# Patient Record
Sex: Male | Born: 1945 | Race: White | Hispanic: No | Marital: Married | Smoking: Former smoker
Health system: Southern US, Community
[De-identification: ages and names within clinical notes are randomized; demographics above are authoritative.]

## PROBLEM LIST (undated history)

## (undated) DIAGNOSIS — C801 Malignant (primary) neoplasm, unspecified: Secondary | ICD-10-CM

## (undated) DIAGNOSIS — I499 Cardiac arrhythmia, unspecified: Secondary | ICD-10-CM

## (undated) DIAGNOSIS — F4310 Post-traumatic stress disorder, unspecified: Secondary | ICD-10-CM

## (undated) DIAGNOSIS — Z87442 Personal history of urinary calculi: Secondary | ICD-10-CM

## (undated) DIAGNOSIS — M1990 Unspecified osteoarthritis, unspecified site: Secondary | ICD-10-CM

## (undated) DIAGNOSIS — I480 Paroxysmal atrial fibrillation: Secondary | ICD-10-CM

## (undated) DIAGNOSIS — I2510 Atherosclerotic heart disease of native coronary artery without angina pectoris: Secondary | ICD-10-CM

## (undated) HISTORY — DX: Paroxysmal atrial fibrillation: I48.0

## (undated) HISTORY — DX: Atherosclerotic heart disease of native coronary artery without angina pectoris: I25.10

## (undated) HISTORY — PX: BACK SURGERY: SHX140

## (undated) HISTORY — PX: CARPAL TUNNEL RELEASE: SHX101

## (undated) HISTORY — PX: JOINT REPLACEMENT: SHX530

---

## 2001-05-15 ENCOUNTER — Encounter: Admission: RE | Admit: 2001-05-15 | Discharge: 2001-05-15 | Payer: Self-pay | Admitting: Orthopaedic Surgery

## 2001-05-15 ENCOUNTER — Encounter: Payer: Self-pay | Admitting: Orthopaedic Surgery

## 2001-05-30 ENCOUNTER — Encounter: Admission: RE | Admit: 2001-05-30 | Discharge: 2001-05-30 | Payer: Self-pay | Admitting: Orthopaedic Surgery

## 2001-05-30 ENCOUNTER — Encounter: Payer: Self-pay | Admitting: Orthopaedic Surgery

## 2001-06-12 ENCOUNTER — Encounter: Payer: Self-pay | Admitting: Orthopaedic Surgery

## 2001-06-12 ENCOUNTER — Encounter: Admission: RE | Admit: 2001-06-12 | Discharge: 2001-06-12 | Payer: Self-pay | Admitting: Orthopaedic Surgery

## 2001-09-11 ENCOUNTER — Ambulatory Visit (HOSPITAL_COMMUNITY): Admission: RE | Admit: 2001-09-11 | Discharge: 2001-09-11 | Payer: Self-pay | Admitting: Orthopaedic Surgery

## 2001-11-20 ENCOUNTER — Inpatient Hospital Stay (HOSPITAL_COMMUNITY): Admission: RE | Admit: 2001-11-20 | Discharge: 2001-11-21 | Payer: Self-pay | Admitting: Neurosurgery

## 2001-11-20 ENCOUNTER — Encounter: Payer: Self-pay | Admitting: Neurosurgery

## 2003-10-21 ENCOUNTER — Inpatient Hospital Stay (HOSPITAL_COMMUNITY): Admission: RE | Admit: 2003-10-21 | Discharge: 2003-10-23 | Payer: Self-pay | Admitting: Orthopaedic Surgery

## 2003-12-06 ENCOUNTER — Emergency Department (HOSPITAL_COMMUNITY): Admission: EM | Admit: 2003-12-06 | Discharge: 2003-12-06 | Payer: Self-pay | Admitting: Emergency Medicine

## 2010-01-18 ENCOUNTER — Encounter: Admission: RE | Admit: 2010-01-18 | Discharge: 2010-01-18 | Payer: Self-pay | Admitting: Orthopaedic Surgery

## 2010-05-07 HISTORY — PX: SHOULDER ARTHROSCOPY W/ ROTATOR CUFF REPAIR: SHX2400

## 2010-09-22 NOTE — Op Note (Signed)
[place]  Patient:    [name], [name] Visit Number: [id number] MRN: [id number]          Service Type: SUR Location: [id number] Attending Physician:  Barton Fanny Dictated by:   Hewitt Shorts, M.D. Proc. Date: 11/20/01 Admit Date:  11/20/2001 Discharge Date: 11/21/2001                             Operative Report  PREOPERATIVE DIAGNOSIS:  Lumbar stenosis.  POSTOPERATIVE DIAGNOSIS:  Lumbar stenosis and lumbar synovial cyst.  PROCEDURE:  L4 and L5 lumbar laminectomies, resection of lumbar synovial cyst, and microdissection.  SURGEON:  Hewitt Shorts, M.D.  ASSISTANT:  Cristi Loron, M.D.  ANESTHESIA:  General endotracheal.  INDICATIONS:  The patient is a 65 year old man who presented with neurogenic claudication.  He was found by MRI scan to have evidence of lumbar stenosis. He failed conservative treatment and was admitted for surgical decompression. Intraoperatively, in addition to spinal leak degeneration, we did find a synovial cyst on the left side contributing to the stenosis and this was removed.  DESCRIPTION OF PROCEDURE:  The patient was brought to the operating room and placed under general endotracheal anesthesia.  The patient was turned to a prone position, the lumbar region prepped with Betadine soap and solution, and draped in a sterile fashion.  A vertical midline incision was made.  The line of the incision was infiltrated with local anesthetic with epinephrine. Dissection was carried down through the subcutaneous tissue and bipolar cautery and electrocautery were used to maintain hemostasis.  Dissection was carried down to the lumbar fascia which was incised bilaterally with the paraspinous muscles of the spinous process and lamina in a subperiosteal fashion.  An x-ray was taken to localize the L4 and L5 spinous process and lamina, and then a laminectomy was performed using double action  rongeurs with the black Max drill and Kerrison punches.  We found the ligamentum flavum to be thickened.  The microscope was draped and brought into the field to provide instant magnification, illumination, and visualization, and the decompression of the thecal sac was performed with microsurgical and microdissection technique.  Both ligament and bone was removed on the left side.  We encountered a synovial cyst that was carefully removed and foraminotomies were performed bilaterally to the L5 nerve root.  There was moderate epidural scarring, probably due to the epidural steroid injections she had previously undergone.  Once the laminectomy and resection of the synovial cyst and decompression of the stenosis was completed, the wound was irrigated with Bacitracin solution, and checked for hemostasis.  The laminectomy defect was closed with Gelfoam soaked in thrombin.  Once hemostasis was established and confirmed, we proceeded with closure.  The paraspinal muscles were approximated with interrupted undyed 1 Vicryl sutures, the deep fascia closed with interrupted undyed 1 Vicryl suture, the subcutaneous and subcuticular closed with interrupted and inverted 2-0 undyed Vicryl sutures, and the skin was reapproximated with Dermabond.  The patient tolerated the procedure well.  Estimated blood loss was 100 cc. Sponge and needle count correct.  Following surgery, the patient was returned back to the supine position to be reversed from the anesthetic, extubated, and transferred to the recovery room for further care. Dictated by:   Hewitt Shorts, M.D. Attending Physician:  Barton Fanny DD:  11/20/01 TD:  11/25/01 Job: [id number]

## 2010-09-22 NOTE — H&P (Signed)
NAME:  [name], [name]                        ACCOUNT NO.:  [id number]   MEDICAL RECORD NO.:  [id number]                   PATIENT TYPE:  INP   LOCATION:                                       FACILITY:  [facility]   PHYSICIAN:  Legrand Pitts. Duffy, P.A.                DATE OF BIRTH:  09-14-1945   DATE OF ADMISSION:  10/21/2003  DATE OF DISCHARGE:                                HISTORY & PHYSICAL   CHIEF COMPLAINT:  Left knee pain for the last two years.   HISTORY OF PRESENT ILLNESS:  This 65 year old white male patient presents to  Dr. Claude Manges. [place] with a two-year history of gradual onset of  progressively worsening left knee pain.  He has a history of a left-knee  arthroscopy by Dr. Cleophas Dunker on March 25 at 2004.  He had arthritic changes  noted in the knee at that time.  Since that surgery, the pain in the knee  has gotten worse.  He is also having more difficulty with range of motion  and activities that he wishes to proceed with.   He has had no known injury to his knee.  The pain at this time is a diffuse,  constant throbbing or burning sensation diffuse about the joint with some  radiation into the proximal tibia.  The pain increases with prolonged  standing, and decreases with elevation of the knee.  The knee does pop and  swell.  He is having some difficulty falling asleep at times.  The knee also  has locked up on him once or twice.  There is no grinding, catching or  giving away of the knee.  He does not ambulate with any assistive devices.  He takes Aleve p.r.n. for the pain, and that provides some relief.  He has  received cortisone injections and Hyalgan injections in the past, and he  thought the Hyalgan even made the knee worse, whereas the cortisone did  allow some momentary pain relief.   ALLERGIES:  No known drug allergies.   CURRENT MEDICATIONS:  1. Aleve two to three tablets p.r.n. pain.  2. Multivitamin two tablets p.o. q.a.m.  3. Chondroitin glucosamine, three  tablets p.o. q.a.m.  4. Calcium and magnesium one tablet p.o. b.i.d.  5. Fish oil, one capsule p.o. q.a.m.  6. Antioxidant vitamin, one tablet p.o. q.a.m.  7. Vitamin B complex, two tablets p.o. q.a.m.  8. Juice Plus, one capsule p.o. q.a.m.  9. Aspirin 81 mg, one tablet p.o. q.a.m.   PAST MEDICAL HISTORY:  He denies any history of diabetes mellitus,  hypertension, thyroid disease, hiatal hernia, reflux, peptic ulcer disease,  heart disease, asthma or any other chronic medical condition.   PAST SURGICAL HISTORY:  1. Left knee arthroscopy, July 30, 2002, by Dr. Claude Manges. Whitfield.  2. Lumbar laminectomy in 2002 by Dr. Hewitt Shorts.  3. He denies any complications from the above-mentioned  procedures.   SOCIAL HISTORY:  He smokes about one-third pack of cigarettes a day and has  done that on and off for the last 20 years.  He does not drink any alcohol  nor use any drugs.  He is separated.  He lives with a friend in a second  floor apartment with 15 to 20 steps up to his apartment.  He does have three  children.  He current works and owns his own [employer].   MEDICAL DOCTOR:  Dr. Erskine Speed.   FAMILY HISTORY:  His mother died at the age of 12 with hypertension, lung  cancer and nerve problems.  Father died at the age of 59 with kidney  failure.  He had one brother who died with drug abuse, one sister who died  with a boating accident.  He does have a 19 year old brother who is an  alcoholic, and a 59 year old sister who is healthy.  The last two siblings  are alive.  His children are two sons ages 61 and 62, and a daughter age 2  who are all alive and well.   REVIEW OF SYSTEMS:  He does have occasional bleeding gums and reports he  does have some gum disease.  At age 55, he did have a mild case of rheumatic  fever, but no complications from that.  He did develop malaria in 1966  requiring hospitalization.  He does wear glasses for reading.  He does not  have  a living will, and his power of attorney is his separated wife, [name].  All other systems are negative and noncontributory.   PHYSICAL EXAMINATION:  GENERAL:  A well-developed, well-nourished white  male, in no acute distress.  He walks with a slight limp.  Mood and affect  are appropriate.  He talks easily with the examiner.  VITAL SIGNS:  Height 6 feet, 2 inches.  W eight 230 pounds.  BMI is 28.5.  Temperature 97.5 degrees Fahrenheit, pulse 56, respirations 16, blood  pressure 126/60.  HEENT:  Normocephalic, atraumatic without frontal or maxillary sinus  tenderness to palpitations.  Conjunctivae pink.  Sclerae anicteric. PERRLA/  EOM's are intact.  No visible external ear deformities.  Hearing is grossly  intact.  Tympanic membranes are pearly gray bilateral with good light  reflex.  Nose and nasal septum is midline.  Nasal mucosa is pink and moist  without exudates or polyps noted.  Buccal mucosa is pink and moist.  Good  dentition.  Pharynx is without erythema or exudate.  Tongue and uvula are  midline.  Tongue is without fasciculations, and uvula arises equally with  phonation.  NECK:  No visible masses or lesions are noted.  Trachea is midline, no  palpable lymphadenopathy, no thyromegaly.  Carotids are +2 bilaterally  without bruits.  Full range of motion of the cervical spine without pain.  CARDIOVASCULAR:  Heart rate and rhythm are regular.  S1 and S2 present  without rubs, clicks or murmurs noted.  RESPIRATORY:  Respirations are even and unlabored.  Breath sounds are clear  to auscultation bilaterally without rales or wheezes noted.  ABDOMEN:  Rounded abdominal contour.  Bowel sounds are present x4 quadrants.  Soft, nontender to palpation without hepatosplenomegaly nor CVA tenderness.  PULSES:  Femoral pulses +1 bilaterally, nontender to palpation along the  vertebral column. BREASTS/GU/RECTAL:  These exams are deferred at this time.  MUSCULOSKELETAL:  No  obvious deformities bilateral upper extremities with  full range of motion  of these extremities without pain.  Radial pulses +2  bilaterally.  EXTREMITIES:  Full range of motion of his hips, ankles and toes bilaterally.  The DP and PT pulses are +2.  No lower-extremity edema.  No calf pain with  palpation and negative Homan's sign bilaterally.  Right knee:  Skin is  intact without erythema or ecchymosis. He has full extension and flexion to  135 degrees with minimal crepitance.  There is no pain with palpation along  the joint line and no effusion in the knee.  He is stable to varus and  valgus stress and has a negative anterior drawer.  Left knee has full  extension and flexion to 110 degrees with a moderate amount of crepitance.  There is a +2 to 3 effusion in the knee, and he does have pain with  palpation along the medial joint line.  None really laterally.  He is stable  to varus and valgus stress and has a negative anterior drawer.  NEUROLOGIC:  He is alert and oriented x3.  Cranial nerves II-XII are grossly  intact.  Strength 5/5 bilateral upper and lower extremities.  Rapid  alternating movements are intact.  Deep tendon reflexes are 2+ bilateral  upper and lower extremities.  Sensation is intact to light touch.  Rapid  alternating movements intact.   RADIOLOGIC FINDINGS:  X-rays taken of his left knee in April of 2005 show  bone-on-bone contact in the medial compartment of the knee with significant  spurring in the patellofemoral joint.   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   PLAN:  Mr. Hauser will be admitted to [facility] on October 21, 2003,  where he will undergo a left total knee arthroplasty by Dr. Claude Manges.  [place].  He will undergo all the routine preoperative laboratory testing  studies prior to the procedure.                                                Legrand Pitts Duffy, P.A.    KED/MEDQ  D:   10/13/2003  T:  10/14/2003  Job:  [id number]

## 2010-09-22 NOTE — Discharge Summary (Signed)
NAME:  [name], [name]                        ACCOUNT NO.:  [id number]   MEDICAL RECORD NO.:  [id number]                   PATIENT TYPE:  INP   LOCATION:  5019                                 FACILITY:  [facility]   PHYSICIAN:  Claude Manges. Cleophas Dunker, M.D.            DATE OF BIRTH:  11/13/1945   DATE OF ADMISSION:  10/21/2003  DATE OF DISCHARGE:  10/23/2003                                 DISCHARGE SUMMARY   ADMISSION DIAGNOSIS:  End-stage osteoarthritis, left knee.   DISCHARGE DIAGNOSIS:  Left total knee arthroplasty.   HISTORY OF PRESENT ILLNESS:  The patient is a 65 year old white male who  presents to Dr. Cleophas Dunker with two-year history of gradual onset with  progressively worsening left knee pain.  He does have history of arthroscopy  in 2004.  Arthritic changes were noted at the knee at that time.  The  patient has no recent injury to the knee.  He has diffuse constant  throbbing, burning sensation about the knee and into the proximal tibia.  It  worsens with prolonged standing and active activity.  He does have popping  and swelling.  Does have difficulty with sleep at night.  Does occasionally  lock up.   ALLERGIES:  No known drug allergies.   CURRENT MEDICATIONS:  1. Aleve p.r.n.  2. Multivitamin two tablets daily.  3. Glucosamine Chondroitin p.r.n.  4. Calcium and magnesium one tablet p.o. b.i.d.  5. Fish oil.  6. Antioxidant vitamins.  7. Vitamin B complex.  8. Juice Plus one tablet p.o. daily.  9. Aspirin 81 mg p.o. daily.   SURGICAL PROCEDURE:  On October 21, 2003 the patient was taken to the OR by Dr.  [name], M.D., assisted by Richardean Canal, P.A.-C.  Under general  anesthesia, the patient underwent a left total knee arthroplasty with  subsequent femoral nerve block.  The patient had the following components  implanted.  A left large femoral component, a size 5 tibial tray, large  patella component.  All components were implanted polymethylmethacrylate and  the  patient tolerated the procedure well and there were no complications.  The patient was transferred to recovery room, then to the orthopedic floor  for routine total knee protocol.   CONSULTATIONS:  Following routine consults requested:  Physical therapy and  case management.   HOSPITAL COURSE:  On October 21, 2003 the patient was admitted to [facility] under the care of Dr. [name], M.D.  The patient was taken  to the OR where a left total knee arthroplasty was performed without any  complications.  The patient received a postoperative femoral nerve block and  the patient was transferred to recovery room and then to orthopedic floor  for routine postoperative total knee protocol.  The patient was placed on  Coumadin for routine DVT prophylaxis.  The patient worked well with physical  therapy.  His wound remained benign for any signs of infection.  The leg  remained neuromotor, vascularly intact.  Vital signs remained stable and he  remained afebrile and the patient was very eager and ready to be discharged  home on postoperative day #2 with routine outpatient home health physical  therapy protocol.  The patient was discharged in good condition.   LABORATORY DATA:  EKG on admission was normal sinus rhythm with occasional  premature supraventricular complex at 64 beats/minute.   Chest showed negative for active disease.  Findings suggestive of an element  of obstructive lung disease.   CBC on June 18:  WBC 8.7, hemoglobin 9.9, hematocrit 28.4, platelets 181,  INR of 1.4.  Routine chemistries:  Sodium 133, potassium 3.8, glucose 118  labile from 135 high, felt to be routine postoperative lack of activity, BUN  7, creatinine 0.9.  Routine urinalysis on admission was normal.   MEDICATIONS UPON DISCHARGE FROM ORTHO FLOOR:  1. Heparin 5000 units subcu q.12h until Coumadin therapeutic.  2. Colace 100 mg p.o. b.i.d.  3. Trinsicon one tablet p.o. t.i.d.  4. Laxative or enema  of choice p.r.n.  5. Percocet one or two tablets every four to six hours p.r.n.  6. Tylenol 650 mg p.o. q.4h p.r.n.  7. Reglan 10 mg p.o. q.8h p.r.n.  8. Calcium carbonate 500 mg p.o. b.i.d.  9. Multivitamin with minerals two tablets p.o. daily.  10.      Magnesium oxide 400 mg p.o. b.i.d.  11.      Skelaxin one or two tablets every four to six hours p.r.n.  12.      Coumadin 7.5 mg  p.o. daily.   DISCHARGE INSTRUCTIONS:   MEDICATIONS:  The patient is to resume routine home meds.  1. Percocet 5 mg one or two tablets every four to six hours for pain if     needed.  2. Coumadin 7.5 mg  1 1/2 5 mg tablets a day unless changed by home health     RN and pharmacy by [place].   ACTIVITY:  Weightbearing 50% with the use of a walker as tolerated.   DIET:  No restrictions.   WOUND CARE:  Change dressing daily.   [name] home RN and physical therapy.   FOLLOW UP:  The patient is to call for followup appointment with Dr.  Cleophas Dunker for June 27.   The patient's condition upon discharge to home is listed improved and good.      Jamelle Rushing, P.A.                      Claude Manges. Cleophas Dunker, M.D.    RWK/MEDQ  D:  12/03/2003  T:  12/03/2003  Job:  [id number]

## 2010-09-22 NOTE — Op Note (Signed)
NAMEKELSO, BIBBY                        ACCOUNT NO.:  [id number]   MEDICAL RECORD NO.:  [id number]                   PATIENT TYPE:  INP   LOCATION:  2871                                 FACILITY:  [facility]   PHYSICIAN:  Claude Manges. Cleophas Dunker, M.D.            DATE OF BIRTH:  Jan 10, 1946   DATE OF PROCEDURE:  10/21/2003  DATE OF DISCHARGE:                                 OPERATIVE REPORT   PREOPERATIVE DIAGNOSIS:  End stage osteoarthritis, left knee.   POSTOPERATIVE DIAGNOSIS:  End stage osteoarthritis, left knee.   PROCEDURE:  Left total knee replacement.   SURGEON:  Claude Manges. Cleophas Dunker, M.D.   ASSISTANT:  Richardean Canal, P.A.-C.   ANESTHESIA:  General orotracheal with supplemented femoral nerve block.   COMPONENTS:  DePuy LCS large femoral component, #5 rotating keel tibia  platform with a , 12.5 mm bridging bearing, and a 3 peg metal back rotating  patella, all were secured with polymethyl methacrylate.   PROCEDURE:  With the patient comfortable on the operating table and under  general endotracheal anesthesia, the nursing staff inserted a Foley catheter  without difficulty.  A thigh tourniquet was then applied to the left lower  extremity.  The leg was then prepped with Betadine scrub and then DuraPrep  from the tourniquet to the hindfoot, sterile draping was performed.  With  the extremity still elevated, it was Esmarch exsanguinated with a proximal  tourniquet at 350 mmHg.  A midline longitudinal incision was made centered  about the patella and by sharp dissection, carried down to the subcutaneous  tissue.  The first layer of capsule was incised in the midline.  A medial  parapatellar incision was made with the Bovie.  There was a large clear  yellow joint effusion.  The patella was everted 180 degrees.  The knee was  then flexed to 90 degrees.  There were large osteophytes along the medial  femoral condyle with complete absence of articular cartilage and, to a  lesser  extent, osteophytes along the lateral femoral condyle.  There was  also a very large synovial hypertrophy that, undoubtedly, was causing the  recurrent effusion.  I did a very selective and fastidious synovectomy with  the Bovie.   Preoperatively, we had measured a large femoral and a #5 tibial tray and  these were confirmed interoperatively.  The initial cut was made in the  tibia transversely with the posterior angle of inclination at 7 degrees.  Femoral cuts were then made with the distal femoral osteotomies at 4 degrees  of valgus.  Laminar spreaders were inserted to remove medial and lateral  menisci and any remnants of ACL and PCL.  Osteophytes were removed from the  posterior femoral condyles bilaterally and, again, a synovectomy was  performed at the intercondylar notch as well as posterior to the femoral  condyles.  The MCL and ACL remained intact.  Flexion and extension gaps were  perfectly symmetrical  at 12.5.  The retractors were then placed about the  tibia.  The final tibial cuts were made to accept the keeled portion.  The  #5 tibial tray fit nicely and the 12.5 mm polyethylene trial bearing was  then inserted followed by the femur.  The trial components were then  reduced.  The knee was then placed through a full range of motion with  excellent position, there was no malrotation of any of the components.   The patella measured 26.5 mm in width.  We replaced a 12 and 12 mm of  patellar width were removed with the oscillating saw.  Three holes were then  made in the patella and the trial patella applied.  The knee was reduced and  the knee placed through a full range of motion without any stability.  The  trial components were removed.  The joint was copiously irrigated with jet  saline.  The final components were inserted with polymethyl methacrylate.  Excess methacrylate was removed with a Glorious Peach.  After complete maturation of  the methacrylate, the joint was inspected and  any remaining hardened  methacrylate was removed with an osteotome.  The joint was again copiously  irrigated with jet saline and antibiotics.  The knee was placed through a  full range of motion without any loss of position.   The tourniquet was deflated with immediate capillary refill to the wound  edges and the joint.  Gross bleeders were Bovie coagulated.  Bone wax was  applied to bleeding bone surfaces.  We had a nice dry field before  initiation of closure.  The deep capsule was closed with interrupted #1  Ethibond.  The superficial capsule was closed with a running 0 Vicryl, the  subcu was closed with 2-0 Vicryl, and the skin was closed with skin clips.  A sterile, bulky dressing was applied followed by the patient's support  stocking.  The patient tolerated the procedure without complications.  He  was to receive a postoperative femoral nerve block by Dr. Michelle Piper.                                               Claude Manges. Cleophas Dunker, M.D.    PWW/MEDQ  D:  10/21/2003  T:  10/21/2003  Job:  [id number]

## 2011-07-20 DIAGNOSIS — M1710 Unilateral primary osteoarthritis, unspecified knee: Secondary | ICD-10-CM | POA: Diagnosis not present

## 2011-07-21 DIAGNOSIS — F4001 Agoraphobia with panic disorder: Secondary | ICD-10-CM | POA: Diagnosis not present

## 2011-07-21 DIAGNOSIS — F4310 Post-traumatic stress disorder, unspecified: Secondary | ICD-10-CM | POA: Diagnosis not present

## 2011-07-21 DIAGNOSIS — F329 Major depressive disorder, single episode, unspecified: Secondary | ICD-10-CM | POA: Diagnosis not present

## 2011-08-13 ENCOUNTER — Encounter (HOSPITAL_COMMUNITY): Payer: Self-pay | Admitting: Pharmacy Technician

## 2011-08-15 DIAGNOSIS — M1710 Unilateral primary osteoarthritis, unspecified knee: Secondary | ICD-10-CM | POA: Diagnosis not present

## 2011-08-18 NOTE — Pre-Procedure Instructions (Signed)
20 [name]  08/18/2011   Your procedure is scheduled on:  Tues, April 23 @ 1025 AM  Report to [facility] at 0825 AM.  Call this number if you have problems the morning of surgery: [phone]   Remember:   Do not eat food:After Midnight.  May have clear liquids: up to 4 Hours before arrival.(until 4:25 am)  Clear liquids include soda, tea, black coffee, apple or grape juice, broth,water  Take these medicines the morning of surgery with A SIP OF WATER:    Do not wear jewelry  Do not wear lotions, powders, or perfumes.   Do not shave 48 hours prior to surgery.  Do not bring valuables to the hospital.  Contacts, dentures or bridgework may not be worn into surgery.  Leave suitcase in the car. After surgery it may be brought to your room.  For patients admitted to the hospital, checkout time is 11:00 AM the day of discharge.   Special Instructions: Incentive Spirometry - Practice and bring it with you on the day of surgery. and CHG Shower Use Special Wash: 1/2 bottle night before surgery and 1/2 bottle morning of surgery.   Please read over the following fact sheets that you were given: Pain Booklet, Coughing and Deep Breathing, Blood Transfusion Information, Total Joint Packet, MRSA Information and Surgical Site Infection Prevention

## 2011-08-20 ENCOUNTER — Ambulatory Visit (HOSPITAL_COMMUNITY)
Admission: RE | Admit: 2011-08-20 | Discharge: 2011-08-20 | Disposition: A | Discharge status: Home / Self Care | Payer: Medicare Other | Source: Ambulatory Visit | Attending: Orthopedic Surgery | Admitting: Orthopedic Surgery

## 2011-08-20 ENCOUNTER — Encounter (HOSPITAL_COMMUNITY): Payer: Self-pay

## 2011-08-20 ENCOUNTER — Encounter (HOSPITAL_COMMUNITY)
Admission: RE | Admit: 2011-08-20 | Discharge: 2011-08-20 | Disposition: A | Discharge status: Home / Self Care | Payer: Medicare Other | Source: Ambulatory Visit | Attending: Orthopaedic Surgery | Admitting: Orthopaedic Surgery

## 2011-08-20 ENCOUNTER — Encounter (HOSPITAL_COMMUNITY): Payer: Self-pay | Admitting: Pharmacy Technician

## 2011-08-20 DIAGNOSIS — IMO0002 Reserved for concepts with insufficient information to code with codable children: Secondary | ICD-10-CM | POA: Diagnosis not present

## 2011-08-20 DIAGNOSIS — Z01818 Encounter for other preprocedural examination: Secondary | ICD-10-CM | POA: Insufficient documentation

## 2011-08-20 DIAGNOSIS — M479 Spondylosis, unspecified: Secondary | ICD-10-CM | POA: Diagnosis not present

## 2011-08-20 DIAGNOSIS — Z01811 Encounter for preprocedural respiratory examination: Secondary | ICD-10-CM | POA: Diagnosis not present

## 2011-08-20 DIAGNOSIS — Z87891 Personal history of nicotine dependence: Secondary | ICD-10-CM | POA: Insufficient documentation

## 2011-08-20 DIAGNOSIS — Z01810 Encounter for preprocedural cardiovascular examination: Secondary | ICD-10-CM | POA: Insufficient documentation

## 2011-08-20 DIAGNOSIS — Z01812 Encounter for preprocedural laboratory examination: Secondary | ICD-10-CM | POA: Insufficient documentation

## 2011-08-20 HISTORY — DX: Unspecified osteoarthritis, unspecified site: M19.90

## 2011-08-20 LAB — URINALYSIS, ROUTINE W REFLEX MICROSCOPIC
Bilirubin Urine: NEGATIVE
Nitrite: NEGATIVE
Specific Gravity, Urine: 1.004 — ABNORMAL LOW (ref 1.005–1.030)
Urobilinogen, UA: 1 mg/dL (ref 0.0–1.0)

## 2011-08-20 LAB — CBC
HCT: 43.2 % (ref 39.0–52.0)
Hemoglobin: 13.8 g/dL (ref 13.0–17.0)
MCV: 79.3 fL (ref 78.0–100.0)
RDW: 15.4 % (ref 11.5–15.5)
WBC: 7.4 10*3/uL (ref 4.0–10.5)

## 2011-08-20 LAB — APTT: aPTT: 34 seconds (ref 24–37)

## 2011-08-20 LAB — TYPE AND SCREEN: Antibody Screen: NEGATIVE

## 2011-08-20 LAB — DIFFERENTIAL
Eosinophils Relative: 5 % (ref 0–5)
Lymphocytes Relative: 28 % (ref 12–46)
Lymphs Abs: 2.1 10*3/uL (ref 0.7–4.0)
Monocytes Absolute: 0.6 10*3/uL (ref 0.1–1.0)

## 2011-08-20 LAB — COMPREHENSIVE METABOLIC PANEL
ALT: 23 U/L (ref 0–53)
AST: 26 U/L (ref 0–37)
CO2: 25 mEq/L (ref 19–32)
Calcium: 10.4 mg/dL (ref 8.4–10.5)
Chloride: 104 mEq/L (ref 96–112)
Potassium: 4.2 mEq/L (ref 3.5–5.1)
Total Protein: 6.9 g/dL (ref 6.0–8.3)

## 2011-08-20 LAB — SURGICAL PCR SCREEN: Staphylococcus aureus: NEGATIVE

## 2011-08-20 MED ORDER — CHLORHEXIDINE GLUCONATE 4 % EX LIQD: 60.0000 mL | Freq: Every day | CUTANEOUS | Status: DC

## 2011-08-21 LAB — URINE CULTURE

## 2011-08-22 ENCOUNTER — Inpatient Hospital Stay (HOSPITAL_COMMUNITY): Admission: RE | Admit: 2011-08-22 | Payer: Self-pay | Source: Ambulatory Visit

## 2011-08-22 NOTE — Consult Note (Signed)
Anesthesia Chart Review:  Patient is a 66 year old male scheduled for right TKR on 08/28/11.  History includes smoking,arthritis, prior back, shoulder (05/2010), and left TKR '05.  Labs reviewed.    08/20/11 CXR shows Generalized hyperinflation consistent with obstructive pulmonary disease. No acute superimposed abnormality is evident. Stable chronic findings are detailed above.   08/20/11 EKG shows NSR with sinus arrhythmia, LAD, possible anterior infarct (age undetermined), inferior infarct (age undetermined).  Loss of his r wave in aVF appears new since 10/18/03.  I called and spoke with [name].  He denies CP, SOB.  He was able to play tennis up to two weeks ago due to knee pain.  He has knee swelling, but otherwise denies significant edema.  His PCP is Dr. Nila Nephew. He also gets some care at the [facility].  He denies prior cardiac testing other than an EKG.  His office does not have any more recent EKGs since 2005. I reviewed above with Anesthesiologist Dr. Ivin Booty.  Rec: Cardiology evaluation preoperatively due to abnormal EKG.  Keri at Dr. Hoy Register office has been notified.

## 2011-08-24 ENCOUNTER — Other Ambulatory Visit (HOSPITAL_COMMUNITY): Payer: Self-pay | Admitting: Cardiology

## 2011-08-24 DIAGNOSIS — Z0389 Encounter for observation for other suspected diseases and conditions ruled out: Secondary | ICD-10-CM | POA: Diagnosis not present

## 2011-08-24 DIAGNOSIS — Z01818 Encounter for other preprocedural examination: Secondary | ICD-10-CM | POA: Diagnosis not present

## 2011-08-24 DIAGNOSIS — R9431 Abnormal electrocardiogram [ECG] [EKG]: Secondary | ICD-10-CM

## 2011-08-24 DIAGNOSIS — Z01810 Encounter for preprocedural cardiovascular examination: Secondary | ICD-10-CM | POA: Diagnosis not present

## 2011-08-27 ENCOUNTER — Ambulatory Visit (HOSPITAL_COMMUNITY)
Admission: RE | Admit: 2011-08-27 | Discharge: 2011-08-27 | Disposition: A | Discharge status: Home / Self Care | Payer: Medicare Other | Source: Ambulatory Visit | Attending: Cardiology | Admitting: Cardiology

## 2011-08-27 ENCOUNTER — Encounter (HOSPITAL_COMMUNITY)
Admission: RE | Admit: 2011-08-27 | Discharge: 2011-08-27 | Disposition: A | Discharge status: Home / Self Care | Payer: Medicare Other | Source: Ambulatory Visit | Attending: Cardiology | Admitting: Cardiology

## 2011-08-27 DIAGNOSIS — Z01810 Encounter for preprocedural cardiovascular examination: Secondary | ICD-10-CM | POA: Diagnosis not present

## 2011-08-27 DIAGNOSIS — R9431 Abnormal electrocardiogram [ECG] [EKG]: Secondary | ICD-10-CM

## 2011-08-27 DIAGNOSIS — I4949 Other premature depolarization: Secondary | ICD-10-CM | POA: Insufficient documentation

## 2011-08-27 MED ORDER — TECHNETIUM TC 99M TETROFOSMIN IV KIT
30.0000 | PACK | Freq: Once | INTRAVENOUS | Status: AC | PRN
  Administered 2011-08-27: 30 via INTRAVENOUS

## 2011-08-27 MED ORDER — CEFAZOLIN SODIUM-DEXTROSE 2-3 GM-% IV SOLR
2.0000 g | INTRAVENOUS | Status: AC
  Administered 2011-08-28: 2 g via INTRAVENOUS
  Filled 2011-08-27: qty 50

## 2011-08-27 MED ORDER — REGADENOSON 0.4 MG/5ML IV SOLN
0.4000 mg | Freq: Once | INTRAVENOUS | Status: AC
  Administered 2011-08-27: 0.4 mg via INTRAVENOUS

## 2011-08-27 MED ORDER — REGADENOSON 0.4 MG/5ML IV SOLN
INTRAVENOUS | Status: AC
  Filled 2011-08-27: qty 5

## 2011-08-27 MED ORDER — TECHNETIUM TC 99M TETROFOSMIN IV KIT
10.0000 | PACK | Freq: Once | INTRAVENOUS | Status: AC | PRN
  Administered 2011-08-27: 10 via INTRAVENOUS

## 2011-08-27 NOTE — H&P (Signed)
COMPLAINT:     Painful right knee.  HPI:     [name] is a very pleasant 66 year old white male who is seen today for evaluation of his right knee.  He has been having chronic problems with the right knee and has had multiple cortisone injections, anti-inflammatory medications, and viscosupplementation.  These have failed recently and he is now to the point where he is having rather significant severe pain in the right knee which is affecting his ambulation as well as his activities of daily living.  He now has pain which is pretty much constant, throbbing, and aching, and occasional sharpness to it.  He is now having difficulty with sleeping.  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  Rest has been helpful.  He is seen today for reevaluation.  MEDICATIONS:  None.  ALLERGIES:  NKDA.  PAST MEDICAL HISTORY:   His general health is good.  HOSPITALIZATIONS:   Laminectomy of the lumbar spine in 2000, left total knee arthroplasty in 2005, and right shoulder arthroscopic debridement with subacromial decompression, distal clavicle excision, and mini open rotator cuff repair in 2012.    SURGICAL HISTORY:  As above.  ROS:     Fourteen point review of systems is negative except for tinnitus.  He also has intermittent hemorrhoids.  He does have kidney stones in the past with the last occurrence being in 2003.  He has had several treatments for that including lithotripsy.  He drinks a lot of water and so he urinates frequently during the day and two to three times at night.  He is trying to keep his kidneys flushed to prevent further stones.  FAMILY HISTORY:  Mother has history of cancer.  Father has renal disease.  SOCIAL HISTORY:  [name] is a 66 year old white married male.  He is self-employed as a Photographer.  He smokes  PPD x 20 years.  He does not drink alcohol.  EXAM:     Examination today reveals  a 66 year old white male who is well developed, well nourished, alert, pleasant, and cooperative.  He is in moderate distress secondary to right knee pain.  Height 6 feet, 2 inches.  Weight 228 pounds.  BMI 29.3.     Temp 97.7 degrees.  Pulse 58.  RR 16.  BP 120/77.  Head:  Normocephalic. Eyes:  PERRLA.  EOMs intact. Neck:  Supple.  No bruits noted. Chest:  Good expansion. Lungs:  Essentially clear to auscultation. Cardiac:  Regular rhythm, rate.  Normal S1-S2.  No murmurs, rubs, or gallops appreciated. Pulses: 1+ bilateral and symmetric in lower extremities. Abdomen:  Scaphoid soft nontender, no masses palpable, normal bowel sounds present. CNS:  Oriented x 3.  Cranial nerves 2-12 grossly intact. Genital, rectal, breast:  Not indicated for orthopedic evaluation. Musculoskeletal:  Today he has range of motion from around 3 degrees to 105 degrees.  He does  have a small effusion.  He is tender more medial than lateral.  Some pseudolaxity at the medial  joint line.  He does have some varus noted.      X-RAYS:     X-rays are reviewed showing marked narrowing of the medial joint space.  He does have some intercondylar spurring noted.  There may even be a loose body noted in the intercondylar notch versus a spur.  He has marked irregularity of the joint laterally.  There are cysts in the  metaphyseal area of the proximal tibia both medially and laterally.  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  There is periarticular spurring also in the patellofemoral joint. Good bone quality.  MYOVIEW STRESS TEST: Findings: Multiplanar SPECT imaging shows no fixed or reversible  perfusion defect within the left ventricular myocardium. Left  ventricular cavity size appears grossly normal.  Imaging obtained with a cardiac gating displayed using a surface  rendering algorithm shows no segmental wall motion abnormality.  Left ventricular end-diastolic volume is calculated at 83 ml.  Left  ventricular end systolic volume is calculated 29 ml. The derived  left ventricular ejection fraction is 65%.  IMPRESSION:  No evidence for inducible ischemia.  Normal left ventricular ejection fraction.   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         H/O renal calculi  RECOMMENDATIONS:      1. At this time, because of his pain, discomfort, and failure with conservative treatment we feel that he is a candidate for a right total knee arthroplasty.  2. The procedure and the risks and benefits have been explained to the patient in detail and he is understanding.  He would like to proceed with this.  I have answered all his questions.  He already has his medications that he would like to use. 3. I am going to send him over to [facility] to be evaluated with laboratory studies as well as EKG and chest x-ray. 4. I have reviewed paperwork from Dr. Elmore Guise who feels that from both a medical and cardiac standpoint the patient is a candidate for total knee replacement.  Therefore we will proceed if his laboratory studies and chest x-ray and EKG are normal.   Arlys John D. Santiago Bumpers, PA-C 08/28/2011 7:32 AM

## 2011-08-28 ENCOUNTER — Encounter (HOSPITAL_COMMUNITY): Payer: Self-pay | Admitting: *Deleted

## 2011-08-28 ENCOUNTER — Inpatient Hospital Stay (HOSPITAL_COMMUNITY)
Admission: RE | Admit: 2011-08-28 | Discharge: 2011-08-30 | DRG: 470 | Disposition: A | Discharge status: Home Health | Payer: Medicare Other | Source: Ambulatory Visit | Attending: Orthopaedic Surgery | Admitting: Orthopaedic Surgery

## 2011-08-28 ENCOUNTER — Encounter (HOSPITAL_COMMUNITY): Payer: Self-pay | Admitting: Vascular Surgery

## 2011-08-28 ENCOUNTER — Ambulatory Visit (HOSPITAL_COMMUNITY): Payer: Medicare Other | Admitting: Vascular Surgery

## 2011-08-28 ENCOUNTER — Encounter (HOSPITAL_COMMUNITY)
Admission: RE | Disposition: A | Discharge status: Home Health | Payer: Self-pay | Source: Ambulatory Visit | Attending: Orthopaedic Surgery

## 2011-08-28 DIAGNOSIS — D62 Acute posthemorrhagic anemia: Secondary | ICD-10-CM | POA: Diagnosis not present

## 2011-08-28 DIAGNOSIS — Z01812 Encounter for preprocedural laboratory examination: Secondary | ICD-10-CM | POA: Diagnosis not present

## 2011-08-28 DIAGNOSIS — G8918 Other acute postprocedural pain: Secondary | ICD-10-CM | POA: Diagnosis not present

## 2011-08-28 DIAGNOSIS — Z01810 Encounter for preprocedural cardiovascular examination: Secondary | ICD-10-CM

## 2011-08-28 DIAGNOSIS — Z6829 Body mass index (BMI) 29.0-29.9, adult: Secondary | ICD-10-CM | POA: Diagnosis not present

## 2011-08-28 DIAGNOSIS — M129 Arthropathy, unspecified: Secondary | ICD-10-CM | POA: Diagnosis present

## 2011-08-28 DIAGNOSIS — M1710 Unilateral primary osteoarthritis, unspecified knee: Secondary | ICD-10-CM | POA: Diagnosis present

## 2011-08-28 DIAGNOSIS — R9431 Abnormal electrocardiogram [ECG] [EKG]: Secondary | ICD-10-CM | POA: Diagnosis present

## 2011-08-28 DIAGNOSIS — F17200 Nicotine dependence, unspecified, uncomplicated: Secondary | ICD-10-CM | POA: Diagnosis present

## 2011-08-28 DIAGNOSIS — F4310 Post-traumatic stress disorder, unspecified: Secondary | ICD-10-CM | POA: Diagnosis not present

## 2011-08-28 DIAGNOSIS — I4949 Other premature depolarization: Secondary | ICD-10-CM | POA: Diagnosis present

## 2011-08-28 DIAGNOSIS — Z79899 Other long term (current) drug therapy: Secondary | ICD-10-CM

## 2011-08-28 DIAGNOSIS — IMO0002 Reserved for concepts with insufficient information to code with codable children: Secondary | ICD-10-CM | POA: Diagnosis not present

## 2011-08-28 DIAGNOSIS — Z87442 Personal history of urinary calculi: Secondary | ICD-10-CM | POA: Diagnosis not present

## 2011-08-28 DIAGNOSIS — M25569 Pain in unspecified knee: Secondary | ICD-10-CM | POA: Diagnosis not present

## 2011-08-28 HISTORY — DX: Post-traumatic stress disorder, unspecified: F43.10

## 2011-08-28 HISTORY — PX: TOTAL KNEE ARTHROPLASTY: SHX125

## 2011-08-28 SURGERY — ARTHROPLASTY, KNEE, TOTAL
Anesthesia: General | Site: Knee | Laterality: Right | Wound class: Clean

## 2011-08-28 MED ORDER — LACTATED RINGERS IV SOLN
INTRAVENOUS | Status: DC
  Administered 2011-08-28: 10:00:00 via INTRAVENOUS

## 2011-08-28 MED ORDER — MIDAZOLAM HCL 2 MG/2ML IJ SOLN
INTRAMUSCULAR | Status: AC
  Filled 2011-08-28: qty 2

## 2011-08-28 MED ORDER — CEFAZOLIN SODIUM-DEXTROSE 2-3 GM-% IV SOLR
2.0000 g | Freq: Four times a day (QID) | INTRAVENOUS | Status: AC
  Administered 2011-08-28 – 2011-08-29 (×3): 2 g via INTRAVENOUS
  Filled 2011-08-28 (×3): qty 50

## 2011-08-28 MED ORDER — RIVAROXABAN 10 MG PO TABS
10.0000 mg | ORAL_TABLET | ORAL | Status: DC
  Administered 2011-08-29 – 2011-08-30 (×2): 10 mg via ORAL
  Filled 2011-08-28 (×3): qty 1

## 2011-08-28 MED ORDER — PROPOFOL 10 MG/ML IV EMUL
INTRAVENOUS | Status: DC | PRN
  Administered 2011-08-28: 200 mg via INTRAVENOUS

## 2011-08-28 MED ORDER — ACETAMINOPHEN 10 MG/ML IV SOLN
1000.0000 mg | Freq: Four times a day (QID) | INTRAVENOUS | Status: AC
  Administered 2011-08-28 – 2011-08-29 (×3): 1000 mg via INTRAVENOUS
  Filled 2011-08-28 (×5): qty 100

## 2011-08-28 MED ORDER — HETASTARCH-ELECTROLYTES 6 % IV SOLN
INTRAVENOUS | Status: DC | PRN
  Administered 2011-08-28: 12:00:00 via INTRAVENOUS

## 2011-08-28 MED ORDER — OXYCODONE HCL 5 MG PO TABS
5.0000 mg | ORAL_TABLET | ORAL | Status: DC | PRN
  Administered 2011-08-28 – 2011-08-30 (×7): 10 mg via ORAL
  Filled 2011-08-28 (×7): qty 2

## 2011-08-28 MED ORDER — ACETAMINOPHEN 10 MG/ML IV SOLN
INTRAVENOUS | Status: AC
  Filled 2011-08-28: qty 100

## 2011-08-28 MED ORDER — METHOCARBAMOL 100 MG/ML IJ SOLN
500.0000 mg | Freq: Four times a day (QID) | INTRAVENOUS | Status: DC | PRN
  Filled 2011-08-28: qty 5

## 2011-08-28 MED ORDER — FENTANYL CITRATE 0.05 MG/ML IJ SOLN
50.0000 ug | INTRAMUSCULAR | Status: DC | PRN
  Administered 2011-08-28: 50 ug via INTRAVENOUS

## 2011-08-28 MED ORDER — FENTANYL CITRATE 0.05 MG/ML IJ SOLN
INTRAMUSCULAR | Status: AC
  Filled 2011-08-28: qty 2

## 2011-08-28 MED ORDER — METHOCARBAMOL 500 MG PO TABS
500.0000 mg | ORAL_TABLET | Freq: Four times a day (QID) | ORAL | Status: DC | PRN
  Administered 2011-08-29 – 2011-08-30 (×2): 500 mg via ORAL
  Filled 2011-08-28 (×2): qty 1

## 2011-08-28 MED ORDER — METOCLOPRAMIDE HCL 10 MG PO TABS: 5.0000 mg | ORAL_TABLET | Freq: Three times a day (TID) | ORAL | Status: DC | PRN

## 2011-08-28 MED ORDER — ONDANSETRON HCL 4 MG PO TABS: 4.0000 mg | ORAL_TABLET | Freq: Four times a day (QID) | ORAL | Status: DC | PRN

## 2011-08-28 MED ORDER — SODIUM CHLORIDE 0.9 % IR SOLN
Status: DC | PRN
  Administered 2011-08-28: 3000 mL
  Administered 2011-08-28: 1000 mL

## 2011-08-28 MED ORDER — ZOLPIDEM TARTRATE 5 MG PO TABS: 5.0000 mg | ORAL_TABLET | Freq: Every evening | ORAL | Status: DC | PRN

## 2011-08-28 MED ORDER — PHENOL 1.4 % MT LIQD: 1.0000 | OROMUCOSAL | Status: DC | PRN

## 2011-08-28 MED ORDER — BUPIVACAINE-EPINEPHRINE 0.25% -1:200000 IJ SOLN
INTRAMUSCULAR | Status: DC | PRN
  Administered 2011-08-28: 30 mL

## 2011-08-28 MED ORDER — FENTANYL CITRATE 0.05 MG/ML IJ SOLN
INTRAMUSCULAR | Status: DC | PRN
  Administered 2011-08-28 (×2): 50 ug via INTRAVENOUS

## 2011-08-28 MED ORDER — BUPIVACAINE-EPINEPHRINE PF 0.5-1:200000 % IJ SOLN
INTRAMUSCULAR | Status: DC | PRN
  Administered 2011-08-28: 25 mL

## 2011-08-28 MED ORDER — SODIUM CHLORIDE 0.9 % IV SOLN
75.0000 mL/h | INTRAVENOUS | Status: DC
  Administered 2011-08-29: 75 mL/h via INTRAVENOUS

## 2011-08-28 MED ORDER — CHLORHEXIDINE GLUCONATE 4 % EX LIQD: 60.0000 mL | Freq: Once | CUTANEOUS | Status: DC

## 2011-08-28 MED ORDER — HYDROMORPHONE HCL PF 1 MG/ML IJ SOLN: 0.5000 mg | INTRAMUSCULAR | Status: DC | PRN

## 2011-08-28 MED ORDER — KETOROLAC TROMETHAMINE 15 MG/ML IJ SOLN
15.0000 mg | Freq: Four times a day (QID) | INTRAMUSCULAR | Status: AC
  Administered 2011-08-28 (×2): 15 mg via INTRAVENOUS
  Filled 2011-08-28 (×2): qty 1

## 2011-08-28 MED ORDER — ONDANSETRON HCL 4 MG/2ML IJ SOLN
INTRAMUSCULAR | Status: DC | PRN
  Administered 2011-08-28: 4 mg via INTRAVENOUS

## 2011-08-28 MED ORDER — ACETAMINOPHEN 10 MG/ML IV SOLN
1000.0000 mg | Freq: Once | INTRAVENOUS | Status: AC
  Administered 2011-08-28: 1000 mg via INTRAVENOUS
  Filled 2011-08-28: qty 100

## 2011-08-28 MED ORDER — DEXAMETHASONE SODIUM PHOSPHATE 4 MG/ML IJ SOLN
INTRAMUSCULAR | Status: DC | PRN
  Administered 2011-08-28: 10 mg via INTRAVENOUS

## 2011-08-28 MED ORDER — ONDANSETRON HCL 4 MG/2ML IJ SOLN: 4.0000 mg | Freq: Four times a day (QID) | INTRAMUSCULAR | Status: DC | PRN

## 2011-08-28 MED ORDER — METOCLOPRAMIDE HCL 5 MG/ML IJ SOLN: 5.0000 mg | Freq: Three times a day (TID) | INTRAMUSCULAR | Status: DC | PRN

## 2011-08-28 MED ORDER — DOCUSATE SODIUM 100 MG PO CAPS
100.0000 mg | ORAL_CAPSULE | Freq: Two times a day (BID) | ORAL | Status: DC
  Administered 2011-08-28 – 2011-08-30 (×4): 100 mg via ORAL
  Filled 2011-08-28 (×5): qty 1

## 2011-08-28 MED ORDER — SODIUM CHLORIDE 0.9 % IV SOLN: INTRAVENOUS | Status: DC

## 2011-08-28 MED ORDER — LACTATED RINGERS IV SOLN
INTRAVENOUS | Status: DC | PRN
  Administered 2011-08-28: 10:00:00 via INTRAVENOUS

## 2011-08-28 MED ORDER — MIDAZOLAM HCL 2 MG/2ML IJ SOLN
1.0000 mg | INTRAMUSCULAR | Status: DC | PRN
  Administered 2011-08-28: 2 mg via INTRAVENOUS

## 2011-08-28 MED ORDER — MENTHOL 3 MG MT LOZG: 1.0000 | LOZENGE | OROMUCOSAL | Status: DC | PRN

## 2011-08-28 MED ORDER — DROPERIDOL 2.5 MG/ML IJ SOLN
INTRAMUSCULAR | Status: DC | PRN
  Administered 2011-08-28: 0.625 mg via INTRAVENOUS

## 2011-08-28 SURGICAL SUPPLY — 67 items
BANDAGE ESMARK 6X9 LF (GAUZE/BANDAGES/DRESSINGS) ×1 IMPLANT
BLADE SAGITTAL 25.0X1.19X90 (BLADE) ×2 IMPLANT
BNDG ESMARK 6X9 LF (GAUZE/BANDAGES/DRESSINGS) ×2
BOWL SMART MIX CTS (DISPOSABLE) ×2 IMPLANT
CEMENT HV SMART SET (Cement) ×4 IMPLANT
CLOTH BEACON ORANGE TIMEOUT ST (SAFETY) ×2 IMPLANT
COVER BACK TABLE 24X17X13 BIG (DRAPES) ×2 IMPLANT
COVER SURGICAL LIGHT HANDLE (MISCELLANEOUS) ×4 IMPLANT
CUFF TOURNIQUET SINGLE 34IN LL (TOURNIQUET CUFF) ×2 IMPLANT
CUFF TOURNIQUET SINGLE 44IN (TOURNIQUET CUFF) IMPLANT
DRAPE EXTREMITY T 121X128X90 (DRAPE) ×2 IMPLANT
DRAPE PROXIMA HALF (DRAPES) ×2 IMPLANT
DRSG ADAPTIC 3X8 NADH LF (GAUZE/BANDAGES/DRESSINGS) ×2 IMPLANT
DRSG PAD ABDOMINAL 8X10 ST (GAUZE/BANDAGES/DRESSINGS) ×4 IMPLANT
DURAPREP 26ML APPLICATOR (WOUND CARE) ×2 IMPLANT
ELECT CAUTERY BLADE 6.4 (BLADE) ×2 IMPLANT
ELECT REM PT RETURN 9FT ADLT (ELECTROSURGICAL) ×2
ELECTRODE REM PT RTRN 9FT ADLT (ELECTROSURGICAL) ×1 IMPLANT
EVACUATOR 1/8 PVC DRAIN (DRAIN) ×2 IMPLANT
FACESHIELD LNG OPTICON STERILE (SAFETY) ×4 IMPLANT
FLOSEAL 10ML (HEMOSTASIS) IMPLANT
GLOVE BIO SURGEON STRL SZ8.5 (GLOVE) ×2 IMPLANT
GLOVE BIOGEL PI IND STRL 7.0 (GLOVE) ×1 IMPLANT
GLOVE BIOGEL PI IND STRL 8 (GLOVE) ×1 IMPLANT
GLOVE BIOGEL PI IND STRL 8.5 (GLOVE) ×1 IMPLANT
GLOVE BIOGEL PI INDICATOR 7.0 (GLOVE) ×1
GLOVE BIOGEL PI INDICATOR 8 (GLOVE) ×1
GLOVE BIOGEL PI INDICATOR 8.5 (GLOVE) ×1
GLOVE ECLIPSE 8.0 STRL XLNG CF (GLOVE) ×4 IMPLANT
GLOVE SURG ORTHO 8.5 STRL (GLOVE) ×2 IMPLANT
GLOVE SURG SS PI 6.5 STRL IVOR (GLOVE) ×2 IMPLANT
GLOVE SURG SS PI 8.5 STRL IVOR (GLOVE) ×1
GLOVE SURG SS PI 8.5 STRL STRW (GLOVE) ×1 IMPLANT
GOWN PREVENTION PLUS XLARGE (GOWN DISPOSABLE) ×2 IMPLANT
GOWN SRG XL XLNG 56XLVL 4 (GOWN DISPOSABLE) ×2 IMPLANT
GOWN STRL NON-REIN LRG LVL3 (GOWN DISPOSABLE) IMPLANT
GOWN STRL NON-REIN XL XLG LVL4 (GOWN DISPOSABLE) ×2
GOWN STRL REIN 3XL LVL4 (GOWN DISPOSABLE) ×2 IMPLANT
HANDPIECE INTERPULSE COAX TIP (DISPOSABLE) ×1
KIT BASIN OR (CUSTOM PROCEDURE TRAY) ×2 IMPLANT
KIT ROOM TURNOVER OR (KITS) ×2 IMPLANT
MANIFOLD NEPTUNE II (INSTRUMENTS) ×2 IMPLANT
MARKER SPHERE PSV REFLC THRD 5 (MARKER) IMPLANT
NEEDLE 22X1 1/2 (OR ONLY) (NEEDLE) ×2 IMPLANT
NS IRRIG 1000ML POUR BTL (IV SOLUTION) ×2 IMPLANT
PACK TOTAL JOINT (CUSTOM PROCEDURE TRAY) ×2 IMPLANT
PAD ARMBOARD 7.5X6 YLW CONV (MISCELLANEOUS) ×4 IMPLANT
PAD CAST 4YDX4 CTTN HI CHSV (CAST SUPPLIES) ×1 IMPLANT
PADDING CAST COTTON 4X4 STRL (CAST SUPPLIES) ×1
PADDING CAST COTTON 6X4 STRL (CAST SUPPLIES) ×2 IMPLANT
PIN SCHANZ 4MM 130MM (PIN) IMPLANT
SET HNDPC FAN SPRY TIP SCT (DISPOSABLE) ×1 IMPLANT
SPONGE GAUZE 4X4 12PLY (GAUZE/BANDAGES/DRESSINGS) ×2 IMPLANT
STAPLER VISISTAT 35W (STAPLE) ×2 IMPLANT
SUCTION FRAZIER TIP 10 FR DISP (SUCTIONS) IMPLANT
SUT BONE WAX W31G (SUTURE) ×2 IMPLANT
SUT ETHIBOND NAB CT1 #1 30IN (SUTURE) ×6 IMPLANT
SUT MNCRL AB 3-0 PS2 18 (SUTURE) ×2 IMPLANT
SUT VIC AB 0 CT1 27 (SUTURE) ×1
SUT VIC AB 0 CT1 27XBRD ANBCTR (SUTURE) ×1 IMPLANT
SUT VIC AB 1 CT1 27 (SUTURE) ×2
SUT VIC AB 1 CT1 27XBRD ANBCTR (SUTURE) ×2 IMPLANT
SYR CONTROL 10ML LL (SYRINGE) ×2 IMPLANT
TOWEL OR 17X24 6PK STRL BLUE (TOWEL DISPOSABLE) ×2 IMPLANT
TOWEL OR 17X26 10 PK STRL BLUE (TOWEL DISPOSABLE) ×2 IMPLANT
TRAY FOLEY CATH 14FR (SET/KITS/TRAYS/PACK) ×2 IMPLANT
WATER STERILE IRR 1000ML POUR (IV SOLUTION) ×2 IMPLANT

## 2011-08-28 NOTE — Op Note (Signed)
[name], [name] NO.:  [id number]  MEDICAL RECORD NO.:  [id number]  LOCATION:  5019                         FACILITY:  [facility]  PHYSICIAN:  Claude Manges. Tarquin Welcher, M.D.DATE OF BIRTH:  1945-05-24  DATE OF PROCEDURE:  08/28/2011 DATE OF DISCHARGE:                              OPERATIVE REPORT   PREOPERATIVE DIAGNOSIS:  End-stage osteoarthritis, right knee.  POSTOPERATIVE DIAGNOSIS:  End-stage osteoarthritis, right knee.  PROCEDURE:  Right total knee replacement.  SURGEON:  Claude Manges. Cleophas Dunker, M.D.  ASSISTANT:  Arlys John D. Petrarca, PA-C.  ANESTHESIA:  General with supplemental femoral nerve block.  COMPLICATIONS:  None.  COMPONENTS:  DePuy LCS large femoral component, a #5 rotating keeled tibial tray, a 10-mm bridging bearing, and a metal-backed 3-peg rotating patella.  All was secured with polymethylmethacrylate.  PROCEDURE:  [name] was met in the holding area, identified his right knee as an appropriate operative site.  He did receive a femoral nerve block.  [name] was then transported to room #10 and placed under general orotracheal anesthesia without difficulty.  Nursing staff inserted a Foley catheter, urine was clear.  Tourniquet was then applied to the right thigh.  The leg was prepped with Betadine scrub and DuraPrep from the tourniquet to the midfoot. Sterile draping was performed.  With the extremity still elevated, was Esmarch exsanguinated with a proximal tourniquet at 350 mmHg.  We changed gloves and called a time out.  A midline longitudinal incision was then made, centered about the patella, extending from the superior pouch to tibial tubercle via sharp dissection.  Incision carried down the subcutaneous tissue.  Small bleeders were Bovie coagulated.  The first layer of capsule was incised in the midline and a medial parapatellar incision was made with the Bovie.  The joint was entered.  There was abundant amount of  beefy-red synovitis and about a 20-mL effusion.  Patella was everted to 180 degrees and the knee flexed to 90 degrees.  Synovectomy was performed. There were large osteophytes along the medial and lateral femoral condyles.  Near-complete loss of articular cartilage medially and over 80% on the lateral condyle, lateral compartments as well as the patellofemoral joint.  There was a fixed-varus position and a medial release was performed subperiosteally with a Cobb elevator.  I sized a large femoral component, which is the same size that was applied in the left total knee replacement.  First bony cut was made transversely on the tibia at 7-degree angle of declination.  The external guide was then applied.  The transverse cut was made.  We checked the alignment with every bony cut and we appeared to have anatomic position.  Subsequent cuts were then made on the femur where this 4-degree distal femoral valgus cut.  MCL and LCL remained intact throughout the procedure.  Lamina spreader was inserted in the medial and lateral compartments to visualize the structures.  Medial and lateral menisci were removed as well as ACL and PCL.  There was a large Baker cyst posteriorly, which was debrided.  There were large osteophytes in the medial and lateral femoral condyles, which removed with a curved 3/4 inch osteotome.  We then checked our flexion and extension  gaps and were symmetrical at 10 mm.  Again checked alignment, I thought it was perfect.  A final cut was made on the femur for tapering and for the central holes.  Retractors were then placed around the tibia, measured at #5 tibial tray.  The center hole was made, followed by the keeled cut.  With the tibial tray still in place, we applied a 10-mm bridging bearing, followed by the trial femoral component.  We had excellent extension. The patient did have about a 10-degree fixed flexion contracture preoperatively, which was now corrected.   There was no opening with varus or valgus stress.  We had excellent tracking of the polyethylene bearing.  The patella was then prepared by removing 12 mm of bone leaving 15 mm of patellar thickness.  The jig was applied and 3 holes made and the trial patella inserted, through a full range of motion.  It appeared to track in the midline.  The trial components were removed.  The joint was copiously irrigated with saline solution.  Retractors were then placed around the tibia. Final components were applied with polymethylmethacrylate.  We initially applied the tibial tray, followed by the polyethylene bearing and the large femoral component.  We impacted each of the components and removed extraneous methacrylate from the periphery of the components.  With the knee in extension, we thought we had excellent alignment.  Patella was applied with the same methacrylate and a patellar clamp.  At approximately 15 minutes, the methacrylate had hardened.  We checked to be sure there was no further extraneous methacrylate and was none. While waiting for the methacrylate to mature, we infiltrated the deep capsule with 0.25% Marcaine with epinephrine.  We inserted a Hemovac.  At that point, the tourniquet was deflated at 93 minutes.  There were any bleeders that were Bovie coagulated.  The field was nice and dry, but nice carefully refill to the joint surface.  Wound was again irrigated with saline solution.  The deep capsule was closed with interrupted #1 Ethibond, superficial capsule closed with running 0 Vicryl, subcu with 2-0 Vicryl and 3-0 Monocryl.  Skin was closed with skin clips.  A sterile bulky dressing was applied, followed by the patient's support stocking.  Patient tolerated the procedure well without complications.     Claude Manges. Cleophas Dunker, M.D.     PWW/MEDQ  D:  08/28/2011  T:  08/28/2011  Job:  [id number]

## 2011-08-28 NOTE — Progress Notes (Signed)
Patient ID: [name], male   DOB: 1946-04-05, 66 y.o.   MRN: [id number] There has been no change in health status since  the current H&P.I have examined the patient and discussed the surgery. No contraindications to the planned procedure exist.

## 2011-08-28 NOTE — Progress Notes (Signed)
Orthopedic Tech Progress Note Patient Details:  [name] [birth year] [id number]  Patient ID: [name], male   DOB: 18-Mar-1946, 66 y.o.   MRN: [id number] Viewed order from rn order list  Nikki Dom 08/28/2011, 1:53 PM

## 2011-08-28 NOTE — Brief Op Note (Signed)
08/28/2011  1:02 PM  PATIENT:  Minna Antis  66 y.o. male  PRE-OPERATIVE DIAGNOSIS:  osteoarthritis right knee-end stage  POST-OPERATIVE DIAGNOSIS:  osteoarthritis right knee-end stage  PROCEDURE:  Procedure(s) (LRB): TOTAL KNEE ARTHROPLASTY (Right)  SURGEON:  Surgeon(s) and Role:    * Valeria Batman, MD - Primary  PHYSICIAN ASSISTANT: [name], PA-C  ANESTHESIA:   regional and general  EBL:  Total I/O In: 2500 [I.V.:2000; IV Piggyback:500] Out: 300 [Urine:300]  BLOOD ADMINISTERED:none  DRAINS: Penrose drain in the right knee   LOCAL MEDICATIONS USED:  MARCAINE     SPECIMEN:  No Specimen  DISPOSITION OF SPECIMEN:  N/A  COUNTS:  YES  TOURNIQUET:   Total Tourniquet Time Documented: Thigh (Right) - 93 minutes  DICTATION: .Other Dictation: Dictation Number [id number]  PLAN OF CARE: Admit to inpatient   PATIENT DISPOSITION:  PACU - hemodynamically stable.   Delay start of Pharmacological VTE agent (>24hrs) due to surgical blood loss or risk of bleeding: not applicable

## 2011-08-28 NOTE — Anesthesia Procedure Notes (Signed)
Anesthesia Regional Block:  Femoral nerve block  Pre-Anesthetic Checklist: ,, timeout performed, Correct Patient, Correct Site, Correct Laterality, Correct Procedure, Correct Position, site marked, Risks and benefits discussed,  Surgical consent,  Pre-op evaluation,  At surgeon's request and post-op pain management  Laterality: Right  Prep: chloraprep       Needles:   Needle Type: Other   (Arrow Echogenic)   Needle Length: 9cm  Needle Gauge: 21    Additional Needles:  Procedures: ultrasound guided Femoral nerve block Narrative:  Start time: 08/28/2011 10:12 AM End time: 08/28/2011 10:20 AM Injection made incrementally with aspirations every 5 mL.  Performed by: Personally  Anesthesiologist: C. Lysette Lindenbaum MD  Additional Notes: Ultrasound guidance used to: id relevant anatomy, confirm needle position, local anesthetic spread, avoidance of vascular puncture. Picture saved. No complications. Block performed personally by Janetta Hora. Gelene Mink, MD    Femoral nerve block

## 2011-08-28 NOTE — Progress Notes (Signed)
Orthopedic Tech Progress Note Patient Details:  [name] 1945-09-17 [id number]  CPM Right Knee CPM Right Knee: On Right Knee Flexion (Degrees): 50  Right Knee Extension (Degrees): 0  Additional Comments: trapeze bar patient helper   Nikki Dom 08/28/2011, 1:53 PM

## 2011-08-28 NOTE — Anesthesia Preprocedure Evaluation (Addendum)
Anesthesia Evaluation  Patient identified by MRN, date of birth, ID band Patient awake    Reviewed: Allergy & Precautions, H&P , NPO status , Patient's Chart, lab work & pertinent test results  History of Anesthesia Complications Negative for: history of anesthetic complications  Airway Mallampati: II      Dental  (+) Caps, Dental Advisory Given and Teeth Intact   Pulmonary neg pulmonary ROS, Current Smoker,  breath sounds clear to auscultation  Pulmonary exam normal       Cardiovascular negative cardio ROS  Rhythm:Regular Rate:Normal     Neuro/Psych negative neurological ROS     GI/Hepatic negative GI ROS, Neg liver ROS,   Endo/Other  negative endocrine ROS  Renal/GU negative Renal ROS     Musculoskeletal   Abdominal   Peds  Hematology   Anesthesia Other Findings   Reproductive/Obstetrics                          Anesthesia Physical Anesthesia Plan  ASA: I  Anesthesia Plan: General   Post-op Pain Management:    Induction: Intravenous  Airway Management Planned: LMA  Additional Equipment:   Intra-op Plan:   Post-operative Plan: Extubation in OR  Informed Consent: I have reviewed the patients History and Physical, chart, labs and discussed the procedure including the risks, benefits and alternatives for the proposed anesthesia with the patient or authorized representative who has indicated his/her understanding and acceptance.   Dental advisory given  Plan Discussed with: CRNA, Anesthesiologist and Surgeon  Anesthesia Plan Comments:         Anesthesia Quick Evaluation

## 2011-08-28 NOTE — Plan of Care (Signed)
Problem: Consults Goal: Diagnosis- Total Joint Replacement Outcome: Completed/Met Date Met:  08/28/11 Primary Total Knee

## 2011-08-28 NOTE — Transfer of Care (Signed)
Immediate Anesthesia Transfer of Care Note  Patient: [name]  Procedure(s) Performed: Procedure(s) (LRB): TOTAL KNEE ARTHROPLASTY (Right)  Patient Location: PACU  Anesthesia Type: General and Regional  Level of Consciousness: sedated and patient cooperative  Airway & Oxygen Therapy: Patient Spontanous Breathing and Patient connected to nasal cannula oxygen  Post-op Assessment: Report given to PACU RN and Post -op Vital signs reviewed and stable  Post vital signs: Reviewed and stable  Complications: No apparent anesthesia complications

## 2011-08-28 NOTE — Anesthesia Postprocedure Evaluation (Signed)
Anesthesia Post Note  Patient: [name]  Procedure(s) Performed: Procedure(s) (LRB): TOTAL KNEE ARTHROPLASTY (Right)  Anesthesia type: General  Patient location: PACU  Post pain: Pain level controlled and Adequate analgesia  Post assessment: Post-op Vital signs reviewed, Patient's Cardiovascular Status Stable, Respiratory Function Stable, Patent Airway and Pain level controlled  Last Vitals:  Filed Vitals:   08/28/11 1400  BP:   Pulse: 69  Temp:   Resp: 13    Post vital signs: Reviewed and stable  Level of consciousness: awake, alert  and oriented  Complications: No apparent anesthesia complications

## 2011-08-29 ENCOUNTER — Encounter (HOSPITAL_COMMUNITY): Payer: Self-pay | Admitting: Orthopaedic Surgery

## 2011-08-29 LAB — BASIC METABOLIC PANEL
BUN: 10 mg/dL (ref 6–23)
Creatinine, Ser: 0.83 mg/dL (ref 0.50–1.35)
GFR calc Af Amer: >90 mL/min (ref >90)
GFR calc non Af Amer: >90 mL/min (ref >90)
Potassium: 3.7 mEq/L (ref 3.5–5.1)

## 2011-08-29 LAB — CBC
HCT: 27.1 % — ABNORMAL LOW (ref 39.0–52.0)
MCHC: 32.1 g/dL (ref 30.0–36.0)
MCV: 79.5 fL (ref 78.0–100.0)
RDW: 15.4 % (ref 11.5–15.5)

## 2011-08-29 MED ORDER — ACETAMINOPHEN 10 MG/ML IV SOLN: 1000.0000 mg | Freq: Four times a day (QID) | INTRAVENOUS | Status: DC

## 2011-08-29 NOTE — Progress Notes (Signed)
Physical Therapy Treatment Note   08/29/11 1625  PT Visit Information  Last PT Received On 08/29/11  Assistance Needed +1  PT Time Calculation  PT Start Time 1625  Subjective Data  Subjective Pt received sitting up in chair. RN reports amb with patient in hallway earlier today and observing him standing at sink shaving earlier without assist.  Precautions  Precautions Knee  Restrictions  RLE Weight Bearing PWB  RLE Partial Weight Bearing Percentage or Pounds 50  Cognition  Overall Cognitive Status Appears within functional limits for tasks assessed/performed  Arousal/Alertness Awake/alert  Orientation Level Oriented X4 / Intact  Behavior During Session [facility] for tasks performed  Bed Mobility  Bed Mobility Not assessed (pt up in chair upon arrival)  Transfers  Sit to Stand 5: Supervision;From chair/3-in-1  Stand to Sit 5: Supervision;To chair/3-in-1  Details for Transfer Assistance v/c's for safety due to impulsivity  Ambulation/Gait  Ambulation/Gait Assistance 5: Supervision  Ambulation Distance (Feet) 150 Feet  Assistive device Rolling walker  Ambulation/Gait Assistance Details v/c's to maintain R LE PWB  Gait Pattern Step-through pattern;Decreased stance time - right  Stairs No (no steps at home)  Exercises  Exercises Total Joint  Total Joint Exercises  Heel Slides AROM;Right;10 reps;Seated  Hip ABduction/ADduction AROM;Right;10 reps;Seated (with LEs elevated)  Straight Leg Raises AROM;Right;10 reps;Seated (with LEs elevated)  Long Arc Quad AROM;Right;10 reps;Seated  Knee Flexion AAROM;Right;10 reps;Seated (with LEs elevated)  PT - End of Session  Activity Tolerance Patient tolerated treatment well  Patient left in chair;with call bell/phone within reach;with family/visitor present (reitterated importance of calling for assist for mobility)  PT - Assessment/Plan  Comments on Treatment Session Patient con't to demonstrate great progress towards all goals. Patient  con't to be impulsive and non-compliant with R LE PWB.  PT Plan Discharge plan remains appropriate;Frequency remains appropriate  PT Frequency 7X/week  Follow Up Recommendations Home health PT;Supervision/Assistance - 24 hour  Equipment Recommended Rolling walker with 5" wheels (already delivered to room)  Acute Rehab PT Goals  Time For Goal Achievement 09/05/11  Potential to Achieve Goals Good  PT Goal: Supine/Side to Sit - Progress Progressing toward goal  PT Goal: Sit to Supine/Side - Progress Progressing toward goal  PT Goal: Sit to Stand - Progress Progressing toward goal  PT Goal: Ambulate - Progress Progressing toward goal  PT Goal: Perform Home Exercise Program - Progress Progressing toward goal  Additional Goals  PT Goal: Additional Goal #1 - Progress Progressing toward goal    Pain: 6/10 R knee pain, RN notified  Lewis Shock, PT, DPT Pager #: [phone] Office #: [phone]

## 2011-08-29 NOTE — Progress Notes (Signed)
CARE MANAGEMENT NOTE 08/29/2011  Patient:  [name], [name]   Account Number:  [id number]  Date Initiated:  08/29/2011  Documentation initiated by:  Vance Peper  Subjective/Objective Assessment:   66 yr old male s/p right total knee arthroplasty     Action/Plan:   Spoke with patient regarding Home Health needs. Preoperatively setup with [facility]- no changes. CPM has been delivered to patients home, he needs a rolling walker. CM will order one for him.   Anticipated DC Date:  08/30/2011   Anticipated DC Plan:  HOME W HOME HEALTH SERVICES      DC Planning Services  CM consult      PAC Choice  DURABLE MEDICAL EQUIPMENT  HOME HEALTH   Choice offered to / List presented to:  C-1 Patient   DME arranged  Levan Hurst      DME agency  [facility]     HH arranged  HH-2 PT      [facility] agency  [facility]   Status of service:  Completed, signed off  Discharge Disposition:  HOME W HOME HEALTH SERVICES

## 2011-08-29 NOTE — Progress Notes (Signed)
Patient ID: [name], male   DOB: [birth year], 66 y.o.   MRN: [id number] PATIENT ID: [name]        MRN:  [id number]          DOB/AGE: Mar 19, 1946 / 66 y.o.  [name], MD   [name], PA-C [address] [place], [place], [place]  [address]                             [phone]   PROGRESS NOTE  Subjective:  negative for Chest Pain  negative for Shortness of Breath  negative for Nausea/Vomiting   negative for Calf Pain  negative for Bowel Movement   Tolerating Diet: yes         Patient reports pain as mild.    Objective: Vital signs in last 24 hours:   Patient Vitals for the past 24 hrs:  BP Temp Temp src Pulse Resp SpO2 Height Weight  08/29/11 0451 93/57 mmHg 97 F (36.1 C) - 65  18  97 % - -  08/29/11 0219 106/55 mmHg 97 F (36.1 C) - 65  18  98 % - -  08/28/11 2212 102/58 mmHg 98.4 F (36.9 C) - 71  18  96 % - -  08/28/11 1430 132/88 mmHg 97.6 F (36.4 C) Oral 68  20  98 % - -  08/28/11 1416 - 97.6 F (36.4 C) - 68  18  95 % - -  08/28/11 1415 - - - 67  15  97 % - -  08/28/11 1400 119/78 mmHg - - 69  13  94 % - -  08/28/11 1345 120/80 mmHg - - 70  18  98 % - -  08/28/11 1333 130/72 mmHg - - 79  15  99 % - -  08/28/11 1330 - 97.3 F (36.3 C) - - - - - -  08/28/11 1230 - - - - - - 6\' 2"  (1.88 m) 102.785 kg (226 lb 9.6 oz)  08/28/11 0846 113/71 mmHg 98.2 F (36.8 C) Oral 71  18  99 % - -      Intake/Output from previous day:   04/23 0701 - 04/24 0700 In: 2900 [I.V.:2100] Out: 2625 [Urine:2150; Drains:475]   Intake/Output this shift:       Intake/Output      04/23 0701 - 04/24 0700 04/24 0701 - 04/25 0700   I.V. (mL/kg) 2100 (20.4)    IV Piggyback 800    Total Intake(mL/kg) 2900 (28.2)    Urine (mL/kg/hr) 2150 (0.9)    Drains 475    Total Output 2625    Net +275            LABORATORY DATA:  Basename 08/29/11 0535  WBC 12.6*  HGB 8.7*  HCT 27.1*  PLT 165    Basename 08/29/11 0535  NA 136  K 3.7  CL 103  CO2 26  BUN 10    CREATININE 0.83  GLUCOSE 133*  CALCIUM 9.3   Lab Results  Component Value Date   INR 1.11 08/20/2011    Examination:  General appearance: alert, cooperative and no distress  Wound Exam: clean, dry, intact   Drainage:  None: wound tissue dry  Motor Exam: EHL and FHL Intact  Sensory Exam: Superficial Peroneal, Deep Peroneal and Tibial normal  Vascular Exam: Normal  Assessment:    1 Day Post-Op  Procedure(s) (LRB): TOTAL KNEE ARTHROPLASTY (Right)  ADDITIONAL  DIAGNOSIS:  Principal Problem:  *Osteoarthritis of knee  Acute Blood Loss Anemia   Plan: Physical Therapy as ordered Partial Weight Bearing @ 50%% (PWB)  DVT Prophylaxis:  Xarelto  DISCHARGE PLAN: Home  DISCHARGE NEEDS: has equipment at home from prior TKR         [facility], Mahi Zabriskie W 08/29/2011, 7:52 AM

## 2011-08-29 NOTE — Progress Notes (Signed)
Physical Therapy Evaluation Note  Past Medical History  Diagnosis Date  . Arthritis   . PTSD (post-traumatic stress disorder)     controlled    Past Surgical History  Procedure Date  . Back surgery     2000  . Shoulder arthroscopy w/ rotator cuff repair 2012    right  . Joint replacement      left 2005  . Total knee arthroplasty 08/28/2011    Procedure: TOTAL KNEE ARTHROPLASTY;  Surgeon: Valeria Batman, MD;  Location: [facility] OR;  Service: Orthopedics;  Laterality: Right;     08/29/11 0753  PT Visit Information  Last PT Received On 08/29/11  PT Time Calculation  PT Start Time 0753  PT Stop Time 0820  PT Time Calculation (min) 27 min  Subjective Data  Subjective Pt received supine in bed with c/o 3/10 R knee pain.  Precautions  Precautions Knee  Restrictions  Weight Bearing Restrictions Yes  RLE Weight Bearing PWB  RLE Partial Weight Bearing Percentage or Pounds 50  Home Living  Lives With Significant other  Available Help at Discharge Family;Available 24 hours/day  Type of Home House  Home Access Level entry  Home Layout One level  Bathroom Shower/Tub Walk-in shower  Bathroom Toilet Handicapped height  Bathroom Accessibility Yes  How Accessible Accessible via walker  Home Adaptive Equipment Bedside commode/3-in-1;Walker - rolling;Built-in shower seat  Prior Function  Level of Independence Independent  Able to Take Stairs? 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  Communication  Communication No difficulties  Cognition  Overall Cognitive Status Appears within functional limits for tasks assessed/performed  Arousal/Alertness Awake/alert  Orientation Level Oriented X4 / Intact  Behavior During Session [facility] for tasks performed  Right Upper Extremity Assessment  RUE ROM/Strength/Tone WFL  Left Upper Extremity Assessment  LUE ROM/Strength/Tone WFL  Left Lower Extremity Assessment    LLE ROM/Strength/Tone WFL  Trunk Assessment  Trunk Assessment Normal  Bed Mobility  Bed Mobility Supine to Sit  Supine to Sit 5: Supervision;HOB flat  Details for Bed Mobility Assistance v/c's to decreased speed/impulsivity  Transfers  Transfers Sit to Stand;Stand to Sit  Sit to Stand 5: Supervision;From bed  Stand to Sit 5: Supervision;To chair/3-in-1  Details for Transfer Assistance v/c's for sequencing/technique for safety due to pt impulsivity  Ambulation/Gait  Ambulation/Gait Assistance 4: Min guard  Ambulation Distance (Feet) 150 Feet  Assistive device Rolling walker  Ambulation/Gait Assistance Details v/c's to maintain R LE PWB  Gait Pattern Step-to pattern;Decreased step length - right;Decreased stance time - right  Gait velocity v/c's to decrease pace, pt impulsive  Stairs No  Wheelchair Mobility  Wheelchair Mobility No  Exercises  Exercises Total Joint (pt provided HEP handout)  Total Joint Exercises  Ankle Circles/Pumps AROM;Both;10 reps  Quad Sets AROM;Right;10 reps  Heel Slides AROM;Right;10 reps (pt achieved 75 degrees active R knee flexion in sitting)  PT - End of Session  Equipment Utilized During Treatment Gait belt  Activity Tolerance Patient tolerated treatment well  Patient left in chair;with call bell/phone within reach (pt instructed not to get up on own)  Nurse Communication Mobility status  CPM Right Knee  CPM Right Knee Off  PT Assessment  Clinical Impression Statement Pt s/p R TKA presenting with decreased R LE strength and R knee ROM. Pt tolerating mobility well however is impulsive and has difficulty with maintaining R LE PWB. Patient safe to return home with significant  other. Pt with good home set up and support.  PT Recommendation/Assessment Patient needs continued PT services  PT Problem List Decreased strength;Decreased range of motion;Decreased safety awareness  Barriers to Discharge None  PT Therapy Diagnosis  Difficulty  walking;Abnormality of gait;Generalized weakness;Acute pain  PT Plan  PT Frequency 7X/week  PT Treatment/Interventions DME instruction;Gait training;Functional mobility training;Therapeutic activities;Therapeutic exercise  PT Recommendation  Follow Up Recommendations Home health PT;Supervision - Intermittent  Equipment Recommended Rolling walker with 5" wheels  Individuals Consulted  Consulted and Agree with Results and Recommendations Patient  Acute Rehab PT Goals  PT Goal Formulation With patient  Time For Goal Achievement 09/05/11  Potential to Achieve Goals Good  Pt will go Supine/Side to Sit Independently;with HOB 0 degrees  PT Goal: Supine/Side to Sit - Progress Goal set today  Pt will go Sit to Supine/Side with modified independence;with HOB 0 degrees  PT Goal: Sit to Supine/Side - Progress Goal set today  Pt will go Sit to Stand with modified independence (up to RW.)  PT Goal: Sit to Stand - Progress Goal set today  Pt will Ambulate >150 feet;with modified independence;with rolling walker  PT Goal: Ambulate - Progress Goal set today  Pt will Perform Home Exercise Program Independently  PT Goal: Perform Home Exercise Program - Progress Goal set today  Additional Goals  Additional Goal #1 Pt 100% compliant with R LE PWB at 50%.  PT Goal: Additional Goal #1 - Progress Goal set today  Written Expression  Dominant Hand Right    Pain: 3/10 R knee pain at rest  Lewis Shock, PT, DPT Pager #: [phone] Office #: [phone]

## 2011-08-29 NOTE — Discharge Instructions (Signed)
[facility] to be provided by [facility] [phone]

## 2011-08-29 NOTE — Progress Notes (Signed)
Occupational Therapy Evaluation Patient Details Name: [name] MRN: [id number] DOB: Jun 12, 1945 Today's Date: 08/29/2011 Time: 2536-6440 OT Time Calculation (min): 29 min  OT Assessment / Plan / Recommendation Clinical Impression  66 yo s/p R TKA. PT S to MOD I with all ADL. Pt will have significant other to A as needed. Educated pt on walk in shower transfer - backing in and using 3 in 1 in shower. Pt demonstrated or verbalized understanding with all learned tasks.    OT Assessment  Patient does not need any further OT services    Follow Up Recommendations  No OT follow up    Equipment Recommendations  None recommended by OT    Frequency      Precautions / Restrictions Precautions Precautions: Knee Restrictions Weight Bearing Restrictions: Yes RLE Weight Bearing: Partial weight bearing RLE Partial Weight Bearing Percentage or Pounds: 50   Pertinent Vitals/Pain     ADL  Eating/Feeding: Simulated;Independent Where Assessed - Eating/Feeding: Chair Grooming: Simulated;Modified independent Where Assessed - Grooming: Unsupported sitting Upper Body Bathing: Simulated;Set up Where Assessed - Upper Body Bathing: Sitting, chair;Unsupported Lower Body Bathing: Simulated;Modified independent Where Assessed - Lower Body Bathing: Sit to stand in shower;Sit to stand from chair Upper Body Dressing: Simulated;Set up Where Assessed - Upper Body Dressing: Sitting, chair Lower Body Dressing: Simulated;Set up Where Assessed - Lower Body Dressing: Sit to stand from chair Toilet Transfer: Performed;Modified independent Toilet Transfer Method: Proofreader: Bedside commode Toileting - Clothing Manipulation: Performed;Independent Where Assessed - Toileting Clothing Manipulation: Standing Toileting - Hygiene: Simulated;Independent Where Assessed - Toileting Hygiene: Standing Tub/Shower Transfer: Performed;Supervision/safety Tub/Shower Transfer Method:  Science writer: Walk in shower;Other (comment) (3 in 1) Equipment Used: Rolling walker Ambulation Related to ADLs: supervision. RW level. ADL Comments: S to Mod I    OT Goals Acute Rehab OT Goals OT Goal Formulation:  (eval only)  Visit Information  Last OT Received On: 08/29/11    Subjective Data      Prior Functioning  Home Living Lives With: Significant other Available Help at Discharge: Family;Available 24 hours/day Type of Home: House Home Access: Level entry Home Layout: One level Bathroom Shower/Tub: Health visitor: Handicapped height Bathroom Accessibility: Yes How Accessible: Accessible via walker Home Adaptive Equipment: Bedside commode/3-in-1;Walker - rolling;Built-in shower seat Prior Function Level of Independence: Independent Able to Take Stairs?: Yes Driving: Yes Vocation: Full time employment Communication Communication: No difficulties Dominant Hand: Right    Cognition  Overall Cognitive Status: Appears within functional limits for tasks assessed/performed Arousal/Alertness: Awake/alert Orientation Level: Oriented X4 / Intact Behavior During Session: [name] for tasks performed    Extremity/Trunk Assessment Right Upper Extremity Assessment RUE ROM/Strength/Tone: Within functional levels Left Upper Extremity Assessment LUE ROM/Strength/Tone: Within functional levels   Mobility     Exercise    Balance Balance Balance Assessed:  (WFL)  End of Session OT - End of Session Activity Tolerance: Patient tolerated treatment well Patient left: in chair;with call bell/phone within reach;with family/visitor present     Neely Kammerer,HILLARY 08/29/2011, 1:47 PM [facility], OTR/L  [phone] 08/29/2011

## 2011-08-30 DIAGNOSIS — D62 Acute posthemorrhagic anemia: Secondary | ICD-10-CM | POA: Diagnosis not present

## 2011-08-30 LAB — BASIC METABOLIC PANEL
GFR calc Af Amer: >90 mL/min (ref >90)
GFR calc non Af Amer: >90 mL/min (ref >90)
Potassium: 3.9 mEq/L (ref 3.5–5.1)
Sodium: 138 mEq/L (ref 135–145)

## 2011-08-30 LAB — CBC
MCHC: 32.2 g/dL (ref 30.0–36.0)
Platelets: 139 10*3/uL — ABNORMAL LOW (ref 150–400)
RDW: 15.5 % (ref 11.5–15.5)

## 2011-08-30 MED ORDER — FERROUS SULFATE 325 (65 FE) MG PO TABS
325.0000 mg | ORAL_TABLET | Freq: Two times a day (BID) | ORAL | Status: DC
  Filled 2011-08-30 (×2): qty 1

## 2011-08-30 MED ORDER — METHOCARBAMOL 500 MG PO TABS: 500.0000 mg | ORAL_TABLET | Freq: Four times a day (QID) | ORAL | Status: AC | PRN

## 2011-08-30 MED ORDER — RIVAROXABAN 10 MG PO TABS: 10.0000 mg | ORAL_TABLET | Freq: Every day | ORAL | Status: DC

## 2011-08-30 MED ORDER — FERROUS SULFATE 325 (65 FE) MG PO TABS: 325.0000 mg | ORAL_TABLET | Freq: Two times a day (BID) | ORAL | Status: DC

## 2011-08-30 MED ORDER — OXYCODONE HCL 5 MG PO TABS: 5.0000 mg | ORAL_TABLET | ORAL | Status: AC | PRN

## 2011-08-30 NOTE — Progress Notes (Signed)
Patient ID: [name], male   DOB: 10-22-1945, 66 y.o.   MRN: [id number] PATIENT ID: [name]        MRN:  [id number]          DOB/AGE: 1945-08-25 / 66 y.o.  Bobby Campbell, MD   Bobby Code, PA-C [address] [place], [place], [place]  [address]                             [phone]   PROGRESS NOTE  Subjective:  negative for Chest Pain  negative for Shortness of Breath  negative for Nausea/Vomiting   negative for Calf Pain  negative for Bowel Movement   Tolerating Diet: yes         Patient reports pain as mild.    Objective: Vital signs in last 24 hours:   Patient Vitals for the past 24 hrs:  BP Temp Temp src Pulse Resp SpO2  08/30/11 1326 121/63 mmHg 99.2 F (37.3 C) - 75  16  98 %  08/30/11 0627 106/62 mmHg 98.2 F (36.8 C) Oral 78  18  92 %  08/29/11 2229 118/69 mmHg 98.3 F (36.8 C) Oral 82  18  100 %  08/29/11 1400 118/72 mmHg 97.6 F (36.4 C) - 82  18  98 %      Intake/Output from previous day:   04/24 0701 - 04/25 0700 In: 1015 [P.O.:790; I.V.:225] Out: 475 [Drains:475]   Intake/Output this shift:   04/25 0701 - 04/25 1900 In: 480 [P.O.:480] Out: -    Intake/Output      04/24 0701 - 04/25 0700 04/25 0701 - 04/26 0700   P.O. 790 480   I.V. (mL/kg) 225 (2.2)    IV Piggyback     Total Intake(mL/kg) 1015 (9.9) 480 (4.7)   Urine (mL/kg/hr)     Drains 475    Total Output 475    Net +540 +480        Urine Occurrence 4 x       LABORATORY DATA:  Basename 08/30/11 0625 08/29/11 0535  WBC 8.1 12.6*  HGB 7.3* 8.7*  HCT 22.7* 27.1*  PLT 139* 165    Basename 08/30/11 0625 08/29/11 0535  NA 138 136  K 3.9 3.7  CL 104 103  CO2 27 26  BUN 9 10  CREATININE 0.82 0.83  GLUCOSE 102* 133*  CALCIUM 9.2 9.3   Lab Results  Component Value Date   INR 1.11 08/20/2011    Examination:  General appearance: alert, cooperative and no distress  Wound Exam: clean, dry, intact   Drainage:  None: wound tissue dry  Motor Exam: EHL, FHL,  Anterior Tibial and Posterior Tibial Intact  Sensory Exam: Superficial Peroneal, Deep Peroneal and Tibial normal  Vascular Exam: Normal  Assessment:    2 Days Post-Op  Procedure(s) (LRB): TOTAL KNEE ARTHROPLASTY (Right)  ADDITIONAL DIAGNOSIS:  Principal Problem:  *Osteoarthritis of knee  Acute Blood Loss Anemia   Plan: Physical Therapy as ordered Partial Weight Bearing @ 50% (PWB)  DVT Prophylaxis:  Xarelto  DISCHARGE PLAN: Home  DISCHARGE NEEDS: has own equipment   will D\C today, asymptomatic with decreased H&H, independent with ADL's    Verna Hamon W 08/30/2011, 1:56 PM

## 2011-08-30 NOTE — Plan of Care (Signed)
Problem: Discharge Progression Outcomes Goal: Other Discharge Outcomes/Goals Outcome: Completed/Met Date Met:  08/30/11 Pt and wife given discharge instructions and both verbalize understanding of instructions. Pt demonstrates appropriate usage of walker and verbalizes no pain issues. Pt escorted out of building via wheelchair to private vehicle.  Pt home health care set up and in place.

## 2011-08-30 NOTE — Discharge Summary (Signed)
08/30/2011   Bobby Campbell, MD   Bobby Code, PA-C [address] [place], [place], [place]  [address]                             [phone]  PATIENT ID: [name]        MRN:  [id number]          DOB/AGE: 1946/03/09 / 66 y.o.      DISCHARGE SUMMARY  ADMISSION DATE:    08/28/2011 DISCHARGE DATE:   08/30/2011   ADMISSION DIAGNOSIS: osteoarthritis right knee    DISCHARGE DIAGNOSIS:  osteoarthritis right knee    ADDITIONAL DIAGNOSIS: Principal Problem:  *Osteoarthritis of knee Active Problems:  Postoperative anemia due to acute blood loss  Past Medical History  Diagnosis Date  . Arthritis   . PTSD (post-traumatic stress disorder)     controlled    PROCEDURE: Procedure(s): RIGHT TOTAL KNEE ARTHROPLASTY on 08/28/2011  CONSULTS:   NONE  HISTORY: [name] is a very pleasant 66 year old white male who is seen today for evaluation of his right knee. He has been having chronic problems with the right knee and has had multiple cortisone injections, anti-inflammatory medications, and viscosupplementation. These have failed recently and he is now to the point where he is having rather significant severe pain in the right knee which is affecting his ambulation as well as his activities of daily living. He now has pain which is pretty much constant, throbbing, and aching, and occasional sharpness to it. He is now having difficulty with sleeping. 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 Rest has been helpful.  Admitted for TKR.   HOSPITAL COURSE:  [name] is a 66 y.o. admitted on 08/28/2011 and found to have a diagnosis of osteoarthritis right knee.  After appropriate laboratory studies were obtained  they were taken to the operating room on 08/28/2011 and underwent Procedure(s): TOTAL KNEE ARTHROPLASTY.   They were given perioperative antibiotics:  Anti-infectives     Start      Dose/Rate Route Frequency Ordered Stop   08/28/11 1600   ceFAZolin (ANCEF) IVPB 2 g/50 mL premix     Comments: ceFAZolin (ANCEF) IVPB 2 g/50 mL premix (g) 2 g Given 08/28/11 1046       2 g 100 mL/hr over 30 Minutes Intravenous Every 6 hours 08/28/11 1444 08/29/11 0424   08/27/11 1424   ceFAZolin (ANCEF) IVPB 2 g/50 mL premix        2 g 100 mL/hr over 30 Minutes Intravenous 60 min pre-op 08/27/11 1424 08/28/11 1046        .  Tolerated the procedure well.  Placed with a foley intraoperatively.  Given Ofirmev at induction and for 48 hours.    POD #1, allowed out of bed to a chair.  PT for ambulation and exercise program.  Foley D/C'd in morning.  IV saline locked.  O2 discontionued.  POD #2, continued PT and ambulation. Hemovac pulled.  He denied any SOB, weakness, dizziness or unsteady gait with PT as well as with rest.  Performed PT well above normal with both exercise as well as with distance of ambulation. . The remainder of the hospital course was dedicated to ambulation and strengthening.   The patient was discharged on 2 Days Post-Op in  Stable condition.  Blood products given:none  DIAGNOSTIC STUDIES:  Recent vital signs:  Patient Vitals for the past 24 hrs:  BP Temp Temp src Pulse Resp SpO2  08/30/11 1326 121/63 mmHg 99.2 F (37.3 C) - 75  16  98 %  08/30/11 0627 106/62 mmHg 98.2 F (36.8 C) Oral 78  18  92 %  Sep 25, 2011 2229 118/69 mmHg 98.3 F (36.8 C) Oral 82  18  100 %       Recent laboratory studies:  [facility] 08/30/11 0625 2011/09/25 0535  WBC 8.1 12.6*  HGB 7.3* 8.7*  HCT 22.7* 27.1*  PLT 139* 165    Basename 08/30/11 0625 2011/09/25 0535  NA 138 136  K 3.9 3.7  CL 104 103  CO2 27 26  BUN 9 10  CREATININE 0.82 0.83  GLUCOSE 102* 133*  CALCIUM 9.2 9.3   Lab Results  Component Value Date   INR 1.11 08/20/2011     Recent Radiographic Studies :   Chest 2 View  08/20/2011  *RADIOLOGY REPORT*  Clinical Data: Preoperative evaluation.  History of smoking.   CHEST - 2 VIEW  Comparison: 10/18/2003.  Findings: Cardiac silhouette is normal size and shape.  Mediastinal and hilar contours appear stable.  There is a generalized hyperinflation configuration with flattening of the diaphragm on lateral image.  No pulmonary edema, pneumonia, or pleural effusion is seen.  There is stable minimal apical pleural thickening which is noncalcific.  Changes of degenerative disc disease and degenerative spondylosis are present.  IMPRESSION: Generalized hyperinflation consistent with obstructive pulmonary disease.  No acute superimposed abnormality is evident.  Stable chronic findings are detailed above.  Original Report Authenticated By: Crawford Givens, M.D.   Nm Myocar Multi W/spect W/wall Motion / Ef  08/27/2011  The *RADIOLOGY REPORT*  Clinical Data:  Abnormal EKG.  Surgical clearance.  MYOCARDIAL IMAGING WITH SPECT (REST AND PHARMACOLOGIC-STRESS) GATED LEFT VENTRICULAR WALL MOTION STUDY LEFT VENTRICULAR EJECTION FRACTION  Technique:  Standard myocardial SPECT imaging was performed after resting intravenous injection of 10 mCi Tc-61m tetrofosmin. Subsequently, intravenous infusion of Lexiscan was performed under the supervision of the Cardiology staff.  At peak effect of the drug, 30 mCi Tc-55m tetrofosmin was injected intravenously and standard myocardial SPECT  imaging was performed.  Quantitative gated imaging was also performed to evaluate left ventricular wall motion, and estimate left ventricular ejection fraction.  Comparison:  None.  Findings: Multiplanar SPECT imaging shows no fixed or reversible perfusion defect within the left ventricular myocardium.  Left ventricular cavity size appears grossly normal.  Imaging obtained with a cardiac gating displayed using a surface rendering algorithm shows no segmental wall motion abnormality.  Left ventricular end-diastolic volume is calculated at 83 ml.  Left ventricular end systolic volume is calculated 29 ml.  The derived left  ventricular ejection fraction is 65%.  IMPRESSION: No evidence for inducible ischemia.  Normal left ventricular ejection fraction.  Original Report Authenticated By: ERIC A. MANSELL, M.D.    DISCHARGE INSTRUCTIONS: Discharge Orders    Future Orders Please Complete By Expires   Diet general      Call MD / Call 911      Comments:   If you experience chest pain or shortness of breath, CALL 911 and be transported to the hospital emergency room.  If you develope a fever above 101 F, pus (white drainage) or increased drainage or redness at the wound, or calf pain, call your surgeon's office.   Constipation Prevention      Comments:   Drink plenty of fluids.  Prune juice  may be helpful.  You may use a stool softener, such as Colace (over the counter) 100 mg twice a day.  Use MiraLax (over the counter) for constipation as needed.   Increase activity slowly as tolerated      Weight Bearing as taught in Physical Therapy      Comments:   Use a walker or crutches as instructed in Physical Therapy (PT)   Patient may shower      Comments:   You may shower without a dressing once there is no drainage.  Do not wash over the wound.  If drainage remains, cover wound with plastic wrap and then shower.   Driving restrictions      Comments:   No driving for 6 weeks   Lifting restrictions      Comments:   No lifting for 6 weeks   CPM      Comments:   Continuous passive motion machine (CPM):      Use the CPM from 0 to 60 for 6-8 hours per day.      You may increase by 5-10 per day.  You may break it up into 2 or 3 sessions per day.      Use CPM for 4 weeks or until you are told to stop.   TED hose      Comments:   Use stockings (TED hose) for 3 weeks on right leg(s).  You may remove them at night for sleeping.   Change dressing      Comments:   Change dressing on Sunday, then change the dressing daily with sterile 4 x 4 inch gauze dressing and apply TED hose.  You may clean the incision with alcohol  prior to redressing.   Do not put a pillow under the knee. Place it under the heel.         DISCHARGE MEDICATIONS:   Medication List  As of 08/30/2011  2:26 PM   STOP taking these medications         aspirin EC 81 MG tablet      JUICE PLUS FIBRE PO      OVER THE COUNTER MEDICATION         TAKE these medications         cholecalciferol 1000 UNITS tablet   Commonly known as: VITAMIN D   Take 1,000 Units by mouth daily.      ferrous sulfate 325 (65 FE) MG tablet   Take 1 tablet (325 mg total) by mouth 2 (two) times daily with a meal.      methocarbamol 500 MG tablet   Commonly known as: ROBAXIN   Take 1 tablet (500 mg total) by mouth every 6 (six) hours as needed (spasms).      oxyCODONE 5 MG immediate release tablet   Commonly known as: Oxy IR/ROXICODONE   Take 1-2 tablets (5-10 mg total) by mouth every 4 (four) hours as needed for pain.      rivaroxaban 10 MG Tabs tablet   Commonly known as: XARELTO   Take 1 tablet (10 mg total) by mouth daily with supper.            FOLLOW UP VISIT:   Follow-up Information    Follow up with [facility], Claude Manges, MD today. (as noted on paperwork from office)    Contact information:   [address] [place] [address] [phone]          DISPOSITION:  Home  CONDITION:  Stable   [name] 08/30/2011, 2:26 PM

## 2011-08-30 NOTE — Progress Notes (Signed)
Physical Therapy Treatment Note   08/30/11 0941  PT Visit Information  Last PT Received On 08/30/11  Assistance Needed +1  PT Time Calculation  PT Start Time 0941  PT Stop Time 1000  PT Time Calculation (min) 19 min  Subjective Data  Subjective Pt received sitting up in chair with report of 7/10 R knee pain.  Precautions  Precautions Knee  Restrictions  RLE Weight Bearing PWB  RLE Partial Weight Bearing Percentage or Pounds 50  Cognition  Overall Cognitive Status Appears within functional limits for tasks assessed/performed  Arousal/Alertness Awake/alert  Orientation Level Oriented X4 / Intact  Behavior During Session [facility] for tasks performed  Bed Mobility  Bed Mobility Not assessed (pt received sitting up in chair)  Transfers  Sit to Stand 5: Supervision;From chair/3-in-1  Stand to Sit 5: Supervision;To chair/3-in-1  Details for Transfer Assistance pt with improved technique this date  Ambulation/Gait  Ambulation/Gait Assistance 5: Supervision  Ambulation Distance (Feet) 150 Feet  Assistive device Rolling walker  Ambulation/Gait Assistance Details pt compliant with R LE PWB 50% this date  Gait Pattern Step-through pattern;Decreased step length - right;Decreased stance time - right  Total Joint Exercises  Ankle Circles/Pumps AROM;Both;10 reps  Quad Sets AROM;Right;10 reps  Heel Slides PROM;Right;10 reps;Seated  Hip ABduction/ADduction AROM;Right;10 reps;Seated (with LEs elevated)  Straight Leg Raises AROM;Right;10 reps;Seated  Long Arc Quad AROM;Right;10 reps;Seated  Knee Flexion AAROM;Right;10 reps;Seated  Marching in Standing AROM;Right;10 reps;Standing  PT - End of Session  Activity Tolerance Patient tolerated treatment well  Patient left in chair;with call bell/phone within reach  Nurse Communication Mobility status  PT - Assessment/Plan  Comments on Treatment Session Patient con't to progress towards all goals. Patient with improved safety this date and  compliance with R LE PWB at 50%. Patient safe to return home this date. Patient with good home set up and support as well as recommended DME.  PT Plan Discharge plan remains appropriate;Frequency remains appropriate  PT Frequency 7X/week  Follow Up Recommendations Home health PT;Supervision/Assistance - 24 hour  Equipment Recommended Rolling walker with 5" wheels (already delivered)  Acute Rehab PT Goals  Time For Goal Achievement 09/05/11  Potential to Achieve Goals Good  PT Goal: Supine/Side to Sit - Progress Progressing toward goal  PT Goal: Sit to Supine/Side - Progress Progressing toward goal  PT Goal: Sit to Stand - Progress Progressing toward goal  PT Goal: Ambulate - Progress Progressing toward goal  PT Goal: Perform Home Exercise Program - Progress Progressing toward goal  Additional Goals  PT Goal: Additional Goal #1 - Progress Met     Pain: 7/10 R knee, pt reports "I didn't stay on top of my pain meds last night."  Lewis Shock, PT, DPT Pager #: [phone] Office #: [phone]

## 2011-08-31 DIAGNOSIS — M1710 Unilateral primary osteoarthritis, unspecified knee: Secondary | ICD-10-CM | POA: Diagnosis not present

## 2011-08-31 DIAGNOSIS — Z96659 Presence of unspecified artificial knee joint: Secondary | ICD-10-CM | POA: Diagnosis not present

## 2011-08-31 DIAGNOSIS — R269 Unspecified abnormalities of gait and mobility: Secondary | ICD-10-CM | POA: Diagnosis not present

## 2011-08-31 DIAGNOSIS — Z471 Aftercare following joint replacement surgery: Secondary | ICD-10-CM | POA: Diagnosis not present

## 2011-08-31 DIAGNOSIS — IMO0001 Reserved for inherently not codable concepts without codable children: Secondary | ICD-10-CM | POA: Diagnosis not present

## 2011-09-03 DIAGNOSIS — R269 Unspecified abnormalities of gait and mobility: Secondary | ICD-10-CM | POA: Diagnosis not present

## 2011-09-03 DIAGNOSIS — Z471 Aftercare following joint replacement surgery: Secondary | ICD-10-CM | POA: Diagnosis not present

## 2011-09-03 DIAGNOSIS — Z96659 Presence of unspecified artificial knee joint: Secondary | ICD-10-CM | POA: Diagnosis not present

## 2011-09-03 DIAGNOSIS — M1710 Unilateral primary osteoarthritis, unspecified knee: Secondary | ICD-10-CM | POA: Diagnosis not present

## 2011-09-03 DIAGNOSIS — IMO0001 Reserved for inherently not codable concepts without codable children: Secondary | ICD-10-CM | POA: Diagnosis not present

## 2011-09-05 DIAGNOSIS — Z471 Aftercare following joint replacement surgery: Secondary | ICD-10-CM | POA: Diagnosis not present

## 2011-09-05 DIAGNOSIS — IMO0001 Reserved for inherently not codable concepts without codable children: Secondary | ICD-10-CM | POA: Diagnosis not present

## 2011-09-05 DIAGNOSIS — M1710 Unilateral primary osteoarthritis, unspecified knee: Secondary | ICD-10-CM | POA: Diagnosis not present

## 2011-09-05 DIAGNOSIS — Z96659 Presence of unspecified artificial knee joint: Secondary | ICD-10-CM | POA: Diagnosis not present

## 2011-09-05 DIAGNOSIS — R269 Unspecified abnormalities of gait and mobility: Secondary | ICD-10-CM | POA: Diagnosis not present

## 2011-09-10 DIAGNOSIS — Z96659 Presence of unspecified artificial knee joint: Secondary | ICD-10-CM | POA: Diagnosis not present

## 2011-09-10 DIAGNOSIS — Z471 Aftercare following joint replacement surgery: Secondary | ICD-10-CM | POA: Diagnosis not present

## 2011-09-10 DIAGNOSIS — M1710 Unilateral primary osteoarthritis, unspecified knee: Secondary | ICD-10-CM | POA: Diagnosis not present

## 2011-09-10 DIAGNOSIS — R269 Unspecified abnormalities of gait and mobility: Secondary | ICD-10-CM | POA: Diagnosis not present

## 2011-09-10 DIAGNOSIS — IMO0001 Reserved for inherently not codable concepts without codable children: Secondary | ICD-10-CM | POA: Diagnosis not present

## 2011-09-12 DIAGNOSIS — M1710 Unilateral primary osteoarthritis, unspecified knee: Secondary | ICD-10-CM | POA: Diagnosis not present

## 2011-09-13 DIAGNOSIS — Z96659 Presence of unspecified artificial knee joint: Secondary | ICD-10-CM | POA: Diagnosis not present

## 2011-09-13 DIAGNOSIS — IMO0001 Reserved for inherently not codable concepts without codable children: Secondary | ICD-10-CM | POA: Diagnosis not present

## 2011-09-13 DIAGNOSIS — R269 Unspecified abnormalities of gait and mobility: Secondary | ICD-10-CM | POA: Diagnosis not present

## 2011-09-13 DIAGNOSIS — Z471 Aftercare following joint replacement surgery: Secondary | ICD-10-CM | POA: Diagnosis not present

## 2011-09-13 DIAGNOSIS — M1710 Unilateral primary osteoarthritis, unspecified knee: Secondary | ICD-10-CM | POA: Diagnosis not present

## 2011-09-14 DIAGNOSIS — Z471 Aftercare following joint replacement surgery: Secondary | ICD-10-CM | POA: Diagnosis not present

## 2011-09-14 DIAGNOSIS — Z96659 Presence of unspecified artificial knee joint: Secondary | ICD-10-CM | POA: Diagnosis not present

## 2011-09-14 DIAGNOSIS — IMO0001 Reserved for inherently not codable concepts without codable children: Secondary | ICD-10-CM | POA: Diagnosis not present

## 2011-09-14 DIAGNOSIS — R269 Unspecified abnormalities of gait and mobility: Secondary | ICD-10-CM | POA: Diagnosis not present

## 2011-09-14 DIAGNOSIS — M1710 Unilateral primary osteoarthritis, unspecified knee: Secondary | ICD-10-CM | POA: Diagnosis not present

## 2011-09-17 DIAGNOSIS — M25569 Pain in unspecified knee: Secondary | ICD-10-CM | POA: Diagnosis not present

## 2011-09-17 DIAGNOSIS — Z96659 Presence of unspecified artificial knee joint: Secondary | ICD-10-CM | POA: Diagnosis not present

## 2011-09-19 DIAGNOSIS — Z96659 Presence of unspecified artificial knee joint: Secondary | ICD-10-CM | POA: Diagnosis not present

## 2011-09-19 DIAGNOSIS — M25569 Pain in unspecified knee: Secondary | ICD-10-CM | POA: Diagnosis not present

## 2011-09-24 DIAGNOSIS — M25569 Pain in unspecified knee: Secondary | ICD-10-CM | POA: Diagnosis not present

## 2011-09-24 DIAGNOSIS — Z96659 Presence of unspecified artificial knee joint: Secondary | ICD-10-CM | POA: Diagnosis not present

## 2011-09-26 DIAGNOSIS — M25569 Pain in unspecified knee: Secondary | ICD-10-CM | POA: Diagnosis not present

## 2011-09-26 DIAGNOSIS — Z96659 Presence of unspecified artificial knee joint: Secondary | ICD-10-CM | POA: Diagnosis not present

## 2011-09-28 DIAGNOSIS — Z96659 Presence of unspecified artificial knee joint: Secondary | ICD-10-CM | POA: Diagnosis not present

## 2011-09-28 DIAGNOSIS — M25569 Pain in unspecified knee: Secondary | ICD-10-CM | POA: Diagnosis not present

## 2011-10-03 DIAGNOSIS — M25569 Pain in unspecified knee: Secondary | ICD-10-CM | POA: Diagnosis not present

## 2011-10-03 DIAGNOSIS — Z96659 Presence of unspecified artificial knee joint: Secondary | ICD-10-CM | POA: Diagnosis not present

## 2011-10-08 DIAGNOSIS — M25569 Pain in unspecified knee: Secondary | ICD-10-CM | POA: Diagnosis not present

## 2011-10-08 DIAGNOSIS — Z96659 Presence of unspecified artificial knee joint: Secondary | ICD-10-CM | POA: Diagnosis not present

## 2011-10-09 DIAGNOSIS — M25569 Pain in unspecified knee: Secondary | ICD-10-CM | POA: Diagnosis not present

## 2011-10-09 DIAGNOSIS — Z96659 Presence of unspecified artificial knee joint: Secondary | ICD-10-CM | POA: Diagnosis not present

## 2011-10-17 DIAGNOSIS — Z96659 Presence of unspecified artificial knee joint: Secondary | ICD-10-CM | POA: Diagnosis not present

## 2011-10-17 DIAGNOSIS — M25569 Pain in unspecified knee: Secondary | ICD-10-CM | POA: Diagnosis not present

## 2011-10-19 DIAGNOSIS — M25569 Pain in unspecified knee: Secondary | ICD-10-CM | POA: Diagnosis not present

## 2011-10-19 DIAGNOSIS — Z96659 Presence of unspecified artificial knee joint: Secondary | ICD-10-CM | POA: Diagnosis not present

## 2011-10-22 DIAGNOSIS — M25569 Pain in unspecified knee: Secondary | ICD-10-CM | POA: Diagnosis not present

## 2011-10-22 DIAGNOSIS — Z96659 Presence of unspecified artificial knee joint: Secondary | ICD-10-CM | POA: Diagnosis not present

## 2011-10-26 DIAGNOSIS — M25569 Pain in unspecified knee: Secondary | ICD-10-CM | POA: Diagnosis not present

## 2011-10-26 DIAGNOSIS — Z96659 Presence of unspecified artificial knee joint: Secondary | ICD-10-CM | POA: Diagnosis not present

## 2011-10-29 DIAGNOSIS — Z96659 Presence of unspecified artificial knee joint: Secondary | ICD-10-CM | POA: Diagnosis not present

## 2011-10-29 DIAGNOSIS — M25569 Pain in unspecified knee: Secondary | ICD-10-CM | POA: Diagnosis not present

## 2011-10-31 DIAGNOSIS — Z96659 Presence of unspecified artificial knee joint: Secondary | ICD-10-CM | POA: Diagnosis not present

## 2011-10-31 DIAGNOSIS — M25569 Pain in unspecified knee: Secondary | ICD-10-CM | POA: Diagnosis not present

## 2012-01-06 ENCOUNTER — Telehealth: Payer: Self-pay

## 2012-01-06 NOTE — Telephone Encounter (Signed)
Pt is having cough and chest congestion and would like for a z pack to be called in for him  Best number [phone]

## 2012-01-07 ENCOUNTER — Emergency Department (HOSPITAL_BASED_OUTPATIENT_CLINIC_OR_DEPARTMENT_OTHER): Payer: Medicare Other

## 2012-01-07 ENCOUNTER — Emergency Department (HOSPITAL_BASED_OUTPATIENT_CLINIC_OR_DEPARTMENT_OTHER)
Admission: EM | Admit: 2012-01-07 | Discharge: 2012-01-07 | Disposition: A | Discharge status: Home / Self Care | Payer: Medicare Other | Attending: Emergency Medicine | Admitting: Emergency Medicine

## 2012-01-07 ENCOUNTER — Encounter (HOSPITAL_BASED_OUTPATIENT_CLINIC_OR_DEPARTMENT_OTHER): Payer: Self-pay | Admitting: *Deleted

## 2012-01-07 DIAGNOSIS — F17200 Nicotine dependence, unspecified, uncomplicated: Secondary | ICD-10-CM | POA: Insufficient documentation

## 2012-01-07 DIAGNOSIS — M129 Arthropathy, unspecified: Secondary | ICD-10-CM | POA: Insufficient documentation

## 2012-01-07 DIAGNOSIS — F4310 Post-traumatic stress disorder, unspecified: Secondary | ICD-10-CM | POA: Insufficient documentation

## 2012-01-07 DIAGNOSIS — J189 Pneumonia, unspecified organism: Secondary | ICD-10-CM | POA: Diagnosis not present

## 2012-01-07 MED ORDER — AMOXICILLIN 500 MG PO TABS: 1000.0000 mg | ORAL_TABLET | Freq: Three times a day (TID) | ORAL | Status: AC

## 2012-01-07 MED ORDER — AZITHROMYCIN 250 MG PO TABS: ORAL_TABLET | ORAL | Status: AC

## 2012-01-07 NOTE — Telephone Encounter (Signed)
Called pt to advise left message for him to call me back.

## 2012-01-07 NOTE — ED Notes (Signed)
Productive cough for 4-5 days. Yellow sputum. Feverish.

## 2012-01-07 NOTE — Telephone Encounter (Signed)
Needs office visit.

## 2012-01-07 NOTE — ED Provider Notes (Signed)
History     CSN: [id number]  Arrival date & time 01/07/12  1108   First MD Initiated Contact with Patient 01/07/12 1216      Chief Complaint  Patient presents with  . Cough    (Consider location/radiation/quality/duration/timing/severity/associated sxs/prior treatment) HPI 66 year old male has several days of a cough with sputum with no shortness of breath, confusion, fever, rash, abdominal pain, chest pain, or other concerns. There is no treatment prior to arrival. He does have leftover inhaler from prior bronchitis but has not had to use it. Does not have history of asthma or COPD. Past Medical History  Diagnosis Date  . Arthritis   . PTSD (post-traumatic stress disorder)     controlled    Past Surgical History  Procedure Date  . Back surgery     2000  . Shoulder arthroscopy w/ rotator cuff repair 2012    right  . Joint replacement      left 2005  . 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 Anesthesia problems Neg Hx   . Hypotension Neg Hx   . Malignant hyperthermia Neg Hx   . Pseudochol deficiency Neg Hx     History  Substance Use Topics  . Smoking status: Current Everyday Smoker -- 0.5 packs/day for 20 years    Types: Cigarettes  . Smokeless tobacco: Not on file  . Alcohol Use: No      Review of Systems 10 Systems reviewed and are negative for acute change except as noted in the HPI. Allergies  Review of patient's allergies indicates no known allergies.  Home Medications   Current Outpatient Rx  Name Route Sig Dispense Refill  . AMOXICILLIN 500 MG PO TABS Oral Take 2 tablets (1,000 mg total) by mouth 3 (three) times daily. 42 tablet 0  . AZITHROMYCIN 250 MG PO TABS  2 po day one, then 1 daily x 4 days 5 tablet 0  . VITAMIN D 1000 UNITS PO TABS Oral Take 1,000 Units by mouth  daily.    [name] FERROUS SULFATE 325 (65 FE) MG PO TABS Oral Take 1 tablet (325 mg total) by mouth 2 (two) times daily with a meal.    . RIVAROXABAN 10 MG PO TABS Oral Take 1 tablet (10 mg total) by mouth daily with supper. 14 tablet 0    BP 124/68  Pulse 86  Temp 98.3 F (36.8 C) (Oral)  Resp 20  SpO2 97%  Physical Exam  Nursing note and vitals reviewed. Constitutional:       Awake, alert, nontoxic appearance.  HENT:  Head: Atraumatic.  Eyes: Right eye exhibits no discharge. Left eye exhibits no discharge.  Neck: Neck supple.  Cardiovascular: Normal rate and regular rhythm.   No murmur heard. Pulmonary/Chest: Effort normal and breath sounds normal. No respiratory distress. He has no wheezes. He has no rales. He exhibits no tenderness.  Abdominal: Soft. There is no tenderness. There is no rebound.  Musculoskeletal: He exhibits no edema and no tenderness.       Baseline ROM, no obvious new focal weakness.  Neurological:       Mental status and motor strength appears baseline for patient and situation.  Skin: No rash noted.  Psychiatric: He has a normal mood and affect.    ED Course  Procedures (including critical  care time)  Labs Reviewed - No data to display No results found.   1. CAP (community acquired pneumonia)       MDM   Patient / Family / Caregiver informed of clinical course, understand medical decision-making process, and agree with plan.       Hurman Horn, MD 01/11/12 2220

## 2012-01-09 NOTE — Telephone Encounter (Signed)
Pt had a visit to the ED on 10/07/11 and was Dx and Tx for PNA.

## 2012-02-05 DIAGNOSIS — M1710 Unilateral primary osteoarthritis, unspecified knee: Secondary | ICD-10-CM | POA: Diagnosis not present

## 2012-02-05 DIAGNOSIS — M25569 Pain in unspecified knee: Secondary | ICD-10-CM | POA: Diagnosis not present

## 2012-03-07 DIAGNOSIS — Z0000 Encounter for general adult medical examination without abnormal findings: Secondary | ICD-10-CM | POA: Diagnosis not present

## 2012-03-07 DIAGNOSIS — D126 Benign neoplasm of colon, unspecified: Secondary | ICD-10-CM | POA: Diagnosis not present

## 2012-03-07 DIAGNOSIS — Z79899 Other long term (current) drug therapy: Secondary | ICD-10-CM | POA: Diagnosis not present

## 2012-07-24 DIAGNOSIS — R209 Unspecified disturbances of skin sensation: Secondary | ICD-10-CM | POA: Diagnosis not present

## 2012-07-24 DIAGNOSIS — IMO0001 Reserved for inherently not codable concepts without codable children: Secondary | ICD-10-CM | POA: Diagnosis not present

## 2012-07-24 DIAGNOSIS — M999 Biomechanical lesion, unspecified: Secondary | ICD-10-CM | POA: Diagnosis not present

## 2012-07-24 DIAGNOSIS — M47817 Spondylosis without myelopathy or radiculopathy, lumbosacral region: Secondary | ICD-10-CM | POA: Diagnosis not present

## 2012-07-24 DIAGNOSIS — M545 Low back pain: Secondary | ICD-10-CM | POA: Diagnosis not present

## 2013-03-12 DIAGNOSIS — Z0000 Encounter for general adult medical examination without abnormal findings: Secondary | ICD-10-CM | POA: Diagnosis not present

## 2013-03-12 DIAGNOSIS — Z23 Encounter for immunization: Secondary | ICD-10-CM | POA: Diagnosis not present

## 2013-03-12 DIAGNOSIS — M549 Dorsalgia, unspecified: Secondary | ICD-10-CM | POA: Diagnosis not present

## 2013-03-12 DIAGNOSIS — N289 Disorder of kidney and ureter, unspecified: Secondary | ICD-10-CM | POA: Diagnosis not present

## 2013-05-23 ENCOUNTER — Emergency Department (HOSPITAL_BASED_OUTPATIENT_CLINIC_OR_DEPARTMENT_OTHER)
Admission: EM | Admit: 2013-05-23 | Discharge: 2013-05-24 | Disposition: A | Discharge status: Home / Self Care | Payer: Medicare Other | Attending: Emergency Medicine | Admitting: Emergency Medicine

## 2013-05-23 ENCOUNTER — Emergency Department (HOSPITAL_BASED_OUTPATIENT_CLINIC_OR_DEPARTMENT_OTHER): Payer: Medicare Other

## 2013-05-23 ENCOUNTER — Encounter (HOSPITAL_BASED_OUTPATIENT_CLINIC_OR_DEPARTMENT_OTHER): Payer: Self-pay | Admitting: Emergency Medicine

## 2013-05-23 DIAGNOSIS — Z79899 Other long term (current) drug therapy: Secondary | ICD-10-CM | POA: Diagnosis not present

## 2013-05-23 DIAGNOSIS — Z7901 Long term (current) use of anticoagulants: Secondary | ICD-10-CM | POA: Diagnosis not present

## 2013-05-23 DIAGNOSIS — J988 Other specified respiratory disorders: Secondary | ICD-10-CM | POA: Diagnosis not present

## 2013-05-23 DIAGNOSIS — Z8739 Personal history of other diseases of the musculoskeletal system and connective tissue: Secondary | ICD-10-CM | POA: Insufficient documentation

## 2013-05-23 DIAGNOSIS — J40 Bronchitis, not specified as acute or chronic: Secondary | ICD-10-CM

## 2013-05-23 DIAGNOSIS — F17200 Nicotine dependence, unspecified, uncomplicated: Secondary | ICD-10-CM | POA: Insufficient documentation

## 2013-05-23 DIAGNOSIS — J209 Acute bronchitis, unspecified: Secondary | ICD-10-CM | POA: Insufficient documentation

## 2013-05-23 DIAGNOSIS — Z8659 Personal history of other mental and behavioral disorders: Secondary | ICD-10-CM | POA: Diagnosis not present

## 2013-05-23 DIAGNOSIS — J398 Other specified diseases of upper respiratory tract: Secondary | ICD-10-CM | POA: Diagnosis not present

## 2013-05-23 NOTE — ED Notes (Signed)
Cough and congestion x 2 days

## 2013-05-23 NOTE — ED Provider Notes (Signed)
CSN: [id number]     Arrival date & time 05/23/13  2235 History  This chart was scribed for Jakeia Carreras Alfonso Patten, MD by Maree Erie, ED Scribe. The patient was seen in room MH05/MH05. Patient's care was started at 11:40 PM.     Chief Complaint  Patient presents with  . Cough    Patient is a 68 y.o. male presenting with cough. The history is provided by the patient. No language interpreter was used.  Cough Duration:  2 days Timing:  Constant Progression:  Unchanged Chronicity:  New Smoker: yes   Relieved by:  Nothing Ineffective treatments:  Cough suppressants Associated symptoms: shortness of breath   Associated symptoms: no fever     HPI Comments: TYHEEM BOUGHNER is a 68 y.o. male who presents to the Emergency Department complaining of constant cough that began two days ago. He has associated congestion and intermittent shortness of breath with the cough. He has been using cough syrup without relief. He reports occasional smoking.    Past Medical History  Diagnosis Date  . Arthritis   . PTSD (post-traumatic stress disorder)     controlled   Past Surgical History  Procedure Laterality Date  . Back surgery      2000  . Shoulder arthroscopy w/ rotator cuff repair  2012    right  . Joint replacement       left 2005  . Total knee arthroplasty  08/28/2011    Procedure: TOTAL KNEE ARTHROPLASTY;  Surgeon: Garald Balding, MD;  Location: [facility];  Service: Orthopedics;  Laterality: Right;   Family History  Problem Relation Age of Onset  . Anesthesia problems Neg Hx   . Hypotension Neg Hx   . Malignant hyperthermia Neg Hx   . Pseudochol deficiency Neg Hx    History  Substance Use Topics  . Smoking status: Current Every Day Smoker -- 0.50 packs/day for 20 years    Types: Cigarettes  . Smokeless tobacco: Never Used  . Alcohol Use: No    Review of Systems  Constitutional: Negative for fever.  HENT: Positive for congestion.   Respiratory: Positive for cough and  shortness of breath.   All other systems reviewed and are negative.    Allergies  Review of patient's allergies indicates no known allergies.  Home Medications   Current Outpatient Rx  Name  Route  Sig  Dispense  Refill  . cholecalciferol (VITAMIN D) 1000 UNITS tablet   Oral   Take 1,000 Units by mouth daily.         [name] EXPIRED: ferrous sulfate 325 (65 FE) MG tablet   Oral   Take 1 tablet (325 mg total) by mouth 2 (two) times daily with a meal.         . rivaroxaban (XARELTO) 10 MG TABS tablet   Oral   Take 1 tablet (10 mg total) by mouth daily with supper.   14 tablet   0    Triage Vitals: BP 128/67  Pulse 64  Temp(Src) 98 F (36.7 C) (Oral)  Resp 20  Ht 6\' 2"  (1.88 m)  Wt 220 lb (99.791 kg)  BMI 28.23 kg/m2  SpO2 98%  Physical Exam  Nursing note and vitals reviewed. Constitutional: He is oriented to person, place, and time. He appears well-developed and well-nourished. No distress.  HENT:  Head: Normocephalic and atraumatic.  Mouth/Throat: Oropharynx is clear and moist. No oropharyngeal exudate.  Eyes: EOM are normal.  Neck: Normal range of motion. Neck supple.  Cardiovascular: Normal rate and regular rhythm.   Pulmonary/Chest: Effort normal and breath sounds normal. No respiratory distress. He has no wheezes. He has no rales.  Abdominal: Soft. Bowel sounds are normal. There is no rebound and no guarding.  Musculoskeletal: Normal range of motion.  Neurological: He is alert and oriented to person, place, and time.  Skin: Skin is warm and dry.  Psychiatric: He has a normal mood and affect.    ED Course  Procedures (including critical care time)  DIAGNOSTIC STUDIES: Oxygen Saturation is 98% on room air, normal by my interpretation.    COORDINATION OF CARE: 11:41 PM -Will review chest x-ray. Patient verbalizes understanding and agrees with treatment plan.    Labs Review Labs Reviewed - No data to display Imaging Review Dg Chest 2 View  05/23/2013    CLINICAL DATA:  Cough and congestion for 2 days.  EXAM: CHEST  2 VIEW  COMPARISON:  Chest radiograph from 01/07/2012  FINDINGS: The lungs are well-aerated. Mild bibasilar opacities may reflect atelectasis or possibly mild pneumonia. Nonspecific right-sided pleural thickening is noted. There is no evidence of pleural effusion or pneumothorax.  The heart is normal in size; the mediastinal contour is within normal limits. No acute osseous abnormalities are seen.  IMPRESSION: 1. Mild bibasilar airspace opacities may reflect atelectasis or possibly mild pneumonia. 2. Nonspecific right-sided pleural thickening noted, more prominent than on prior studies. Would correlate for evidence of prior asbestos exposure, and consider further evaluation as deemed clinically appropriate, after completion of treatment of any acute process.   Electronically Signed   By: Garald Balding M.D.   On: 05/23/2013 23:58    EKG Interpretation   None       MDM  No diagnosis found. COPD history and states he take antibiotics for cough.    I personally performed the services described in this documentation, which was scribed in my presence. The recorded information has been reviewed and is accurate.    Carlisle Beers, MD 05/24/13 [phone]

## 2013-05-24 MED ORDER — DOXYCYCLINE HYCLATE 100 MG PO CAPS: 100.0000 mg | ORAL_CAPSULE | Freq: Two times a day (BID) | ORAL | Status: DC

## 2013-05-24 NOTE — Discharge Instructions (Signed)
Bronchitis °Bronchitis is swelling (inflammation) of the air tubes leading to your lungs (bronchi). This causes mucus and a cough. If the swelling gets bad, you may have trouble breathing. °HOME CARE  °· Rest. °· Drink enough fluids to keep your pee (urine) clear or pale yellow (unless you have a condition where you have to watch how much you drink). °· Only take medicine as told by your doctor. If you were given antibiotic medicines, finish them even if you start to feel better. °· Avoid smoke, irritating chemicals, and strong smells. These make the problem worse. Quit smoking if you smoke. This helps your lungs heal faster. °· Use a cool mist humidifier. Change the water in the humidifier every day. You can also sit in the bathroom with hot shower running for 5 10 minutes. Keep the door closed. °· See your health care provider as told. °· Wash your hands often. °GET HELP IF: °Your problems do not get better after 1 week. °GET HELP RIGHT AWAY IF:  °· Your fever gets worse. °· You have chills. °· Your chest hurts. °· Your problems breathing get worse. °· You have blood in your mucus. °· You pass out (faint). °· You feel lightheaded. °· You have a bad headache. °· You throw up (vomit) again and again. °MAKE SURE YOU: °· Understand these instructions. °· Will watch your condition. °· Will get help right away if you are not doing well or get worse. °Document Released: 10/10/2007 Document Revised: 02/11/2013 Document Reviewed: 12/16/2012 °[employer]® Patient Information ©2014 [employer]. ° °

## 2013-06-09 DIAGNOSIS — J189 Pneumonia, unspecified organism: Secondary | ICD-10-CM | POA: Diagnosis not present

## 2013-08-29 IMAGING — CR DG CHEST 2V
2 series · 2 of 2 positions shown · non-contrast
Comparison: 10/18/2003.

CLINICAL DATA: Preoperative evaluation.  History of smoking.

CHEST - 2 VIEW

[view not recorded (1 of 2)]
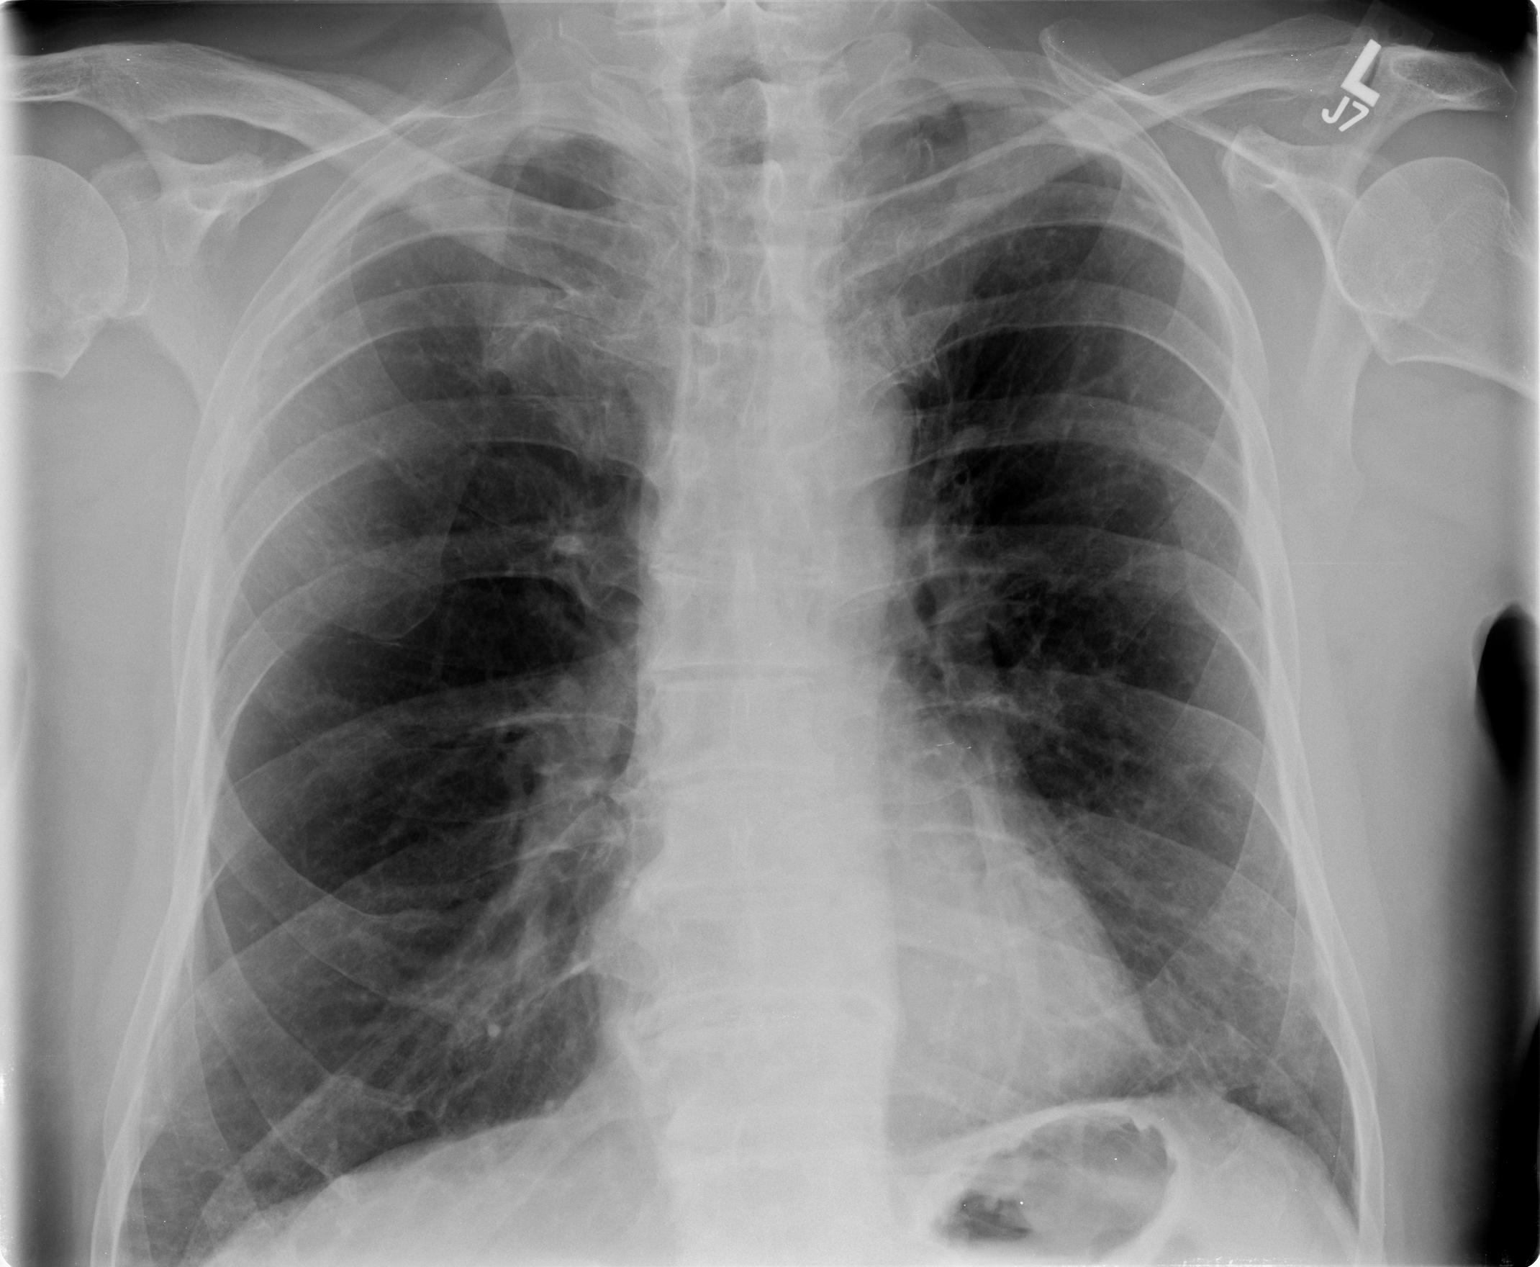

[view not recorded (2 of 2)]
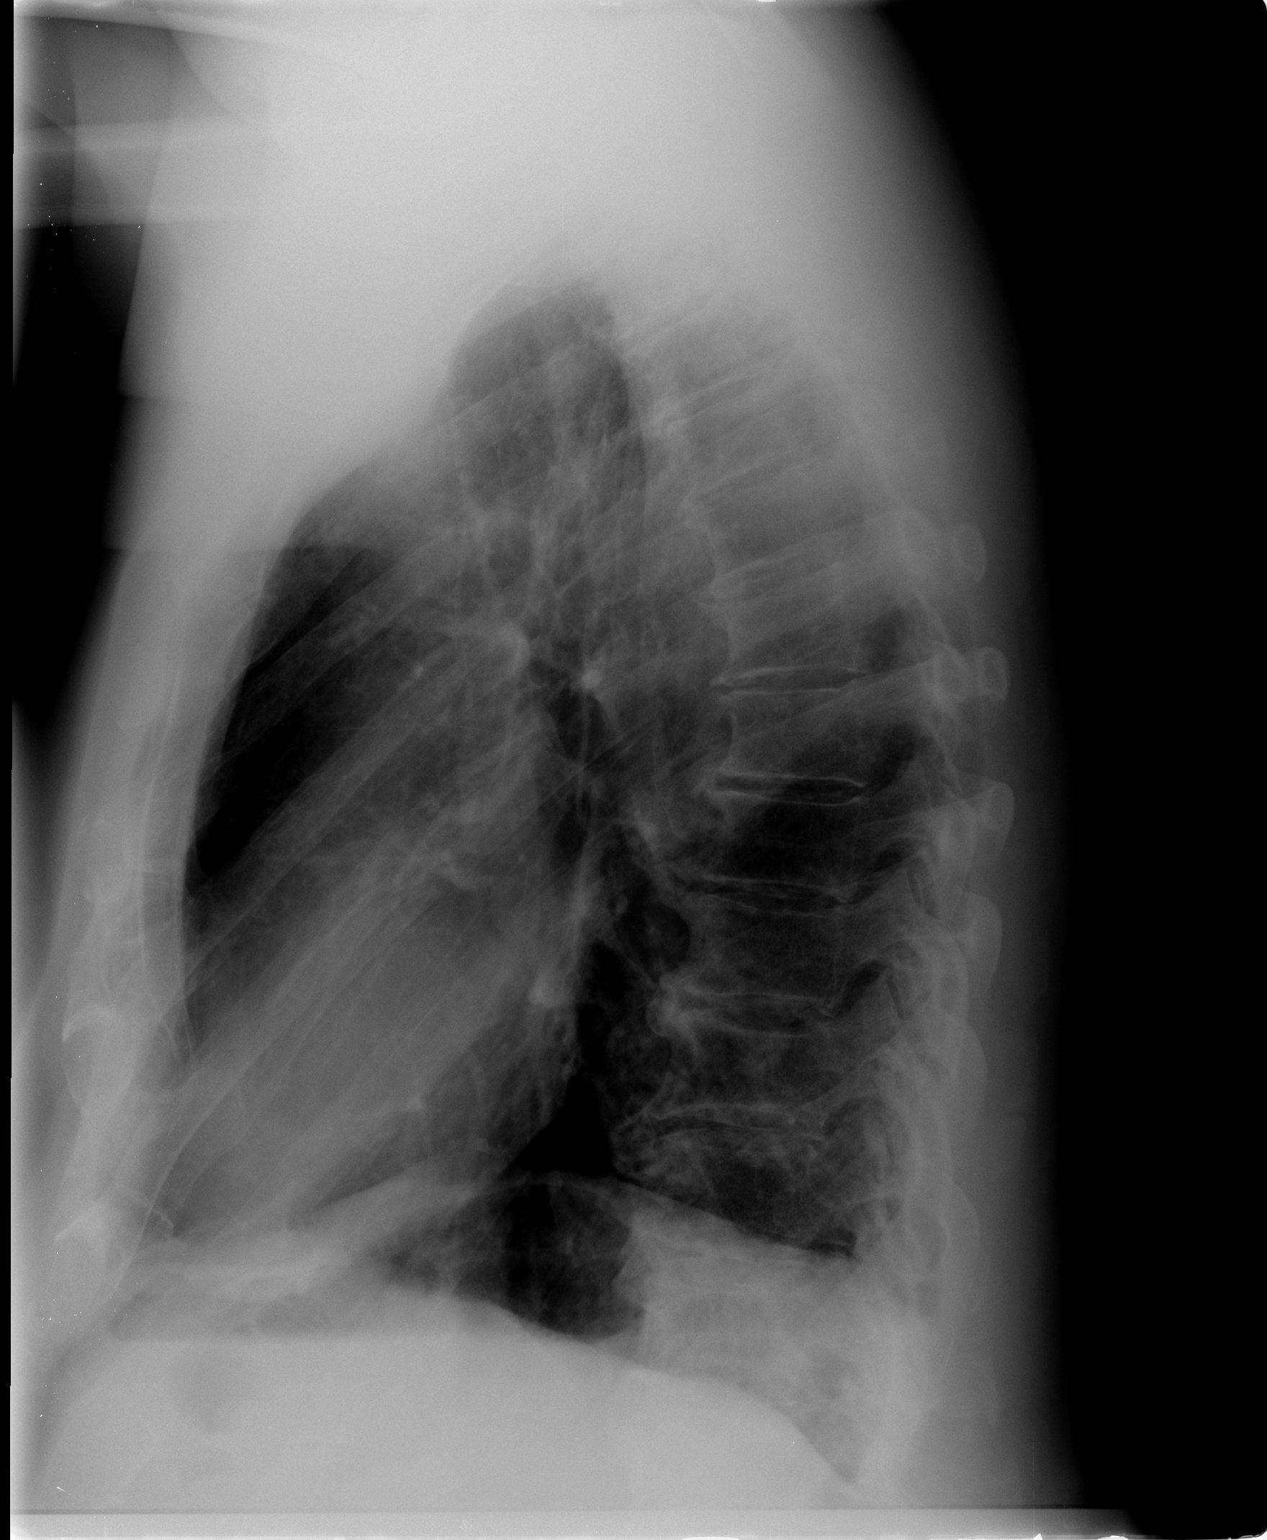

[2 of 2 positions shown; findings below may reference images not displayed]

FINDINGS: Cardiac silhouette is normal size and shape.  Mediastinal
and hilar contours appear stable.  There is a generalized
hyperinflation configuration with flattening of the diaphragm on
lateral image.  No pulmonary edema, pneumonia, or pleural effusion
is seen.  There is stable minimal apical pleural thickening which
is noncalcific.  Changes of degenerative disc disease and
degenerative spondylosis are present.
IMPRESSION: Generalized hyperinflation consistent with obstructive pulmonary
disease.  No acute superimposed abnormality is evident.  Stable
chronic findings are detailed above.

## 2014-01-07 DIAGNOSIS — L570 Actinic keratosis: Secondary | ICD-10-CM | POA: Diagnosis not present

## 2014-01-07 DIAGNOSIS — L723 Sebaceous cyst: Secondary | ICD-10-CM | POA: Diagnosis not present

## 2014-01-07 DIAGNOSIS — D1801 Hemangioma of skin and subcutaneous tissue: Secondary | ICD-10-CM | POA: Diagnosis not present

## 2014-01-07 DIAGNOSIS — D239 Other benign neoplasm of skin, unspecified: Secondary | ICD-10-CM | POA: Diagnosis not present

## 2014-01-07 DIAGNOSIS — L819 Disorder of pigmentation, unspecified: Secondary | ICD-10-CM | POA: Diagnosis not present

## 2014-01-07 DIAGNOSIS — L821 Other seborrheic keratosis: Secondary | ICD-10-CM | POA: Diagnosis not present

## 2014-01-08 DIAGNOSIS — M719 Bursopathy, unspecified: Secondary | ICD-10-CM | POA: Diagnosis not present

## 2014-01-08 DIAGNOSIS — M67919 Unspecified disorder of synovium and tendon, unspecified shoulder: Secondary | ICD-10-CM | POA: Diagnosis not present

## 2014-01-08 DIAGNOSIS — M19019 Primary osteoarthritis, unspecified shoulder: Secondary | ICD-10-CM | POA: Diagnosis not present

## 2014-07-20 ENCOUNTER — Emergency Department (HOSPITAL_BASED_OUTPATIENT_CLINIC_OR_DEPARTMENT_OTHER): Payer: Medicare Other

## 2014-07-20 ENCOUNTER — Inpatient Hospital Stay (HOSPITAL_BASED_OUTPATIENT_CLINIC_OR_DEPARTMENT_OTHER)
Admission: EM | Admit: 2014-07-20 | Discharge: 2014-07-22 | DRG: 303 | Disposition: A | Discharge status: Home / Self Care | Payer: Medicare Other | Attending: Cardiology | Admitting: Cardiology

## 2014-07-20 ENCOUNTER — Encounter (HOSPITAL_BASED_OUTPATIENT_CLINIC_OR_DEPARTMENT_OTHER): Payer: Self-pay | Admitting: *Deleted

## 2014-07-20 DIAGNOSIS — F4310 Post-traumatic stress disorder, unspecified: Secondary | ICD-10-CM | POA: Diagnosis not present

## 2014-07-20 DIAGNOSIS — F17210 Nicotine dependence, cigarettes, uncomplicated: Secondary | ICD-10-CM | POA: Diagnosis present

## 2014-07-20 DIAGNOSIS — R079 Chest pain, unspecified: Secondary | ICD-10-CM | POA: Diagnosis not present

## 2014-07-20 DIAGNOSIS — I249 Acute ischemic heart disease, unspecified: Secondary | ICD-10-CM

## 2014-07-20 DIAGNOSIS — I2510 Atherosclerotic heart disease of native coronary artery without angina pectoris: Principal | ICD-10-CM | POA: Diagnosis present

## 2014-07-20 DIAGNOSIS — N200 Calculus of kidney: Secondary | ICD-10-CM | POA: Diagnosis not present

## 2014-07-20 DIAGNOSIS — Z96651 Presence of right artificial knee joint: Secondary | ICD-10-CM | POA: Diagnosis present

## 2014-07-20 DIAGNOSIS — E279 Disorder of adrenal gland, unspecified: Secondary | ICD-10-CM | POA: Diagnosis not present

## 2014-07-20 DIAGNOSIS — M549 Dorsalgia, unspecified: Secondary | ICD-10-CM | POA: Diagnosis not present

## 2014-07-20 DIAGNOSIS — I252 Old myocardial infarction: Secondary | ICD-10-CM | POA: Diagnosis not present

## 2014-07-20 DIAGNOSIS — J8410 Pulmonary fibrosis, unspecified: Secondary | ICD-10-CM | POA: Diagnosis not present

## 2014-07-20 DIAGNOSIS — I248 Other forms of acute ischemic heart disease: Secondary | ICD-10-CM | POA: Diagnosis not present

## 2014-07-20 DIAGNOSIS — M1990 Unspecified osteoarthritis, unspecified site: Secondary | ICD-10-CM | POA: Diagnosis present

## 2014-07-20 DIAGNOSIS — I480 Paroxysmal atrial fibrillation: Secondary | ICD-10-CM | POA: Diagnosis not present

## 2014-07-20 DIAGNOSIS — R0602 Shortness of breath: Secondary | ICD-10-CM | POA: Diagnosis not present

## 2014-07-20 DIAGNOSIS — Z79899 Other long term (current) drug therapy: Secondary | ICD-10-CM

## 2014-07-20 DIAGNOSIS — J479 Bronchiectasis, uncomplicated: Secondary | ICD-10-CM | POA: Diagnosis not present

## 2014-07-20 LAB — COMPREHENSIVE METABOLIC PANEL
ALT: 28 U/L (ref 0–53)
AST: 27 U/L (ref 0–37)
Albumin: 4.3 g/dL (ref 3.5–5.2)
Alkaline Phosphatase: 90 U/L (ref 39–117)
Anion gap: 9 (ref 5–15)
BUN: 28 mg/dL — ABNORMAL HIGH (ref 6–23)
CALCIUM: 10.8 mg/dL — AB (ref 8.4–10.5)
CO2: 25 mmol/L (ref 19–32)
Chloride: 103 mmol/L (ref 96–112)
Creatinine, Ser: 1.33 mg/dL (ref 0.50–1.35)
GFR calc non Af Amer: 53 mL/min — ABNORMAL LOW (ref >90)
GFR, EST AFRICAN AMERICAN: 62 mL/min — AB (ref >90)
Glucose, Bld: 139 mg/dL — ABNORMAL HIGH (ref 70–99)
Potassium: 4.3 mmol/L (ref 3.5–5.1)
SODIUM: 137 mmol/L (ref 135–145)
TOTAL PROTEIN: 7.4 g/dL (ref 6.0–8.3)
Total Bilirubin: 0.9 mg/dL (ref 0.3–1.2)

## 2014-07-20 LAB — CBC WITH DIFFERENTIAL/PLATELET
BASOS ABS: 0 10*3/uL (ref 0.0–0.1)
BASOS PCT: 0 % (ref 0–1)
EOS ABS: 0.1 10*3/uL (ref 0.0–0.7)
Eosinophils Relative: 1 % (ref 0–5)
HCT: 46 % (ref 39.0–52.0)
Hemoglobin: 14.9 g/dL (ref 13.0–17.0)
LYMPHS ABS: 1.3 10*3/uL (ref 0.7–4.0)
Lymphocytes Relative: 10 % — ABNORMAL LOW (ref 12–46)
MCH: 26 pg (ref 26.0–34.0)
MCHC: 32.4 g/dL (ref 30.0–36.0)
MCV: 80.3 fL (ref 78.0–100.0)
Monocytes Absolute: 1.2 10*3/uL — ABNORMAL HIGH (ref 0.1–1.0)
Monocytes Relative: 10 % (ref 3–12)
NEUTROS PCT: 79 % — AB (ref 43–77)
Neutro Abs: 9.9 10*3/uL — ABNORMAL HIGH (ref 1.7–7.7)
PLATELETS: 202 10*3/uL (ref 150–400)
RBC: 5.73 MIL/uL (ref 4.22–5.81)
RDW: 16.1 % — ABNORMAL HIGH (ref 11.5–15.5)
WBC: 12.6 10*3/uL — AB (ref 4.0–10.5)

## 2014-07-20 LAB — TROPONIN I: TROPONIN I: 0.05 ng/mL — AB (ref <0.031)

## 2014-07-20 MED ORDER — AMIODARONE IV BOLUS ONLY 150 MG/100ML
150.0000 mg | Freq: Once | INTRAVENOUS | Status: AC
  Administered 2014-07-21: 150 mg via INTRAVENOUS
  Filled 2014-07-20: qty 100

## 2014-07-20 MED ORDER — AMIODARONE HCL IN DEXTROSE 360-4.14 MG/200ML-% IV SOLN
60.0000 mg/h | Freq: Once | INTRAVENOUS | Status: DC
  Filled 2014-07-20: qty 200

## 2014-07-20 MED ORDER — SODIUM CHLORIDE 0.9 % IV BOLUS (SEPSIS)
1000.0000 mL | Freq: Once | INTRAVENOUS | Status: AC
Start: 2014-07-20 — End: 2014-07-21
  Administered 2014-07-21: 1000 mL via INTRAVENOUS

## 2014-07-20 MED ORDER — HEPARIN SODIUM (PORCINE) 5000 UNIT/ML IJ SOLN
4000.0000 [IU] | Freq: Once | INTRAMUSCULAR | Status: AC
  Administered 2014-07-21: 4000 [IU] via INTRAVENOUS
  Filled 2014-07-20: qty 1

## 2014-07-20 MED ORDER — HEPARIN (PORCINE) IN NACL 100-0.45 UNIT/ML-% IJ SOLN
1200.0000 [IU]/h | INTRAMUSCULAR | Status: DC
  Administered 2014-07-21: 1200 [IU]/h via INTRAVENOUS
  Filled 2014-07-20 (×2): qty 250

## 2014-07-20 MED ORDER — NITROGLYCERIN 0.4 MG SL SUBL: 0.4000 mg | SUBLINGUAL_TABLET | SUBLINGUAL | Status: DC | PRN

## 2014-07-20 MED ORDER — HEPARIN (PORCINE) IN NACL 100-0.45 UNIT/ML-% IJ SOLN: 12.0000 [IU]/kg/h | Freq: Once | INTRAMUSCULAR | Status: DC

## 2014-07-20 MED ORDER — IOHEXOL 350 MG/ML SOLN
100.0000 mL | Freq: Once | INTRAVENOUS | Status: AC | PRN
  Administered 2014-07-20: 100 mL via INTRAVENOUS

## 2014-07-20 MED ORDER — ASPIRIN 81 MG PO CHEW
324.0000 mg | CHEWABLE_TABLET | Freq: Once | ORAL | Status: AC
  Administered 2014-07-20: 324 mg via ORAL
  Filled 2014-07-20: qty 4

## 2014-07-20 NOTE — ED Provider Notes (Addendum)
CSN: [id number]     Arrival date & time 07/20/14  2155 History  This chart was scribed for Evelina Bucy, MD by Tula Nakayama, ED Scribe. This patient was seen in room MH09/MH09 and the patient's care was started at 10:13 PM.    Chief Complaint  Patient presents with  . Weakness   Patient is a 69 y.o. male presenting with chest pain. The history is provided by the patient. No language interpreter was used.  Chest Pain Pain location:  L chest Pain quality: sharp   Pain radiates to:  Mid back Pain radiates to the back: yes   Pain severity:  Moderate Onset quality:  Gradual Duration:  1 day Timing:  Constant Chronicity:  New Context: movement and at rest   Relieved by:  None tried Worsened by:  Nothing tried Ineffective treatments:  None tried Associated symptoms: back pain, numbness and shortness of breath   Associated symptoms: no nausea     HPI Comments: [name] is a 69 y.o. male with no chronic medical conditions who presents to the Emergency Department complaining of constant, moderate sharp back pain that radiates through to his chest, started last night and became worse today. He states numbness in both of his hands and intermittent neck pain as associated symptoms. He also reports increased SOB with exertion earlier today while he was playing tennis. Pt did not try any treatment for his symptoms PTA. He denies nausea.  Pt reports sudden onset generalized weakness, SOB and diaphoresis that occurred 4 days ago while he was walking to his car. He states increased dizziness and light-headedness with exertion as associated symptoms. Pt notes improvement in symptoms the next day. He denies a history of similar episodes.  Past Medical History  Diagnosis Date  . Arthritis   . PTSD (post-traumatic stress disorder)     controlled   Past Surgical History  Procedure Laterality Date  . Back surgery      2000  . Shoulder arthroscopy w/ rotator cuff repair  2012    right  .  Joint replacement       left 2005  . Total knee arthroplasty  08/28/2011    Procedure: TOTAL KNEE ARTHROPLASTY;  Surgeon: Garald Balding, MD;  Location: [facility];  Service: Orthopedics;  Laterality: Right;   Family History  Problem Relation Age of Onset  . Anesthesia problems Neg Hx   . Hypotension Neg Hx   . Malignant hyperthermia Neg Hx   . Pseudochol deficiency Neg Hx    History  Substance Use Topics  . Smoking status: Current Every Day Smoker -- 0.50 packs/day for 20 years    Types: Cigarettes  . Smokeless tobacco: Never Used  . Alcohol Use: No    Review of Systems  Respiratory: Positive for shortness of breath.   Cardiovascular: Positive for chest pain.  Gastrointestinal: Negative for nausea.  Musculoskeletal: Positive for back pain.  Neurological: Positive for numbness.  All other systems reviewed and are negative.  Allergies  Review of patient's allergies indicates no known allergies.  Home Medications   Prior to Admission medications   Medication Sig Start Date End Date Taking? Authorizing Provider  cholecalciferol (VITAMIN D) 1000 UNITS tablet Take 1,000 Units by mouth daily.    Historical Provider, MD  doxycycline (VIBRAMYCIN) 100 MG capsule Take 1 capsule (100 mg total) by mouth 2 (two) times daily. One po bid x 7 days 05/24/13   April Palumbo, MD  ferrous sulfate 325 (65 FE) MG  tablet Take 1 tablet (325 mg total) by mouth 2 (two) times daily with a meal. 08/30/11 08/29/12  Cherylann Ratel, PA-C  rivaroxaban (XARELTO) 10 MG TABS tablet Take 1 tablet (10 mg total) by mouth daily with supper. 08/30/11   Mike Craze Petrarca, PA-C   BP 101/54 mmHg  Pulse 100  Temp(Src) 97.7 F (36.5 C) (Oral)  Resp 20  Ht 6\' 2"  (1.88 m)  Wt 220 lb (99.791 kg)  BMI 28.23 kg/m2  SpO2 100% Physical Exam  Constitutional: He is oriented to person, place, and time. He appears well-developed and well-nourished. No distress.  HENT:  Head: Normocephalic and atraumatic.  Mouth/Throat:  Oropharynx is clear and moist. No oropharyngeal exudate.  Eyes: EOM are normal. Pupils are equal, round, and reactive to light.  Neck: Normal range of motion. Neck supple.  Cardiovascular: Normal rate and regular rhythm.  Exam reveals no friction rub.   No murmur heard. Pulmonary/Chest: Effort normal and breath sounds normal. No respiratory distress. He has no wheezes. He has no rales.  Abdominal: Soft. He exhibits no distension. There is no tenderness. There is no rebound.  Musculoskeletal: Normal range of motion. He exhibits no edema.  Neurological: He is alert and oriented to person, place, and time. No cranial nerve deficit. He exhibits normal muscle tone. Coordination normal.  Skin: No rash noted. He is not diaphoretic.    ED Course  Procedures   DIAGNOSTIC STUDIES: Oxygen Saturation is 100% on RA, normal by my interpretation.    COORDINATION OF CARE: 10:29 PM Discussed treatment plan with pt at bedside and pt agreed to plan.   Labs Review Labs Reviewed  CBC WITH DIFFERENTIAL/PLATELET  COMPREHENSIVE METABOLIC PANEL  TROPONIN I    Imaging Review Ct Angio Chest Aorta W/cm &/or Wo/cm  07/20/2014   CLINICAL DATA:  Back pain and chest pain, as well as upper extremity paresthesias. Worsening dyspnea with exertion.  EXAM: CT ANGIOGRAPHY CHEST, ABDOMEN AND PELVIS  TECHNIQUE: Multidetector CT imaging through the chest, abdomen and pelvis was performed using the standard protocol during bolus administration of intravenous contrast. Multiplanar reconstructed images and MIPs were obtained and reviewed to evaluate the vascular anatomy.  CONTRAST:  173mL OMNIPAQUE IOHEXOL 350 MG/ML SOLN  COMPARISON:  None.  FINDINGS: CTA CHEST FINDINGS  The thoracic aorta is normal in caliber and intact. There is no dissection. There is no aneurysm. Great vessels are normal in caliber and widely patent. Pulmonary vasculature is well opacified with no evidence of embolism. There is extensive interstitial  coarsening in the periphery of both lungs, greatest in the right base. A few patchy peripheral foci of ground-glass attenuation are present in this could represent a component of pneumonitis. There is mild bronchiectasis. There are no pericardial or pleural effusions. Heart size is normal. No mediastinal or hilar adenopathy.  Review of the MIP images confirms the above findings.  CTA ABDOMEN AND PELVIS FINDINGS  The abdominal aorta is normal in caliber. There is no aneurysm. The major branches off the aorta are also normal in caliber and widely patent. The common iliac arteries, iliac bifurcations, external iliac arteries and common femoral arteries are normal in caliber and widely patent. There are unremarkable arterial phase appearances of the liver, spleen, pancreas and kidneys. There is a low-attenuation 1.9 cm left adrenal nodule, likely benign. There is a lower pole left renal calculus measuring 6 x 8 mm. There is a midpole left renal calculus measuring 4 x 6 mm. There is severe degenerative disc disease  at L2-3 and L3-4. There is severe facet arthritis throughout the lumbar spine. There is grade 1 degenerative spondylolisthesis at L4-5. There is posterior surgical decompression at L5. No acute musculoskeletal abnormalities are evident.  Review of the MIP images confirms the above findings.  IMPRESSION: 1. Normal caliber of the thoracic and abdominal aorta. No dissection. The major branches of the aorta are also normal in caliber and widely patent. 2. Fibrotic appearing interstitial coarsening in both lungs. Mild bronchiectasis. 3. Left nephrolithiasis 4. Low-attenuation 1.9 cm left adrenal nodule, likely benign. 5. Severe degenerative disc and facet changes in the lumbar spine.   Electronically Signed   By: Andreas Newport M.D.   On: 07/20/2014 23:52   Ct Angio Abd/pel W/ And/or W/o  07/20/2014   CLINICAL DATA:  Back pain and chest pain, as well as upper extremity paresthesias. Worsening dyspnea with  exertion.  EXAM: CT ANGIOGRAPHY CHEST, ABDOMEN AND PELVIS  TECHNIQUE: Multidetector CT imaging through the chest, abdomen and pelvis was performed using the standard protocol during bolus administration of intravenous contrast. Multiplanar reconstructed images and MIPs were obtained and reviewed to evaluate the vascular anatomy.  CONTRAST:  178mL OMNIPAQUE IOHEXOL 350 MG/ML SOLN  COMPARISON:  None.  FINDINGS: CTA CHEST FINDINGS  The thoracic aorta is normal in caliber and intact. There is no dissection. There is no aneurysm. Great vessels are normal in caliber and widely patent. Pulmonary vasculature is well opacified with no evidence of embolism. There is extensive interstitial coarsening in the periphery of both lungs, greatest in the right base. A few patchy peripheral foci of ground-glass attenuation are present in this could represent a component of pneumonitis. There is mild bronchiectasis. There are no pericardial or pleural effusions. Heart size is normal. No mediastinal or hilar adenopathy.  Review of the MIP images confirms the above findings.  CTA ABDOMEN AND PELVIS FINDINGS  The abdominal aorta is normal in caliber. There is no aneurysm. The major branches off the aorta are also normal in caliber and widely patent. The common iliac arteries, iliac bifurcations, external iliac arteries and common femoral arteries are normal in caliber and widely patent. There are unremarkable arterial phase appearances of the liver, spleen, pancreas and kidneys. There is a low-attenuation 1.9 cm left adrenal nodule, likely benign. There is a lower pole left renal calculus measuring 6 x 8 mm. There is a midpole left renal calculus measuring 4 x 6 mm. There is severe degenerative disc disease at L2-3 and L3-4. There is severe facet arthritis throughout the lumbar spine. There is grade 1 degenerative spondylolisthesis at L4-5. There is posterior surgical decompression at L5. No acute musculoskeletal abnormalities are  evident.  Review of the MIP images confirms the above findings.  IMPRESSION: 1. Normal caliber of the thoracic and abdominal aorta. No dissection. The major branches of the aorta are also normal in caliber and widely patent. 2. Fibrotic appearing interstitial coarsening in both lungs. Mild bronchiectasis. 3. Left nephrolithiasis 4. Low-attenuation 1.9 cm left adrenal nodule, likely benign. 5. Severe degenerative disc and facet changes in the lumbar spine.   Electronically Signed   By: Andreas Newport M.D.   On: 07/20/2014 23:52     EKG Interpretation   Date/Time:  Tuesday July 20 2014 22:13:36 EDT Ventricular Rate:  106 PR Interval:    QRS Duration: 76 QT Interval:  272 QTC Calculation: 361 R Axis:   -56 Text Interpretation:  Atrial fibrillation with rapid ventricular response  Left axis deviation Low voltage QRS Inferior  infarct , age undetermined  Cannot rule out Anteroseptal infarct , age undetermined Abnormal ECG Afib  new Confirmed by Mingo Amber  MD, Marlan Steward [phone]) on 07/20/2014 11:08:26 PM      CRITICAL CARE Performed by: Osvaldo Shipper   Total critical care time: 30 minutes  Critical care time was exclusive of separately billable procedures and treating other patients.  Critical care was necessary to treat or prevent imminent or life-threatening deterioration.  Critical care was time spent personally by me on the following activities: development of treatment plan with patient and/or surrogate as well as nursing, discussions with consultants, evaluation of patient's response to treatment, examination of patient, obtaining history from patient or surrogate, ordering and performing treatments and interventions, ordering and review of laboratory studies, ordering and review of radiographic studies, pulse oximetry and re-evaluation of patient's condition.  MDM   Final diagnoses:  Back pain  Chest pain, unspecified chest pain type  ACS (acute coronary syndrome)  Paroxysmal  atrial fibrillation    69 year old male here with some chest pain, dyspnea on exertion. Chest pain described as rate 83 from his back. Also radiates to his neck. Has some associated bilateral hand tingling.  Patient reports some shortness of breath, diaphoresis and overall weakness that happened 4 days ago. The next day he was fine which was 3 days ago. He's been having some weakness, dyspnea on exertion today while playing tennis. Chest pain started prior to tennis.  EKG shows A. fib with intermittent rates. He converted back to sinus rhythm on his own. Concern for possible heart failure versus aortic dissection. We'll scan his chest. I reviewed his chest scan, do not see evidence of aortic dissection.   I spoke with Dr. Terrence Dupont, he recommended admission to CCU with amiodarone and heparin infusion. We'll wait for final read of chest CT prior to starting heparin. CT is negative for dissection.  Plan on admission.  I personally performed the services described in this documentation, which was scribed in my presence. The recorded information has been reviewed and is accurate.     Evelina Bucy, MD 07/20/14 2357  Evelina Bucy, MD 07/20/14 7741  Evelina Bucy, MD 07/21/14 [phone]

## 2014-07-20 NOTE — ED Notes (Signed)
Patient transported to CT 

## 2014-07-20 NOTE — ED Notes (Signed)
Weakness for a week. Back and chest pain. Numbness in both hands.

## 2014-07-20 NOTE — ED Notes (Signed)
Dr states to hold Nitro due to pt not in any pain at this time

## 2014-07-20 NOTE — ED Notes (Signed)
MD at bedside. 

## 2014-07-20 NOTE — Progress Notes (Signed)
ANTICOAGULATION CONSULT NOTE - Initial Consult  Pharmacy Consult for Heparin  Indication: chest pain/ACS  No Known Allergies  Patient Measurements: Height: 6\' 2"  (188 cm) Weight: 220 lb (99.791 kg) IBW/kg (Calculated) : 82.2  Vital Signs: Temp: 97.7 F (36.5 C) (03/15 2207) Temp Source: Oral (03/15 2207) BP: 110/65 mmHg (03/15 2316) Pulse Rate: 75 (03/15 2316)  Labs:  Recent Labs  07/20/14 2215  HGB 14.9  HCT 46.0  PLT 202  CREATININE 1.33  TROPONINI 0.05*    Estimated Creatinine Clearance: 67.1 mL/min (by C-G formula based on Cr of 1.33).   Medical History: Past Medical History  Diagnosis Date  . Arthritis   . PTSD (post-traumatic stress disorder)     controlled    Assessment: Heparin for CP/back pain at [facility]. Mildly elevated troponin. CBC good. Renal function good.   Goal of Therapy:  Heparin level 0.3-0.7 units/ml Monitor platelets by anticoagulation protocol: Yes   Plan:  -Heparin 4000 units BOLUS -Start heparin drip at 1200 units/hr -0700 HL -Daily CBC/HL -Monitor for bleeding  Narda Bonds 07/20/2014,11:56 PM

## 2014-07-21 DIAGNOSIS — I4891 Unspecified atrial fibrillation: Secondary | ICD-10-CM | POA: Diagnosis not present

## 2014-07-21 DIAGNOSIS — Z96651 Presence of right artificial knee joint: Secondary | ICD-10-CM | POA: Diagnosis present

## 2014-07-21 DIAGNOSIS — I252 Old myocardial infarction: Secondary | ICD-10-CM | POA: Diagnosis not present

## 2014-07-21 DIAGNOSIS — I480 Paroxysmal atrial fibrillation: Secondary | ICD-10-CM | POA: Diagnosis present

## 2014-07-21 DIAGNOSIS — R7309 Other abnormal glucose: Secondary | ICD-10-CM | POA: Diagnosis not present

## 2014-07-21 DIAGNOSIS — I248 Other forms of acute ischemic heart disease: Secondary | ICD-10-CM | POA: Diagnosis present

## 2014-07-21 DIAGNOSIS — R0789 Other chest pain: Secondary | ICD-10-CM | POA: Diagnosis not present

## 2014-07-21 DIAGNOSIS — F17210 Nicotine dependence, cigarettes, uncomplicated: Secondary | ICD-10-CM | POA: Diagnosis present

## 2014-07-21 DIAGNOSIS — R9431 Abnormal electrocardiogram [ECG] [EKG]: Secondary | ICD-10-CM | POA: Diagnosis not present

## 2014-07-21 DIAGNOSIS — F17290 Nicotine dependence, other tobacco product, uncomplicated: Secondary | ICD-10-CM | POA: Diagnosis not present

## 2014-07-21 DIAGNOSIS — M549 Dorsalgia, unspecified: Secondary | ICD-10-CM | POA: Diagnosis not present

## 2014-07-21 DIAGNOSIS — I2510 Atherosclerotic heart disease of native coronary artery without angina pectoris: Secondary | ICD-10-CM | POA: Diagnosis present

## 2014-07-21 DIAGNOSIS — M1990 Unspecified osteoarthritis, unspecified site: Secondary | ICD-10-CM | POA: Diagnosis present

## 2014-07-21 DIAGNOSIS — F4310 Post-traumatic stress disorder, unspecified: Secondary | ICD-10-CM | POA: Diagnosis present

## 2014-07-21 DIAGNOSIS — R079 Chest pain, unspecified: Secondary | ICD-10-CM | POA: Diagnosis present

## 2014-07-21 DIAGNOSIS — Z79899 Other long term (current) drug therapy: Secondary | ICD-10-CM | POA: Diagnosis not present

## 2014-07-21 LAB — TROPONIN I
TROPONIN I: 0.1 ng/mL — AB (ref <0.031)
TROPONIN I: 0.07 ng/mL — AB (ref <0.031)
TROPONIN I: 0.07 ng/mL — AB (ref <0.031)

## 2014-07-21 LAB — CBC
HCT: 41.2 % (ref 39.0–52.0)
Hemoglobin: 13.3 g/dL (ref 13.0–17.0)
MCH: 25.9 pg — ABNORMAL LOW (ref 26.0–34.0)
MCHC: 32.3 g/dL (ref 30.0–36.0)
MCV: 80.2 fL (ref 78.0–100.0)
PLATELETS: 153 10*3/uL (ref 150–400)
RBC: 5.14 MIL/uL (ref 4.22–5.81)
RDW: 15.4 % (ref 11.5–15.5)
WBC: 7.9 10*3/uL (ref 4.0–10.5)

## 2014-07-21 LAB — COMPREHENSIVE METABOLIC PANEL
ALBUMIN: 3.4 g/dL — AB (ref 3.5–5.2)
ALK PHOS: 72 U/L (ref 39–117)
ALT: 24 U/L (ref 0–53)
AST: 35 U/L (ref 0–37)
Anion gap: 8 (ref 5–15)
BILIRUBIN TOTAL: 1.1 mg/dL (ref 0.3–1.2)
BUN: 14 mg/dL (ref 6–23)
CHLORIDE: 105 mmol/L (ref 96–112)
CO2: 23 mmol/L (ref 19–32)
Calcium: 9.8 mg/dL (ref 8.4–10.5)
Creatinine, Ser: 0.75 mg/dL (ref 0.50–1.35)
GFR calc Af Amer: >90 mL/min (ref >90)
GFR calc non Af Amer: >90 mL/min (ref >90)
Glucose, Bld: 148 mg/dL — ABNORMAL HIGH (ref 70–99)
POTASSIUM: 4.1 mmol/L (ref 3.5–5.1)
Sodium: 136 mmol/L (ref 135–145)
Total Protein: 6.1 g/dL (ref 6.0–8.3)

## 2014-07-21 LAB — TSH: TSH: 1.479 u[IU]/mL (ref 0.350–4.500)

## 2014-07-21 LAB — HEPARIN LEVEL (UNFRACTIONATED)
Heparin Unfractionated: 0.34 IU/mL (ref 0.30–0.70)
Heparin Unfractionated: 0.21 IU/mL — ABNORMAL LOW (ref 0.30–0.70)

## 2014-07-21 LAB — BRAIN NATRIURETIC PEPTIDE: B Natriuretic Peptide: 93.7 pg/mL (ref 0.0–100.0)

## 2014-07-21 LAB — MRSA PCR SCREENING: MRSA BY PCR: NEGATIVE

## 2014-07-21 LAB — MAGNESIUM: MAGNESIUM: 1.9 mg/dL (ref 1.5–2.5)

## 2014-07-21 MED ORDER — NITROGLYCERIN 2 % TD OINT
0.5000 [in_us] | TOPICAL_OINTMENT | Freq: Three times a day (TID) | TRANSDERMAL | Status: DC
  Administered 2014-07-21 – 2014-07-22 (×3): 0.5 [in_us] via TOPICAL
  Filled 2014-07-21: qty 30

## 2014-07-21 MED ORDER — ACETAMINOPHEN 325 MG PO TABS: 650.0000 mg | ORAL_TABLET | ORAL | Status: DC | PRN

## 2014-07-21 MED ORDER — HEPARIN (PORCINE) IN NACL 100-0.45 UNIT/ML-% IJ SOLN
1400.0000 [IU]/h | INTRAMUSCULAR | Status: DC
  Administered 2014-07-21: 1400 [IU]/h via INTRAVENOUS
  Filled 2014-07-21 (×2): qty 250

## 2014-07-21 MED ORDER — ASPIRIN EC 81 MG PO TBEC
81.0000 mg | DELAYED_RELEASE_TABLET | Freq: Every day | ORAL | Status: DC
  Filled 2014-07-21: qty 1

## 2014-07-21 MED ORDER — ATORVASTATIN CALCIUM 80 MG PO TABS
80.0000 mg | ORAL_TABLET | Freq: Every day | ORAL | Status: DC
  Administered 2014-07-21: 80 mg via ORAL
  Filled 2014-07-21 (×3): qty 1

## 2014-07-21 MED ORDER — ASPIRIN 81 MG PO CHEW: 81.0000 mg | CHEWABLE_TABLET | ORAL | Status: AC

## 2014-07-21 MED ORDER — ASPIRIN 300 MG RE SUPP: 300.0000 mg | RECTAL | Status: DC

## 2014-07-21 MED ORDER — ASPIRIN 81 MG PO CHEW: 324.0000 mg | CHEWABLE_TABLET | ORAL | Status: DC

## 2014-07-21 MED ORDER — SODIUM CHLORIDE 0.9 % IV SOLN
1.0000 mL/kg/h | INTRAVENOUS | Status: DC
  Administered 2014-07-21: 1 mL/kg/h via INTRAVENOUS

## 2014-07-21 MED ORDER — SODIUM CHLORIDE 0.9 % IJ SOLN: 3.0000 mL | Freq: Two times a day (BID) | INTRAMUSCULAR | Status: DC

## 2014-07-21 MED ORDER — ONDANSETRON HCL 4 MG/2ML IJ SOLN: 4.0000 mg | Freq: Four times a day (QID) | INTRAMUSCULAR | Status: DC | PRN

## 2014-07-21 MED ORDER — PANTOPRAZOLE SODIUM 40 MG PO TBEC
40.0000 mg | DELAYED_RELEASE_TABLET | Freq: Every day | ORAL | Status: DC
  Administered 2014-07-21 – 2014-07-22 (×2): 40 mg via ORAL
  Filled 2014-07-21 (×2): qty 1

## 2014-07-21 MED ORDER — NITROGLYCERIN 0.4 MG SL SUBL: 0.4000 mg | SUBLINGUAL_TABLET | SUBLINGUAL | Status: DC | PRN

## 2014-07-21 MED ORDER — SODIUM CHLORIDE 0.9 % IJ SOLN: 3.0000 mL | INTRAMUSCULAR | Status: DC | PRN

## 2014-07-21 MED ORDER — AMIODARONE HCL 200 MG PO TABS
200.0000 mg | ORAL_TABLET | Freq: Every day | ORAL | Status: DC
  Administered 2014-07-21: 200 mg via ORAL
  Filled 2014-07-21: qty 1

## 2014-07-21 MED ORDER — SODIUM CHLORIDE 0.9 % IV SOLN: 250.0000 mL | INTRAVENOUS | Status: DC | PRN

## 2014-07-21 MED ORDER — AMIODARONE HCL 200 MG PO TABS
200.0000 mg | ORAL_TABLET | Freq: Two times a day (BID) | ORAL | Status: DC
  Administered 2014-07-21: 200 mg via ORAL
  Filled 2014-07-21 (×3): qty 1

## 2014-07-21 MED ORDER — CLOPIDOGREL BISULFATE 300 MG PO TABS
300.0000 mg | ORAL_TABLET | Freq: Once | ORAL | Status: AC
  Administered 2014-07-21: 300 mg via ORAL
  Filled 2014-07-21: qty 1

## 2014-07-21 MED ORDER — SODIUM CHLORIDE 0.9 % IV SOLN: INTRAVENOUS | Status: DC

## 2014-07-21 MED ORDER — ALPRAZOLAM 0.25 MG PO TABS: 0.2500 mg | ORAL_TABLET | Freq: Two times a day (BID) | ORAL | Status: DC | PRN

## 2014-07-21 NOTE — Care Management Note (Signed)
    Page 1 of 1   07/21/2014     9:30:03 AM CARE MANAGEMENT NOTE 07/21/2014  Patient:  [name], [name]   Account Number:  [id number]  Date Initiated:  07/21/2014  Documentation initiated by:  Elissa Hefty  Subjective/Objective Assessment:   adm w at fib, sl pos troponin     Action/Plan:   lives w wife, pcp dr ed green   Anticipated DC Date:     Anticipated DC Plan:  HOME/SELF CARE         Choice offered to / List presented to:             Status of service:   Medicare Important Message given?   (If response is "NO", the following Medicare IM given date fields will be blank) Date Medicare IM given:   Medicare IM given by:   Date Additional Medicare IM given:   Additional Medicare IM given by:    Discharge Disposition:    Per UR Regulation:  Reviewed for med. necessity/level of care/duration of stay  If discussed at [place] of Stay Meetings, dates discussed:    Comments:

## 2014-07-21 NOTE — Progress Notes (Signed)
Called Dr Terrence Dupont. MD responded that if pt is stable he will check on him within the next hour.

## 2014-07-21 NOTE — Progress Notes (Addendum)
ANTICOAGULATION CONSULT NOTE - Follow Up Consult  Pharmacy Consult for heparin Indication: chest pain/ACS  No Known Allergies  Patient Measurements: Height: 6\' 2"  (188 cm) Weight: 225 lb 1.4 oz (102.1 kg) IBW/kg (Calculated) : 82.2  Vital Signs: Temp: 98.5 F (36.9 C) (03/16 0800) Temp Source: Oral (03/16 0800) BP: 131/87 mmHg (03/16 0715) Pulse Rate: 61 (03/16 0715)  Labs:  Recent Labs  07/20/14 2215 07/21/14 0820  HGB 14.9  --   HCT 46.0  --   PLT 202  --   HEPARINUNFRC  --  0.34  CREATININE 1.33  --   TROPONINI 0.05* 0.10*    Estimated Creatinine Clearance: 67.8 mL/min (by C-G formula based on Cr of 1.33).   Medications:  Scheduled:   Infusions:  . heparin 1,200 Units/hr (07/21/14 0800)    Assessment: 69 yo male on heparin at 1200 units/hr and heparin level is noted at goal (HL= 0.34). Patient noted for cath.   Goal of Therapy:  Heparin level 0.3-0.7 units/ml Monitor platelets by anticoagulation protocol: Yes   Plan:  -No heparin changes needed -Will follow plans post cath  Hildred Laser, Pharm D 07/21/2014 10:08 AM    ===========================   Addendum: - HL 0.21, sub-therapeutic - CBC stable - no issue with infusion, no bleeding per RN   Plan: - Increase heparin gtt to 1400 units/hr - F/U AM labs    Fumie Fiallo D. Mina Marble, PharmD, BCPS Pager:  [phone] 07/21/2014, 7:54 PM

## 2014-07-21 NOTE — ED Notes (Signed)
Dr Reather Converse advised to hold Amiodarone due to pt BP after bolus.

## 2014-07-21 NOTE — H&P (Signed)
[name] is an 69 y.o. male.   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 Patient complains of shortness of breath associated with feeling weak dizzy and clammy and tired easily for last few days while walking in the parking lot. Patient states normally he is very active and plays tennis without any problems but lately also gets vague back pain radiating to the chest and neck pain and occasional numbness in hands. Patient denies any palpitation lightheadedness or syncope. Denies PND orthopnea next swelling. EKG done at [facility] showed A. fib with moderate ventricular response possible old anteroseptal and inferior wall MI age undetermined. Patient was also noted to have minimally elevated troponin I of 0.05. Patient spontaneously converted back into sinus rhythm and received bolus of amiodarone at [facility] and was started on heparin.  Past Medical History  Diagnosis Date  . Arthritis   . PTSD (post-traumatic stress disorder)     controlled    Past Surgical History  Procedure Laterality Date  . Back surgery      2000  . Shoulder arthroscopy w/ rotator cuff repair  2012    right  . Joint replacement       left 2005  . Total knee arthroplasty  08/28/2011    Procedure: TOTAL KNEE ARTHROPLASTY;  Surgeon: Garald Balding, MD;  Location: [place];  Service: Orthopedics;  Laterality: Right;    Family History  Problem Relation Age of Onset  . Anesthesia problems Neg Hx   . Hypotension Neg Hx   . Malignant hyperthermia Neg Hx   . Pseudochol deficiency Neg Hx    Social History:  reports that he has been smoking Cigarettes.  He has a 10 pack-year smoking history. He  has never used smokeless tobacco. He reports that he does not drink alcohol or use illicit drugs.  Allergies: No Known Allergies  Medications Prior to Admission  Medication Sig Dispense Refill  . cholecalciferol (VITAMIN D) 1000 UNITS tablet Take 1,000 Units by mouth daily.    [name] doxycycline (VIBRAMYCIN) 100 MG capsule Take 1 capsule (100 mg total) by mouth 2 (two) times daily. One po bid x 7 days 14 capsule 0  . ferrous sulfate 325 (65 FE) MG tablet Take 1 tablet (325 mg total) by mouth 2 (two) times daily with a meal.    . rivaroxaban (XARELTO) 10 MG TABS tablet Take 1 tablet (10 mg total) by mouth daily with supper. 14 tablet 0    Results for orders placed or performed during the hospital encounter of 07/20/14 (from the past 48 hour(s))  CBC with Differential     Status: Abnormal   Collection Time: 07/20/14 10:15 PM  Result Value Ref Range   WBC 12.6 (H) 4.0 - 10.5 K/uL   RBC 5.73 4.22 - 5.81 MIL/uL   Hemoglobin 14.9 13.0 - 17.0 g/dL   HCT 46.0 39.0 - 52.0 %   MCV 80.3 78.0 - 100.0 fL   MCH 26.0 26.0 - 34.0 pg   MCHC 32.4 30.0 - 36.0 g/dL   RDW 16.1 (H) 11.5 - 15.5 %   Platelets 202 150 - 400 K/uL   Neutrophils Relative % 79 (H) 43 - 77 %   Neutro Abs 9.9 (  H) 1.7 - 7.7 K/uL   Lymphocytes Relative 10 (L) 12 - 46 %   Lymphs Abs 1.3 0.7 - 4.0 K/uL   Monocytes Relative 10 3 - 12 %   Monocytes Absolute 1.2 (H) 0.1 - 1.0 K/uL   Eosinophils Relative 1 0 - 5 %   Eosinophils Absolute 0.1 0.0 - 0.7 K/uL   Basophils Relative 0 0 - 1 %   Basophils Absolute 0.0 0.0 - 0.1 K/uL  Comprehensive metabolic panel     Status: Abnormal   Collection Time: 07/20/14 10:15 PM  Result Value Ref Range   Sodium 137 135 - 145 mmol/L   Potassium 4.3 3.5 - 5.1 mmol/L   Chloride 103 96 - 112 mmol/L   CO2 25 19 - 32 mmol/L   Glucose, Bld 139 (H) 70 - 99 mg/dL   BUN 28 (H) 6 - 23 mg/dL   Creatinine, Ser 1.33 0.50 - 1.35 mg/dL   Calcium 10.8 (H) 8.4 - 10.5 mg/dL   Total Protein 7.4 6.0 - 8.3 g/dL    Albumin 4.3 3.5 - 5.2 g/dL   AST 27 0 - 37 U/L   ALT 28 0 - 53 U/L   Alkaline Phosphatase 90 39 - 117 U/L   Total Bilirubin 0.9 0.3 - 1.2 mg/dL   GFR calc non Af Amer 53 (L) >90 mL/min   GFR calc Af Amer 62 (L) >90 mL/min    Comment: (NOTE) The eGFR has been calculated using the CKD EPI equation. This calculation has not been validated in all clinical situations. eGFR's persistently <90 mL/min signify possible Chronic Kidney Disease.    Anion gap 9 5 - 15  Troponin I     Status: Abnormal   Collection Time: 07/20/14 10:15 PM  Result Value Ref Range   Troponin I 0.05 (H) <0.031 ng/mL    Comment:        PERSISTENTLY INCREASED TROPONIN VALUES IN THE RANGE OF 0.04-0.49 ng/mL CAN BE SEEN IN:       -UNSTABLE ANGINA       -CONGESTIVE HEART FAILURE       -MYOCARDITIS       -CHEST TRAUMA       -ARRYHTHMIAS       -LATE PRESENTING MYOCARDIAL INFARCTION       -COPD   CLINICAL FOLLOW-UP RECOMMENDED.   Brain natriuretic peptide     Status: None   Collection Time: 07/20/14 10:15 PM  Result Value Ref Range   B Natriuretic Peptide 93.7 0.0 - 100.0 pg/mL  MRSA PCR Screening     Status: None   Collection Time: 07/21/14  4:32 AM  Result Value Ref Range   MRSA by PCR NEGATIVE NEGATIVE    Comment:        The GeneXpert MRSA Assay (FDA approved for NASAL specimens only), is one component of a comprehensive MRSA colonization surveillance program. It is not intended to diagnose MRSA infection nor to guide or monitor treatment for MRSA infections.    Ct Angio Chest Aorta W/cm &/or Wo/cm  07/20/2014   CLINICAL DATA:  Back pain and chest pain, as well as upper extremity paresthesias. Worsening dyspnea with exertion.  EXAM: CT ANGIOGRAPHY CHEST, ABDOMEN AND PELVIS  TECHNIQUE: Multidetector CT imaging through the chest, abdomen and pelvis was performed using the standard protocol during bolus administration of intravenous contrast. Multiplanar reconstructed images and MIPs were obtained and  reviewed to evaluate the vascular anatomy.  CONTRAST:  172m OMNIPAQUE IOHEXOL 350 MG/ML SOLN  COMPARISON:  None.  FINDINGS: CTA CHEST FINDINGS  The thoracic aorta is normal in caliber and intact. There is no dissection. There is no aneurysm. Great vessels are normal in caliber and widely patent. Pulmonary vasculature is well opacified with no evidence of embolism. There is extensive interstitial coarsening in the periphery of both lungs, greatest in the right base. A few patchy peripheral foci of ground-glass attenuation are present in this could represent a component of pneumonitis. There is mild bronchiectasis. There are no pericardial or pleural effusions. Heart size is normal. No mediastinal or hilar adenopathy.  Review of the MIP images confirms the above findings.  CTA ABDOMEN AND PELVIS FINDINGS  The abdominal aorta is normal in caliber. There is no aneurysm. The major branches off the aorta are also normal in caliber and widely patent. The common iliac arteries, iliac bifurcations, external iliac arteries and common femoral arteries are normal in caliber and widely patent. There are unremarkable arterial phase appearances of the liver, spleen, pancreas and kidneys. There is a low-attenuation 1.9 cm left adrenal nodule, likely benign. There is a lower pole left renal calculus measuring 6 x 8 mm. There is a midpole left renal calculus measuring 4 x 6 mm. There is severe degenerative disc disease at L2-3 and L3-4. There is severe facet arthritis throughout the lumbar spine. There is grade 1 degenerative spondylolisthesis at L4-5. There is posterior surgical decompression at L5. No acute musculoskeletal abnormalities are evident.  Review of the MIP images confirms the above findings.  IMPRESSION: 1. Normal caliber of the thoracic and abdominal aorta. No dissection. The major branches of the aorta are also normal in caliber and widely patent. 2. Fibrotic appearing interstitial coarsening in both lungs. Mild  bronchiectasis. 3. Left nephrolithiasis 4. Low-attenuation 1.9 cm left adrenal nodule, likely benign. 5. Severe degenerative disc and facet changes in the lumbar spine.   Electronically Signed   By: Andreas Newport M.D.   On: 07/20/2014 23:52   Ct Angio Abd/pel W/ And/or W/o  07/20/2014   CLINICAL DATA:  Back pain and chest pain, as well as upper extremity paresthesias. Worsening dyspnea with exertion.  EXAM: CT ANGIOGRAPHY CHEST, ABDOMEN AND PELVIS  TECHNIQUE: Multidetector CT imaging through the chest, abdomen and pelvis was performed using the standard protocol during bolus administration of intravenous contrast. Multiplanar reconstructed images and MIPs were obtained and reviewed to evaluate the vascular anatomy.  CONTRAST:  194m OMNIPAQUE IOHEXOL 350 MG/ML SOLN  COMPARISON:  None.  FINDINGS: CTA CHEST FINDINGS  The thoracic aorta is normal in caliber and intact. There is no dissection. There is no aneurysm. Great vessels are normal in caliber and widely patent. Pulmonary vasculature is well opacified with no evidence of embolism. There is extensive interstitial coarsening in the periphery of both lungs, greatest in the right base. A few patchy peripheral foci of ground-glass attenuation are present in this could represent a component of pneumonitis. There is mild bronchiectasis. There are no pericardial or pleural effusions. Heart size is normal. No mediastinal or hilar adenopathy.  Review of the MIP images confirms the above findings.  CTA ABDOMEN AND PELVIS FINDINGS  The abdominal aorta is normal in caliber. There is no aneurysm. The major branches off the aorta are also normal in caliber and widely patent. The common iliac arteries, iliac bifurcations, external iliac arteries and common femoral arteries are normal in caliber and widely patent. There are unremarkable arterial phase appearances of the liver, spleen, pancreas and kidneys. There is a low-attenuation  1.9 cm left adrenal nodule, likely  benign. There is a lower pole left renal calculus measuring 6 x 8 mm. There is a midpole left renal calculus measuring 4 x 6 mm. There is severe degenerative disc disease at L2-3 and L3-4. There is severe facet arthritis throughout the lumbar spine. There is grade 1 degenerative spondylolisthesis at L4-5. There is posterior surgical decompression at L5. No acute musculoskeletal abnormalities are evident.  Review of the MIP images confirms the above findings.  IMPRESSION: 1. Normal caliber of the thoracic and abdominal aorta. No dissection. The major branches of the aorta are also normal in caliber and widely patent. 2. Fibrotic appearing interstitial coarsening in both lungs. Mild bronchiectasis. 3. Left nephrolithiasis 4. Low-attenuation 1.9 cm left adrenal nodule, likely benign. 5. Severe degenerative disc and facet changes in the lumbar spine.   Electronically Signed   By: Andreas Newport M.D.   On: 07/20/2014 23:52    Review of Systems  Constitutional: Negative for fever and chills.  HENT: Negative for hearing loss.   Eyes: Negative for blurred vision and double vision.  Respiratory: Positive for shortness of breath. Negative for cough, hemoptysis and sputum production.   Cardiovascular: Positive for chest pain. Negative for palpitations, orthopnea, claudication and leg swelling.  Gastrointestinal: Negative for nausea, vomiting and abdominal pain.  Genitourinary: Negative for dysuria.  Neurological: Negative for dizziness and headaches.    Blood pressure 115/61, pulse 53, temperature 98.2 F (36.8 C), temperature source Oral, resp. rate 16, height 6' 2" (1.88 m), weight 102.1 kg (225 lb 1.4 oz), SpO2 93 %. Physical Exam  Constitutional: He is oriented to person, place, and time.  HENT:  Head: Normocephalic and atraumatic.  Eyes: Conjunctivae are normal. Pupils are equal, round, and reactive to light. No scleral icterus.  Neck: Normal range of motion. Neck supple. No JVD present. No  tracheal deviation present. No thyromegaly present.  Cardiovascular: Regular rhythm.   Murmur (soft systolic murmur noted no S3 gallop) heard. Respiratory: Effort normal and breath sounds normal. No respiratory distress. He has no wheezes. He has no rales.  GI: Soft. Bowel sounds are normal. He exhibits no distension. There is no tenderness. There is no rebound.  Musculoskeletal: He exhibits no edema or tenderness.  Neurological: He is alert and oriented to person, place, and time.     Assessment/Plan Status post A. fib with moderate ventricular response converted to sinus rhythm Minimally elevated troponin I probably secondary to demand ischemia Status post atypical chest pain with minimally elevated troponin I rule out coronary insufficiency Coronary artery disease probable old silent MI in the past Degenerative joint disease History of tobacco abuse PTSD Elevated blood sugar rule out diabetes mellitus Plan As per orders Discussed with patient at length regarding left cardiac cath possible PTCA stenting its risk and benefits i.e. death MI stroke need for emergency CABG local vascular complications etc. and consents for PCI  [facility] 07/21/2014, 7:02 AM

## 2014-07-21 NOTE — Progress Notes (Signed)
eMD new arrival camera note  Patient Active Problem List   Diagnosis Date Noted  . Postoperative anemia due to acute blood loss 08/30/2011  . Osteoarthritis of knee 08/28/2011     ICD-9-CM ICD-10-CM   1. Chest pain, unspecified chest pain type 786.50 R07.9   2. Back pain 724.5 M54.9 CT ANGIO CHEST AORTA W/CM &/OR WO/CM     CT Angio Abd/Pel w/ and/or w/o     CT ANGIO CHEST AORTA W/CM &/OR WO/CM     CT Angio Abd/Pel w/ and/or w/o  3. ACS (acute coronary syndrome) 411.1 I24.9   4. Paroxysmal atrial fibrillation 427.31 I48.0    \ [place] exam  looks stable  PULMONARY No results for input(s): PHART, PCO2ART, PO2ART, HCO3, TCO2, O2SAT in the last 168 hours.  Invalid input(s): PCO2, PO2  CBC  Recent Labs Lab 07/20/14 2215  HGB 14.9  HCT 46.0  WBC 12.6*  PLT 202    COAGULATION No results for input(s): INR in the last 168 hours.  CARDIAC   Recent Labs Lab 07/20/14 2215  TROPONINI 0.05*   No results for input(s): PROBNP in the last 168 hours.   CHEMISTRY  Recent Labs Lab 07/20/14 2215  NA 137  K 4.3  CL 103  CO2 25  GLUCOSE 139*  BUN 28*  CREATININE 1.33  CALCIUM 10.8*   Estimated Creatinine Clearance: 67.8 mL/min (by C-G formula based on Cr of 1.33).   LIVER  Recent Labs Lab 07/20/14 2215  AST 27  ALT 28  ALKPHOS 90  BILITOT 0.9  PROT 7.4  ALBUMIN 4.3     INFECTIOUS No results for input(s): LATICACIDVEN, PROCALCITON in the last 168 hours.   ENDOCRINE CBG (last 3)  No results for input(s): GLUCAP in the last 72 hours.       IMAGING x48h Ct Angio Chest Aorta W/cm &/or Wo/cm  07/20/2014   CLINICAL DATA:  Back pain and chest pain, as well as upper extremity paresthesias. Worsening dyspnea with exertion.  EXAM: CT ANGIOGRAPHY CHEST, ABDOMEN AND PELVIS  TECHNIQUE: Multidetector CT imaging through the chest, abdomen and pelvis was performed using the standard protocol during bolus administration of intravenous contrast. Multiplanar  reconstructed images and MIPs were obtained and reviewed to evaluate the vascular anatomy.  CONTRAST:  163mL OMNIPAQUE IOHEXOL 350 MG/ML SOLN  COMPARISON:  None.  FINDINGS: CTA CHEST FINDINGS  The thoracic aorta is normal in caliber and intact. There is no dissection. There is no aneurysm. Great vessels are normal in caliber and widely patent. Pulmonary vasculature is well opacified with no evidence of embolism. There is extensive interstitial coarsening in the periphery of both lungs, greatest in the right base. A few patchy peripheral foci of ground-glass attenuation are present in this could represent a component of pneumonitis. There is mild bronchiectasis. There are no pericardial or pleural effusions. Heart size is normal. No mediastinal or hilar adenopathy.  Review of the MIP images confirms the above findings.  CTA ABDOMEN AND PELVIS FINDINGS  The abdominal aorta is normal in caliber. There is no aneurysm. The major branches off the aorta are also normal in caliber and widely patent. The common iliac arteries, iliac bifurcations, external iliac arteries and common femoral arteries are normal in caliber and widely patent. There are unremarkable arterial phase appearances of the liver, spleen, pancreas and kidneys. There is a low-attenuation 1.9 cm left adrenal nodule, likely benign. There is a lower pole left renal calculus measuring 6 x 8 mm. There is a  midpole left renal calculus measuring 4 x 6 mm. There is severe degenerative disc disease at L2-3 and L3-4. There is severe facet arthritis throughout the lumbar spine. There is grade 1 degenerative spondylolisthesis at L4-5. There is posterior surgical decompression at L5. No acute musculoskeletal abnormalities are evident.  Review of the MIP images confirms the above findings.  IMPRESSION: 1. Normal caliber of the thoracic and abdominal aorta. No dissection. The major branches of the aorta are also normal in caliber and widely patent. 2. Fibrotic appearing  interstitial coarsening in both lungs. Mild bronchiectasis. 3. Left nephrolithiasis 4. Low-attenuation 1.9 cm left adrenal nodule, likely benign. 5. Severe degenerative disc and facet changes in the lumbar spine.   Electronically Signed   By: Andreas Newport M.D.   On: 07/20/2014 23:52   Ct Angio Abd/pel W/ And/or W/o  07/20/2014   CLINICAL DATA:  Back pain and chest pain, as well as upper extremity paresthesias. Worsening dyspnea with exertion.  EXAM: CT ANGIOGRAPHY CHEST, ABDOMEN AND PELVIS  TECHNIQUE: Multidetector CT imaging through the chest, abdomen and pelvis was performed using the standard protocol during bolus administration of intravenous contrast. Multiplanar reconstructed images and MIPs were obtained and reviewed to evaluate the vascular anatomy.  CONTRAST:  127mL OMNIPAQUE IOHEXOL 350 MG/ML SOLN  COMPARISON:  None.  FINDINGS: CTA CHEST FINDINGS  The thoracic aorta is normal in caliber and intact. There is no dissection. There is no aneurysm. Great vessels are normal in caliber and widely patent. Pulmonary vasculature is well opacified with no evidence of embolism. There is extensive interstitial coarsening in the periphery of both lungs, greatest in the right base. A few patchy peripheral foci of ground-glass attenuation are present in this could represent a component of pneumonitis. There is mild bronchiectasis. There are no pericardial or pleural effusions. Heart size is normal. No mediastinal or hilar adenopathy.  Review of the MIP images confirms the above findings.  CTA ABDOMEN AND PELVIS FINDINGS  The abdominal aorta is normal in caliber. There is no aneurysm. The major branches off the aorta are also normal in caliber and widely patent. The common iliac arteries, iliac bifurcations, external iliac arteries and common femoral arteries are normal in caliber and widely patent. There are unremarkable arterial phase appearances of the liver, spleen, pancreas and kidneys. There is a  low-attenuation 1.9 cm left adrenal nodule, likely benign. There is a lower pole left renal calculus measuring 6 x 8 mm. There is a midpole left renal calculus measuring 4 x 6 mm. There is severe degenerative disc disease at L2-3 and L3-4. There is severe facet arthritis throughout the lumbar spine. There is grade 1 degenerative spondylolisthesis at L4-5. There is posterior surgical decompression at L5. No acute musculoskeletal abnormalities are evident.  Review of the MIP images confirms the above findings.  IMPRESSION: 1. Normal caliber of the thoracic and abdominal aorta. No dissection. The major branches of the aorta are also normal in caliber and widely patent. 2. Fibrotic appearing interstitial coarsening in both lungs. Mild bronchiectasis. 3. Left nephrolithiasis 4. Low-attenuation 1.9 cm left adrenal nodule, likely benign. 5. Severe degenerative disc and facet changes in the lumbar spine.   Electronically Signed   By: Andreas Newport M.D.   On: 07/20/2014 23:52     Nil acute interventin by eMD at this time  Dr. Brand Males, M.D., [facility].C.P Pulmonary and Critical Care Medicine Staff Physician [facility] Pager: [phone], If no answer or between  15:00h - 7:00h: call [phone]  07/21/2014 6:08 AM

## 2014-07-21 NOTE — Progress Notes (Signed)
Echocardiogram 2D Echocardiogram has been performed.  [name] 07/21/2014, 12:06 PM

## 2014-07-22 ENCOUNTER — Encounter (HOSPITAL_COMMUNITY)
Admission: EM | Disposition: A | Discharge status: Home / Self Care | Payer: Self-pay | Source: Home / Self Care | Attending: Cardiology

## 2014-07-22 LAB — BASIC METABOLIC PANEL
Anion gap: 8 (ref 5–15)
BUN: 11 mg/dL (ref 6–23)
CHLORIDE: 108 mmol/L (ref 96–112)
CO2: 22 mmol/L (ref 19–32)
CREATININE: 0.7 mg/dL (ref 0.50–1.35)
Calcium: 9.6 mg/dL (ref 8.4–10.5)
GFR calc Af Amer: >90 mL/min (ref >90)
GFR calc non Af Amer: >90 mL/min (ref >90)
GLUCOSE: 106 mg/dL — AB (ref 70–99)
Potassium: 4.1 mmol/L (ref 3.5–5.1)
Sodium: 138 mmol/L (ref 135–145)

## 2014-07-22 LAB — LIPID PANEL
CHOL/HDL RATIO: 2.8 ratio
Cholesterol: 141 mg/dL (ref 0–200)
HDL: 50 mg/dL (ref >39)
LDL Cholesterol: 79 mg/dL (ref 0–99)
Triglycerides: 61 mg/dL (ref <150)
VLDL: 12 mg/dL (ref 0–40)

## 2014-07-22 LAB — CBC
HCT: 42 % (ref 39.0–52.0)
Hemoglobin: 13.3 g/dL (ref 13.0–17.0)
MCH: 25.4 pg — AB (ref 26.0–34.0)
MCHC: 31.7 g/dL (ref 30.0–36.0)
MCV: 80.3 fL (ref 78.0–100.0)
Platelets: 160 10*3/uL (ref 150–400)
RBC: 5.23 MIL/uL (ref 4.22–5.81)
RDW: 15.5 % (ref 11.5–15.5)
WBC: 8.2 10*3/uL (ref 4.0–10.5)

## 2014-07-22 LAB — TROPONIN I: Troponin I: 0.07 ng/mL — ABNORMAL HIGH (ref <0.031)

## 2014-07-22 LAB — HEPARIN LEVEL (UNFRACTIONATED): Heparin Unfractionated: 0.48 IU/mL (ref 0.30–0.70)

## 2014-07-22 SURGERY — LEFT HEART CATHETERIZATION WITH CORONARY ANGIOGRAM

## 2014-07-22 MED ORDER — ASPIRIN 81 MG PO CHEW: 81.0000 mg | CHEWABLE_TABLET | ORAL | Status: DC

## 2014-07-22 MED ORDER — NITROGLYCERIN 0.4 MG SL SUBL: 0.4000 mg | SUBLINGUAL_TABLET | SUBLINGUAL | Status: DC | PRN

## 2014-07-22 MED ORDER — METOPROLOL SUCCINATE ER 25 MG PO TB24: 25.0000 mg | ORAL_TABLET | Freq: Every day | ORAL | Status: DC

## 2014-07-22 MED ORDER — RIVAROXABAN 20 MG PO TABS: 20.0000 mg | ORAL_TABLET | Freq: Every day | ORAL | Status: DC

## 2014-07-22 MED ORDER — AMIODARONE HCL 200 MG PO TABS: 200.0000 mg | ORAL_TABLET | Freq: Two times a day (BID) | ORAL | Status: DC

## 2014-07-22 MED ORDER — AMIODARONE HCL 200 MG PO TABS
200.0000 mg | ORAL_TABLET | Freq: Two times a day (BID) | ORAL | Status: DC
Start: 2014-07-22 — End: 2014-08-03

## 2014-07-22 NOTE — Progress Notes (Signed)
Pt states that he is "having a panic attack." Pt states that he suffers from PTSD and can no longer take being confide to his room. He is requesting to leave AMA. Dr. Terrence Dupont was paged and is at bedside. Plan is to allow pt to leave AMA and pt states he will follow up with Dr. Terrence Dupont in his office. IVs removed and IV medicine stopped. Pt was given prescription for the meds he has been getting in the hospital.

## 2014-07-22 NOTE — Discharge Summary (Signed)
Discharge summary on his medical advice dictated on 07/22/2014 dictation number is [phone]

## 2014-07-22 NOTE — Discharge Instructions (Signed)
Acute Coronary Syndrome Acute coronary syndrome (ACS) is an urgent problem in which the blood and oxygen supply to the heart is critically deficient. ACS requires hospitalization because one or more coronary arteries may be blocked. ACS represents a range of conditions including:  Previous angina that is now unstable, lasts longer, happens at rest, or is more intense.  A heart attack, with heart muscle cell injury and death. There are three vital coronary arteries that supply the heart muscle with blood and oxygen so that it can pump blood effectively. If blockages to these arteries develop, blood flow to the heart muscle is reduced. If the heart does not get enough blood, angina may occur as the first warning sign. SYMPTOMS   The most common signs of angina include:  Tightness or squeezing in the chest.  Feeling of heaviness on the chest.  Discomfort in the arms, neck, back, or jaw.  Shortness of breath and nausea.  Cold, wet skin.  Angina is usually brought on by physical effort or excitement which increase the oxygen needs of the heart. These states increase the blood flow needs of the heart beyond what can be delivered.  Other symptoms that are not as common include:  Fatigue  Unexplained feelings of nervousness or anxiety  Weakness  Diarrhea  Sometimes, you may not have noticed any symptoms at all but still suffered a cardiac injury. TREATMENT   Medicines to help discomfort may include nitroglycerin (nitro) in the form of tablets or a spray for rapid relief, or longer-acting forms such as cream, patches, or capsules. (Be aware that there are many side effects and possible interactions with other drugs).  Other medicines may be used to help the heart pump better.  Procedures to open blocked arteries including angioplasty or stent placement to keep the arteries open.  Open heart surgery may be needed when there are many blockages or they are in critical locations that  are best treated with surgery. HOME CARE INSTRUCTIONS   Do not use any tobacco products including cigarettes, chewing tobacco, or electronic cigarettes.  Take one baby or adult aspirin daily, if your health care provider advises. This helps reduce the risk of a heart attack.  It is very important that you follow the angina treatment prescribed by your health care provider. Make arrangements for proper follow-up care.  Eat a heart healthy diet with salt and fat restrictions as advised.  Regular exercise is good for you as long as it does not cause discomfort. Do not begin any new type of exercise until you check with your health care provider.  If you are overweight, you should lose weight.  Try to maintain normal blood lipid levels.  Keep your blood pressure under control as recommended by your health care provider.  You should tell your health care provider right away about any increase in the severity or frequency of your chest discomfort or angina attacks. When you have angina, you should stop what you are doing and sit down. This may bring relief in 3 to 5 minutes. If your health care provider has prescribed nitro, take it as directed.  If your health care provider has given you a follow-up appointment, it is very important to keep that appointment. Not keeping the appointment could result in a chronic or permanent injury, pain, and disability. If there is any problem keeping the appointment, you must call back to this facility for assistance. SEEK IMMEDIATE MEDICAL CARE IF:   You develop nausea, vomiting, or shortness  of breath.  You feel faint, lightheaded, or pass out.  Your chest discomfort gets worse.  You are sweating or experience sudden profound fatigue.  You do not get relief of your chest pain after 3 doses of nitro.  Your discomfort lasts longer than 15 minutes. MAKE SURE YOU:   Understand these instructions.  Will watch your condition.  Will get help right  away if you are not doing well or get worse.  Take all medicines as directed by your health care provider. Document Released: 04/23/2005 Document Revised: 04/28/2013 Document Reviewed: 08/25/2013 [facility] Patient Information 2015 [place], [place]. This information is not intended to replace advice given to you by your health care provider. Make sure you discuss any questions you have with your health care provider. Atrial Fibrillation Atrial fibrillation is a type of irregular heart rhythm (arrhythmia). During atrial fibrillation, the upper chambers of the heart (atria) quiver continuously in a chaotic pattern. This causes an irregular and often rapid heart rate.  Atrial fibrillation is the result of the heart becoming overloaded with disorganized signals that tell it to beat. These signals are normally released one at a time by a part of the right atrium called the sinoatrial node. They then travel from the atria to the lower chambers of the heart (ventricles), causing the atria and ventricles to contract and pump blood as they pass. In atrial fibrillation, parts of the atria outside of the sinoatrial node also release these signals. This results in two problems. First, the atria receive so many signals that they do not have time to fully contract. Second, the ventricles, which can only receive one signal at a time, beat irregularly and out of rhythm with the atria.  There are three types of atrial fibrillation:   Paroxysmal. Paroxysmal atrial fibrillation starts suddenly and stops on its own within a week.  Persistent. Persistent atrial fibrillation lasts for more than a week. It may stop on its own or with treatment.  Permanent. Permanent atrial fibrillation does not go away. Episodes of atrial fibrillation may lead to permanent atrial fibrillation. Atrial fibrillation can prevent your heart from pumping blood normally. It increases your risk of stroke and can lead to heart failure.  CAUSES    Heart conditions, including a heart attack, heart failure, coronary artery disease, and heart valve conditions.   Inflammation of the sac that surrounds the heart (pericarditis).  Blockage of an artery in the lungs (pulmonary embolism).  Pneumonia or other infections.  Chronic lung disease.  Thyroid problems, especially if the thyroid is overactive (hyperthyroidism).  Caffeine, excessive alcohol use, and use of some illegal drugs.   Use of some medicines, including certain decongestants and diet pills.  Heart surgery.   Birth defects.  Sometimes, no cause can be found. When this happens, the atrial fibrillation is called lone atrial fibrillation. The risk of complications from atrial fibrillation increases if you have lone atrial fibrillation and you are age 64 years or older. RISK FACTORS  Heart failure.  Coronary artery disease.  Diabetes mellitus.   High blood pressure (hypertension).   Obesity.   Other arrhythmias.   Increased age. SIGNS AND SYMPTOMS   A feeling that your heart is beating rapidly or irregularly.   A feeling of discomfort or pain in your chest.   Shortness of breath.   Sudden light-headedness or weakness.   Getting tired easily when exercising.   Urinating more often than normal (mainly when atrial fibrillation first begins).  In paroxysmal atrial fibrillation, symptoms  may start and suddenly stop. DIAGNOSIS  Your health care provider may be able to detect atrial fibrillation when taking your pulse. Your health care provider may have you take a test called an ambulatory electrocardiogram (ECG). An ECG records your heartbeat patterns over a 24-hour period. You may also have other tests, such as:  Transthoracic echocardiogram (TTE). During echocardiography, sound waves are used to evaluate how blood flows through your heart.  Transesophageal echocardiogram (TEE).  Stress test. There is more than one type of stress test. If a  stress test is needed, ask your health care provider about which type is best for you.  Chest X-ray exam.  Blood tests.  Computed tomography (CT). TREATMENT  Treatment may include:  Treating any underlying conditions. For example, if you have an overactive thyroid, treating the condition may correct atrial fibrillation.  Taking medicine. Medicines may be given to control a rapid heart rate or to prevent blood clots, heart failure, or a stroke.  Having a procedure to correct the rhythm of the heart:  Electrical cardioversion. During electrical cardioversion, a controlled, low-energy shock is delivered to the heart through your skin. If you have chest pain, very low blood pressure, or sudden heart failure, this procedure may need to be done as an emergency.  Catheter ablation. During this procedure, heart tissues that send the signals that cause atrial fibrillation are destroyed.  Surgical ablation. During this surgery, thin lines of heart tissue that carry the abnormal signals are destroyed. This procedure can either be an open-heart surgery or a minimally invasive surgery. With the minimally invasive surgery, small cuts are made to access the heart instead of a large opening.  Pulmonary venous isolation. During this surgery, tissue around the veins that carry blood from the lungs (pulmonary veins) is destroyed. This tissue is thought to carry the abnormal signals. HOME CARE INSTRUCTIONS   Take medicines only as directed by your health care provider. Some medicines can make atrial fibrillation worse or recur.  If blood thinners were prescribed by your health care provider, take them exactly as directed. Too much blood-thinning medicine can cause bleeding. If you take too little, you will not have the needed protection against stroke and other problems.  Perform blood tests at home if directed by your health care provider. Perform blood tests exactly as directed.  Quit smoking if you  smoke.  Do not drink alcohol.  Do not drink caffeinated beverages such as coffee, soda, and some teas. You may drink decaffeinated coffee, soda, or tea.   Maintain a healthy weight.Do not use diet pills unless your health care provider approves. They may make heart problems worse.   Follow diet instructions as directed by your health care provider.  Exercise regularly as directed by your health care provider.  Keep all follow-up visits as directed by your health care provider. This is important. PREVENTION  The following substances can cause atrial fibrillation to recur:   Caffeinated beverages.  Alcohol.  Certain medicines, especially those used for breathing problems.  Certain herbs and herbal medicines, such as those containing ephedra or ginseng.  Illegal drugs, such as cocaine and amphetamines. Sometimes medicines are given to prevent atrial fibrillation from recurring. Proper treatment of any underlying condition is also important in helping prevent recurrence.  SEEK MEDICAL CARE IF:  You notice a change in the rate, rhythm, or strength of your heartbeat.  You suddenly begin urinating more frequently.  You tire more easily when exerting yourself or exercising. [place]  CARE IF:   You have chest pain, abdominal pain, sweating, or weakness.  You feel nauseous.  You have shortness of breath.  You suddenly have swollen feet and ankles.  You feel dizzy.  Your face or limbs feel numb or weak.  You have a change in your vision or speech. MAKE SURE YOU:   Understand these instructions.  Will watch your condition.  Will get help right away if you are not doing well or get worse. Document Released: 04/23/2005 Document Revised: 09/07/2013 Document Reviewed: 06/03/2012 [facility] Patient Information 2015 [place], [place]. This information is not intended to replace advice given to you by your health care provider. Make sure you discuss any questions you  have with your health care provider.

## 2014-07-23 NOTE — Discharge Summary (Signed)
[name], [name]              ACCOUNT NO.:  [id number]  MEDICAL RECORD NO.:  [id number]  LOCATION:  2H10C                        FACILITY:  [facility]  PHYSICIAN:  Kelsei Defino N. Terrence Dupont, M.D. DATE OF BIRTH:  [birth year]  DATE OF ADMISSION:  07/20/2014 DATE OF DISCHARGE:  07/22/2014                              DISCHARGE SUMMARY   ADMITTING DIAGNOSES: 1. Status post atrial fibrillation with moderate ventricular response     converted to sinus rhythm, minimally elevated troponin-I probably     secondary to demand ischemia status post atypical chest pain with     minimally elevated troponin-I, also rule out coronary     insufficiency. 2. 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 3. Degenerative joint disease. 4. History of tobacco abuse. 5. Posttraumatic stress disorder. 6. Elevated blood sugar, rule out diabetes mellitus.  FINAL DIAGNOSES: 1. Status post atrial fibrillation with moderate ventricular response,     converted to sinus rhythm, remains in sinus rhythm.  Minimally     elevated troponin-I, probably secondary to demand ischemia, rule     out coronary insufficiency. 2. Status post atypical chest pain. 3. Coronary artery disease.  Probable old silent myocardial     infarctions in the past with abnormal EKGs. 4. Degenerative joint disease. 5. History of tobacco abuse. 6. Posttraumatic stress disorder. 7. Prediabetic.  DISCHARGE HOME MEDICATIONS: 1. Amiodarone 200 mg 1 tablet twice daily. 2. Aspirin 81 mg 1 tablet daily. 3. Metoprolol succinate 25 mg 1 tablet daily. 4. Nitrostat 0.4 mg sublingual use as directed. 5. Vitamin D 1 tablet daily. 6. Xarelto 20 mg 1 tablet daily.  DIET:  Low salt, low cholesterol 1800 calories ADA diet.  The patient will sign out against medical advice.  The patient is advised to call 911 if he gets recurrent chest pain, palpitations, which he understands.  CONDITION AT DISCHARGE:   Stable.  FOLLOWUP:  Follow up with me in 1 week.  We will schedule for nuclear stress test as an outpatient.  BRIEF HISTORY AND HOSPITAL COURSE:  [name] is a 69 year old male with past medical history significant for degenerative joint disease, history of tobacco abuse, PTSD, probable silent MIs in the past was transferred from [facility] for further evaluation and treatment.  The patient complained of shortness of breath associated with feeling weak, dizzy, clammy, and tired easily for the last few days while walking in the parking lot.  The patient states normally he is very active and plays tennis without any problems, but lately also gets vague back pain radiating to the chest and neck and occasionally numbness in the hands.  The patient denies any palpitation, lightheadedness, or syncope.  Denies PND, orthopnea, or leg swelling. EKG done in [facility] showed AFib with moderate ventricular response, possible old anteroseptal, and inferior wall MI, age undetermined.  The patient was also noted to have minimally elevated troponin-I of 0.05.  The patient spontaneously converted back into sinus rhythm, received a bolus of amiodarone at [facility], and was started on heparin drip and transferred to [facility].  PHYSICAL EXAMINATION:  VITAL SIGNS:  His  blood pressure was 115/61, pulse 53, he was afebrile. EYES:  Conjunctivae were pink. NECK:  Supple.  No JVD.  No bruit. LUNGS:  Clear to auscultation without rhonchi or rales. CARDIOVASCULAR:  S1, S2 was normal.  There was soft systolic murmur.  No S3, gallop. ABDOMEN:  Soft.  Bowel sounds were present.  Nontender. EXTREMITIES:  There is no clubbing, cyanosis, or edema.  LABORATORY DATA:  Sodium was 137, potassium 4.3, BUN 28, creatinine 1.33.  His blood sugar was 139, repeat fasting blood sugar is 106. Today's labs; sodium 138, potassium 4.1, BUN 11, creatinine 0.70.  His troponin-I  first set was 0.05.  Next set was 0.10.  The next 3 sets were 0.07.  His cholesterol was 141, triglycerides 61, HDL 50, LDL was 79. Hemoglobin was 14.9, hematocrit 46, white count of 12.6, repeat hemoglobin was 13.3, hematocrit 41.2, white count of 7.9.  The patient had CT angio done, which showed normal caliber of thoracic and abdominal aorta, no evidence of dissection, fibrotic appearance, interstitial coarsening of both lungs, mild bronchiectasis was noted.  Severe degenerative disk disease with facet changes in the lumbar spine.  His EKG showed normal sinus rhythm with old anteroseptal and inferior wall MI, age undetermined.  2D echo showed good LV systolic function with EF of 50% to 55%.  No apparent significant wall motion abnormalities were noted.  BRIEF HOSPITAL COURSE:  The patient was admitted to step-down unit.  The patient did not have any further episodes of chest pain during the hospital stay.  Discussed with the patient at length various options of treatment, i.e., noninvasive stress testing versus cardiac catheterization, its risks and benefits, initially the patient consented for cardiac catheterization, but then he refused to proceed with cardiac catheterization and wanted to be treated noninvasively first by stress test, which was scheduled for today, but the patient apparently is very anxious and eager to go home.  States he wanted to sign out AMA despite multiple requests and states he will follow up as an outpatient and arrange for the stress test as outpatient.  The patient will be discharged home on above medications and will be followed up in my office in 1 week.  We will arrange for stress test as an outpatient.     Allegra Lai. Terrence Dupont, M.D.    MNH/MEDQ  D:  07/22/2014  T:  07/23/2014  Job:  [id number]  cc:   Allegra Lai. Terrence Dupont, M.D.

## 2014-08-02 ENCOUNTER — Encounter: Payer: Self-pay | Admitting: Cardiology

## 2014-08-02 DIAGNOSIS — I4819 Other persistent atrial fibrillation: Secondary | ICD-10-CM | POA: Insufficient documentation

## 2014-08-02 DIAGNOSIS — R9431 Abnormal electrocardiogram [ECG] [EKG]: Secondary | ICD-10-CM | POA: Insufficient documentation

## 2014-08-02 DIAGNOSIS — I480 Paroxysmal atrial fibrillation: Secondary | ICD-10-CM

## 2014-08-02 HISTORY — DX: Paroxysmal atrial fibrillation: I48.0

## 2014-08-02 NOTE — Progress Notes (Signed)
Cardiology Office Note   Date:  08/03/2014   ID:  [name], DOB 02-Apr-1946, MRN [id number]  PCP:  Bobby Peaches, MD  Cardiologist:   Sueanne Margarita, MD   Chief Complaint  Patient presents with  . Chest Pain  . Atrial Fibrillation      History of Present Illness: [name] is a 69 y.o. male who presents for evaluation of CAD.  He was admitted to [facility] with weakness, SOB and dizziness and was found to be in afib with RVR and converted to NSR.  He had a minimal bump in his troponin felt secondary to demand ischemia.  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 EKG done in 2013 looked the same and nuclear stress test at that time for cardiac clearance for knee surgery was normal.  He was seen by cardiology and based on elevated trop Cardiac cath was recommended but patient refused but wanted to pursue a noninvasive workup with stress testing but then patient decided he wanted to go home and signed out AMA.  He now presents for further evaluation. He is very active and plays tennis twice weekly year round and softball and never has any chest pain or SOB.  He has not had any further episodes of weakness, SOB or dizziness since his bout of afib.        Past Medical History  Diagnosis Date  . Arthritis   . PTSD (post-traumatic stress disorder)     controlled  . PAF (paroxysmal atrial fibrillation) 08/02/2014    Past Surgical History  Procedure Laterality Date  . Back surgery      2000  . Shoulder arthroscopy w/ rotator cuff repair  2012    right  . Joint replacement       left 2005  . 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 aspirin 81 MG chewable tablet Chew 1 tablet (81 mg  total) by mouth before cath procedure. 30 tablet 3  . cholecalciferol (VITAMIN D) 1000 UNITS tablet Take 1,000 Units by mouth daily.    . metoprolol succinate (TOPROL XL) 25 MG 24 hr tablet Take 1 tablet (25 mg total) by mouth daily. 30 tablet 3  . nitroGLYCERIN (NITROSTAT) 0.4 MG SL tablet Place 1 tablet (0.4 mg total) under the tongue every 5 (five) minutes as needed for chest pain. 25 tablet 12   No current facility-administered medications for this visit.    Allergies:   Review of patient's allergies indicates no known allergies.    Social History:  The patient  reports that he has been smoking Cigarettes.  He has a 10 pack-year smoking history. He has never used smokeless tobacco. He reports that he does not drink alcohol or use illicit drugs.   Family History:  The patient's family history is negative for Anesthesia problems, Hypotension, Malignant hyperthermia, and Pseudochol deficiency.    ROS:  Please see the history of present illness.   Otherwise, review of systems are positive for none.   All other systems are reviewed and negative.    PHYSICAL EXAM: VS:  BP 122/60 mmHg  Pulse 60  Ht 6\' 2"  (1.88 m)  Wt 227 lb 9.6 oz (  103.239 kg)  BMI 29.21 kg/m2  SpO2 95% , BMI Body mass index is 29.21 kg/(m^2). GEN: Well nourished, well developed, in no acute distress HEENT: normal Neck: no JVD, carotid bruits, or masses Cardiac: RRR; no murmurs, rubs, or gallops,no edema  Respiratory:  clear to auscultation bilaterally, normal work of breathing GI: soft, nontender, nondistended, + BS MS: no deformity or atrophy Skin: warm and dry, no rash Neuro:  Strength and sensation are intact Psych: euthymic mood, full affect   EKG:  EKG is not ordered today.   Recent Labs: 07/20/2014: B Natriuretic Peptide 93.7 07/21/2014: ALT 24; Magnesium 1.9; TSH 1.479 07/22/2014: BUN 11; Creatinine 0.70; Hemoglobin 13.3; Platelets 160; Potassium 4.1; Sodium 138    Lipid Panel    Component Value  Date/Time   CHOL 141 07/22/2014 0423   TRIG 61 07/22/2014 0423   HDL 50 07/22/2014 0423   CHOLHDL 2.8 07/22/2014 0423   VLDL 12 07/22/2014 0423   LDLCALC 79 07/22/2014 0423      Wt Readings from Last 3 Encounters:  08/03/14 227 lb 9.6 oz (103.239 kg)  07/21/14 225 lb 1.4 oz (102.1 kg)  05/23/13 220 lb (99.791 kg)         ASSESSMENT AND PLAN:  1.  PAF with RVR maintaining NSR.  His CHADS2VASC score is 1 so will stop Amio and continue Toprol.  He will stop Xarelto on April 16th which will be 4 weeks out from going back into NSR.   2.  Chest pain with abnormal EKG showing possible old infarct but unchanged from prior EKG in 2013 when stress test was normal.  Trop elevated in hospital felt secondary to AFib with RVR with demand ischemia but due to EKG abnormality cath was recommended and patient refused.  Will get Lexiscan myoview to evaluate further.   3.  Abnormal EKG   Current medicines are reviewed at length with the patient today.  The patient does not have concerns regarding medicines.  The following changes have been made:  no change  Labs/ tests ordered today include: Lexiscan myoview   Orders Placed This Encounter  Procedures  . Myocardial Perfusion Imaging     Disposition:   FU with me in 6 months   Signed, Sueanne Margarita, MD  08/03/2014 9:54 AM    [facility] [place], [place], [place]  [address] Phone: [phone]; Fax: [phone]

## 2014-08-03 ENCOUNTER — Ambulatory Visit (INDEPENDENT_AMBULATORY_CARE_PROVIDER_SITE_OTHER): Payer: Medicare Other | Admitting: Cardiology

## 2014-08-03 ENCOUNTER — Encounter: Payer: Self-pay | Admitting: Cardiology

## 2014-08-03 VITALS — BP 122/60 | HR 60 | Ht 74.0 in | Wt 227.6 lb

## 2014-08-03 DIAGNOSIS — R079 Chest pain, unspecified: Secondary | ICD-10-CM | POA: Diagnosis not present

## 2014-08-03 DIAGNOSIS — I480 Paroxysmal atrial fibrillation: Secondary | ICD-10-CM | POA: Diagnosis not present

## 2014-08-03 DIAGNOSIS — I249 Acute ischemic heart disease, unspecified: Secondary | ICD-10-CM

## 2014-08-03 DIAGNOSIS — R9431 Abnormal electrocardiogram [ECG] [EKG]: Secondary | ICD-10-CM | POA: Diagnosis not present

## 2014-08-03 NOTE — Patient Instructions (Signed)
Your physician has recommended you make the following change in your medication:  1) STOP AMIODARONE 2) STOP XARELTO on 08/21/14  Dr. Radford Pax recommends you have a NUCLEAR STRESS TEST.  Your physician wants you to follow-up in: 6 months with Dr. Radford Pax. You will receive a reminder letter in the mail two months in advance. If you don't receive a letter, please call our office to schedule the follow-up appointment.

## 2014-08-11 ENCOUNTER — Encounter (HOSPITAL_COMMUNITY): Payer: BLUE CROSS/BLUE SHIELD

## 2014-08-11 ENCOUNTER — Encounter (HOSPITAL_COMMUNITY): Payer: Self-pay

## 2014-08-11 NOTE — Progress Notes (Signed)
Patient ID: [name], male   DOB: Jun 08, 1945, 69 y.o.   MRN: [id number] Patient called with instructions for Nuclear Exercise Stress test tomorrow. Patient thought he was to have an exercise cardiolite, and thought his Toprol was discontinued at the  last office visit with Dr. Fransico Him. I spoke with Dr.Turner who wants patient to restart Toprol 25 mg daily starting in am, and to do Lexiscan cardiolite due to recent admission with AFIB/RVR. Irven Baltimore, RN.

## 2014-08-12 ENCOUNTER — Ambulatory Visit (HOSPITAL_COMMUNITY): Payer: Medicare Other | Attending: Cardiology | Admitting: Radiology

## 2014-08-12 DIAGNOSIS — R079 Chest pain, unspecified: Secondary | ICD-10-CM | POA: Insufficient documentation

## 2014-08-12 DIAGNOSIS — R9431 Abnormal electrocardiogram [ECG] [EKG]: Secondary | ICD-10-CM | POA: Insufficient documentation

## 2014-08-12 MED ORDER — REGADENOSON 0.4 MG/5ML IV SOLN
0.4000 mg | Freq: Once | INTRAVENOUS | Status: AC
  Administered 2014-08-12: 0.4 mg via INTRAVENOUS

## 2014-08-12 MED ORDER — TECHNETIUM TC 99M SESTAMIBI GENERIC - CARDIOLITE
33.0000 | Freq: Once | INTRAVENOUS | Status: AC | PRN
  Administered 2014-08-12: 33 via INTRAVENOUS

## 2014-08-12 MED ORDER — TECHNETIUM TC 99M SESTAMIBI GENERIC - CARDIOLITE
11.0000 | Freq: Once | INTRAVENOUS | Status: AC | PRN
  Administered 2014-08-12: 11 via INTRAVENOUS

## 2014-08-12 NOTE — Progress Notes (Signed)
[facility] [address] [place], [place] [address] [phone]    Cardiology Nuclear Med Study  [name] is a 69 y.o. male     MRN : [id number]     DOB: 1945-09-14  Procedure Date: 08/12/2014  Nuclear Med Background Indication for Stress Test:  Evaluation for Ischemia, and Patient seen in hospital on 07-23-2014 for AFIB/RVR, Increase Troponin due to demand ischemia  History:  MPI 2013 (normal) EF 65%, Afib  Cardiac Risk Factors: Smoker  Symptoms:  Chest Pain and SOB   Nuclear Pre-Procedure Caffeine/Decaff Intake:  None> 12 hrs NPO After: 9:30pm   Lungs:  clear O2 Sat: 96% on room air. IV 0.9% NS with Angio Cath:  22g  IV Site: R Hand x 1, tolerated well IV Started by:  Irven [place], RN  Chest Size (in):  46 Cup Size: n/a  Height: 6\' 2"  (1.88 m)  Weight:  224 lb (101.606 kg)  BMI:  Body mass index is 28.75 kg/(m^2). Tech Comments:  Patient took Toprol this am on arrival at 59. Irven [place], RN    Nuclear Med Study 1 or 2 day study: 1 day  Stress Test Type:  Carlton Adam  Reading MD: N/A  Order Authorizing Provider:  Fransico Him, MD  Resting Radionuclide: Technetium 27m Sestamibi  Resting Radionuclide Dose: 11.0 mCi   Stress Radionuclide:  Technetium 28m Sestamibi  Stress Radionuclide Dose: 33.0 mCi           Stress Protocol Rest HR: 50 Stress HR: 68  Rest BP: 134/73 Stress BP: 118/76  Exercise Time (min): n/a METS: n/a   Predicted Max HR: 152 bpm % Max HR: 44.74 bpm Rate Pressure Product: 8704   Dose of Adenosine (mg):  n/a Dose of Lexiscan: 0.4 mg  Dose of Atropine (mg): n/a Dose of Dobutamine: n/a mcg/kg/min (at max HR)  Stress Test Technologist: Glade Lloyd, BS-ES  Nuclear Technologist:  Earl Many, CNMT     Rest Procedure:  Myocardial perfusion imaging was performed at rest 45 minutes following the intravenous administration of Technetium 76m Sestamibi. Rest ECG: Sinus bradycardia with poor R-wave progression,  nonspecific ST-T wave changes  Stress Procedure:  The patient received IV Lexiscan 0.4 mg over 15-seconds.  Technetium 28m Sestamibi injected at 30-seconds.  Quantitative spect images were obtained after a 45 minute delay.  During the infusion of Lexiscan the patient complained of SOB that resolved in recovery.  Stress ECG: No significant change from baseline ECG  QPS Raw Data Images:  Normal; no motion artifact; normal heart/lung ratio. Stress Images:  There is mild decreased uptake along the inferior wall distribution seen at both rest and stress compatible with diaphragmatic attenuation. Otherwise, homogeneous radiotracer uptake. There is no evidence of ischemia. Rest Images:  As above Subtraction (SDS):  No evidence of ischemia. Transient Ischemic Dilatation (Normal <1.22):  0.99 Lung/Heart Ratio (Normal <0.45):  0.27  Quantitative Gated Spect Images QGS EDV:  124 ml QGS ESV:  48 ml  Impression Exercise Capacity:  Lexiscan with no exercise. BP Response:  Normal blood pressure response. Clinical Symptoms:  Typical symptoms with Lexiscan ECG Impression:  No significant ST segment change suggestive of ischemia. Comparison with Prior Nuclear Study: No images to compare  Overall Impression:  Low risk stress nuclear study with no evidence of ischemia identified..  LV Ejection Fraction: 61%.  LV Wall Motion:  NL LV Function; NL Wall Motion  Candee Furbish, MD

## 2014-11-11 ENCOUNTER — Encounter: Payer: Self-pay | Admitting: Cardiology

## 2014-11-25 DIAGNOSIS — D485 Neoplasm of uncertain behavior of skin: Secondary | ICD-10-CM | POA: Diagnosis not present

## 2014-11-25 DIAGNOSIS — L720 Epidermal cyst: Secondary | ICD-10-CM | POA: Diagnosis not present

## 2014-11-25 DIAGNOSIS — D225 Melanocytic nevi of trunk: Secondary | ICD-10-CM | POA: Diagnosis not present

## 2015-01-31 ENCOUNTER — Ambulatory Visit (INDEPENDENT_AMBULATORY_CARE_PROVIDER_SITE_OTHER): Payer: Medicare Other | Admitting: Cardiology

## 2015-01-31 ENCOUNTER — Encounter: Payer: Self-pay | Admitting: Cardiology

## 2015-01-31 VITALS — BP 118/64 | HR 78 | Ht 74.0 in | Wt 223.0 lb

## 2015-01-31 DIAGNOSIS — J209 Acute bronchitis, unspecified: Secondary | ICD-10-CM

## 2015-01-31 DIAGNOSIS — R9431 Abnormal electrocardiogram [ECG] [EKG]: Secondary | ICD-10-CM

## 2015-01-31 DIAGNOSIS — I249 Acute ischemic heart disease, unspecified: Secondary | ICD-10-CM | POA: Diagnosis not present

## 2015-01-31 DIAGNOSIS — R05 Cough: Secondary | ICD-10-CM

## 2015-01-31 DIAGNOSIS — I480 Paroxysmal atrial fibrillation: Secondary | ICD-10-CM | POA: Diagnosis not present

## 2015-01-31 DIAGNOSIS — R079 Chest pain, unspecified: Secondary | ICD-10-CM | POA: Diagnosis not present

## 2015-01-31 DIAGNOSIS — R059 Cough, unspecified: Secondary | ICD-10-CM

## 2015-01-31 MED ORDER — AZITHROMYCIN 250 MG PO TABS: ORAL_TABLET | ORAL | Status: DC

## 2015-01-31 NOTE — Addendum Note (Signed)
Addended by: Harland German A on: 01/31/2015 04:03 PM   Modules accepted: Orders

## 2015-01-31 NOTE — Patient Instructions (Signed)
Medication Instructions:  Your physician has recommended you make the following change in your medication:  1) TAKE YOUR Z-PACK AS DIRECTED  Labwork: None  Testing/Procedures: Dr. Radford Pax recommends you have a CHEST XRAY.   Follow-Up: Your physician wants you to follow-up in: 6 months with Dr. Radford Pax. You will receive a reminder letter in the mail two months in advance. If you don't receive a letter, please call our office to schedule the follow-up appointment.   Any Other Special Instructions Will Be Listed Below (If Applicable). Call your PCP if cough does not resolve with antibiotics or if you develop a fever.

## 2015-01-31 NOTE — Progress Notes (Signed)
Cardiology Office Note   Date:  01/31/2015   ID:  [name], DOB 1945/11/01, MRN [id number]  PCP:  Criselda Peaches, MD    Chief Complaint  Patient presents with  . Atrial Fibrillation      History of Present Illness: [name] is a 69 y.o. male who presents for followup of PAF. He was admitted to [facility] with weakness, SOB and dizziness and was found to be in afib with RVR and converted to NSR. He had a minimal bump in his troponin felt secondary to demand ischemia. 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 EKG done in 2013 looked the same and nuclear stress test at that time for cardiac clearance for knee surgery was normal. He was seen by cardiology and based on elevated trop Cardiac cath was recommended but patient refused but wanted to pursue a noninvasive workup with stress testing but then patient decided he wanted to go home and signed out AMA. He subsequently underwent nuclear stress test as an outp that showed no ischemia and normal LVF.   He has not had any further episodes of weakness, palpitations, SOB or dizziness since his bout of afib.  He recently devloped a cough productive of discolored sputum.  He denies any fever or chills.    Past Medical History  Diagnosis Date  . Arthritis   . PTSD (post-traumatic stress disorder)     controlled  . PAF (paroxysmal atrial fibrillation) 08/02/2014    Past Surgical History  Procedure Laterality Date  . Back surgery      2000  . Shoulder arthroscopy w/ rotator cuff repair  2012    right  . Joint replacement       left 2005  . Total knee arthroplasty  08/28/2011    Procedure: TOTAL KNEE ARTHROPLASTY;  Surgeon: Garald Balding, MD;  Location: [facility];  Service: Orthopedics;  Laterality: Right;     Current Outpatient Prescriptions  Medication Sig Dispense Refill  . Apixaban (ELIQUIS PO) Take by mouth 2  (two) times daily.    . cholecalciferol (VITAMIN D) 1000 UNITS tablet Take 1,000 Units by mouth daily.    . nitroGLYCERIN (NITROSTAT) 0.4 MG SL tablet Place 1 tablet (0.4 mg total) under the tongue every 5 (five) minutes as needed for chest pain. 25 tablet 12   No current facility-administered medications for this visit.    Allergies:   Review of patient's allergies indicates no known allergies.    Social History:  The patient  reports that he has been smoking Cigarettes.  He has a 10 pack-year smoking history. He has never used smokeless tobacco. He reports that he does not drink alcohol or use illicit drugs.   Family History:  The patient's family history is negative for Anesthesia problems, Hypotension, Malignant hyperthermia, and Pseudochol deficiency.    ROS:  Please see the history of present illness.   Otherwise, review of systems are positive for none.   All other systems are reviewed and negative.    PHYSICAL EXAM: VS:  BP 118/64 mmHg  Pulse 78  Ht '6\' 2"'$  (1.88 m)  Wt 223 lb (101.152 kg)  BMI 28.62 kg/m2 , BMI Body mass index is 28.62 kg/(m^2). GEN: Well nourished, well developed, in no acute distress HEENT: normal Neck: no JVD, carotid  bruits, or masses Cardiac: RRR; no murmurs, rubs, or gallops,no edema  Respiratory:  clear to auscultation bilaterally, normal work of breathing GI: soft, nontender, nondistended, + BS MS: no deformity or atrophy Skin: warm and dry, no rash Neuro:  Strength and sensation are intact Psych: euthymic mood, full affect   EKG:  EKG is not ordered today.    Recent Labs: 07/20/2014: B Natriuretic Peptide 93.7 07/21/2014: ALT 24; Magnesium 1.9; TSH 1.479 07/22/2014: BUN 11; Creatinine, Ser 0.70; Hemoglobin 13.3; Platelets 160; Potassium 4.1; Sodium 138    Lipid Panel    Component Value Date/Time   CHOL 141 07/22/2014 0423   TRIG 61 07/22/2014 0423   HDL 50 07/22/2014 0423   CHOLHDL 2.8 07/22/2014 0423   VLDL 12 07/22/2014 0423    LDLCALC 79 07/22/2014 0423      Wt Readings from Last 3 Encounters:  01/31/15 223 lb (101.152 kg)  08/12/14 224 lb (101.606 kg)  08/03/14 227 lb 9.6 oz (103.239 kg)        ASSESSMENT AND PLAN:  1. PAF with RVR maintaining NSR. His CHADS2VASC score is 1 so no longterm anticoagulation indicated at this time.Continue Toprol.  2. Chest pain with abnormal EKG showing possible old infarct but unchanged from prior EKG in 2013 when stress test was normal. Trop elevated in hospital felt secondary to AFib with RVR with demand ischemia but due to EKG abnormality cath was recommended and patient refused.Lexiscan myoview showed no ischemia.  3. Abnormal EKG 4.  Cough productive of discolored sputum.  He has a few crackles at the bases.  Denies any fever or chills.  Will treat for acute bronchitis with a Zpack.  I instructed him to contact his PCP if he develops fever or chills or if symptoms dont resolve after antix.  I will check a CXRAY.  Current medicines are reviewed at length with the patient today.  The patient does not have concerns regarding medicines.  The following changes have been made:  no change  Labs/ tests ordered today: See above Assessment and Plan No orders of the defined types were placed in this encounter.     Disposition:   FU with me in 6 months  Signed, Sueanne Margarita, MD  01/31/2015 3:37 PM    [facility] [place], [place], [place]  [address] Phone: [phone]; Fax: [phone]

## 2015-02-01 ENCOUNTER — Ambulatory Visit (INDEPENDENT_AMBULATORY_CARE_PROVIDER_SITE_OTHER)
Admission: RE | Admit: 2015-02-01 | Discharge: 2015-02-01 | Disposition: A | Discharge status: Home / Self Care | Payer: Medicare Other | Source: Ambulatory Visit | Attending: Cardiology | Admitting: Cardiology

## 2015-02-01 ENCOUNTER — Telehealth: Payer: Self-pay | Admitting: *Deleted

## 2015-02-01 DIAGNOSIS — R05 Cough: Secondary | ICD-10-CM

## 2015-02-01 DIAGNOSIS — R059 Cough, unspecified: Secondary | ICD-10-CM

## 2015-02-01 NOTE — Telephone Encounter (Signed)
Pt notified of CXR results by phone with verbal understanding.

## 2015-06-14 DIAGNOSIS — M7541 Impingement syndrome of right shoulder: Secondary | ICD-10-CM | POA: Diagnosis not present

## 2015-08-02 NOTE — Progress Notes (Signed)
Cardiology Office Note   Date:  08/03/2015   ID:  [name], DOB 08-Mar-1946, MRN [id number]  PCP:  Bobby Peaches, MD    Chief Complaint  Patient presents with  . Atrial Fibrillation      History of Present Illness: [name] is a 70 y.o. male who presents for followup of PAF. He has had a nuclear stress test as an outp that showed no ischemia and normal LVF. He is doing well today.  He denies any chest pain, SOB, DOE, LE edema, dizziness, palpitations or syncope.    Past Medical History  Diagnosis Date  . Arthritis   . PTSD (post-traumatic stress disorder)     controlled  . PAF (paroxysmal atrial fibrillation) ([place]) 08/02/2014    Past Surgical History  Procedure Laterality Date  . Back surgery      2000  . Shoulder arthroscopy w/ rotator cuff repair  2012    right  . Joint replacement       left 2005  . 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 Apixaban (ELIQUIS PO) Take by mouth 2 (two) times daily.    . cholecalciferol (VITAMIN D) 1000 UNITS tablet Take 1,000 Units by mouth daily.    . nitroGLYCERIN (NITROSTAT) 0.4 MG SL tablet Place 1 tablet (0.4 mg total) under the tongue every 5 (five) minutes as needed for chest pain. 25 tablet 12   No current facility-administered medications for this visit.    Allergies:   Review of patient's allergies indicates no known allergies.    Social History:  The patient  reports that he has been smoking Cigarettes.  He has a 10 pack-year smoking history. He has never used smokeless tobacco. He reports that he does not drink alcohol or use illicit drugs.   Family History:  The patient's family history is negative for Anesthesia problems, Hypotension, Malignant hyperthermia, and Pseudochol  deficiency.    ROS:  Please see the history of present illness.   Otherwise, review of systems are positive for none.   All other systems are reviewed and negative.    PHYSICAL EXAM: VS:  BP 118/78 mmHg  Pulse 60  Ht '6\' 2"'$  (1.88 m)  Wt 223 lb (101.152 kg)  BMI 28.62 kg/m2 , BMI Body mass index is 28.62 kg/(m^2). GEN: Well nourished, well developed, in no acute distress HEENT: normal Neck: no JVD, carotid bruits, or masses Cardiac: RRR; no murmurs, rubs, or gallops,no edema  Respiratory:  clear to auscultation bilaterally, normal work of breathing GI: soft, nontender, nondistended, + BS MS: no deformity or atrophy Skin: warm and dry, no rash Neuro:  Strength and sensation are intact Psych: euthymic mood, full affect   EKG:  EKG was ordered today and showed NSR at 60bpm with septal infarct and no ST changes    Recent Labs: No results found for requested labs within last 365 days.    Lipid Panel    Component Value Date/Time   CHOL 141 07/22/2014 0423   TRIG 61 07/22/2014 0423   HDL 50 07/22/2014 0423   CHOLHDL 2.8 07/22/2014 0423   VLDL 12 07/22/2014 0423   LDLCALC 79 07/22/2014 0423  Wt Readings from Last 3 Encounters:  08/03/15 223 lb (101.152 kg)  01/31/15 223 lb (101.152 kg)  08/12/14 224 lb (101.606 kg)     ASSESSMENT AND PLAN:  1. PAF with RVR maintaining NSR. His CHADS2VASC score is 1 so no longterm anticoagulation indicated at this time.Continue Toprol. 2.  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  The patient does not have concerns regarding medicines.  The following changes have been made:  no change  Labs/ tests ordered today: See above Assessment and Plan No orders of the defined types were placed in this encounter.     Disposition:   FU with me in 1 year  Signed, Sueanne Margarita, MD    08/03/2015 9:51 AM    [facility] [place], [place], [place]  [address] Phone: [phone]; Fax: [phone]

## 2015-08-03 ENCOUNTER — Ambulatory Visit (INDEPENDENT_AMBULATORY_CARE_PROVIDER_SITE_OTHER): Payer: Medicare Other | Admitting: Cardiology

## 2015-08-03 ENCOUNTER — Encounter: Payer: Self-pay | Admitting: Cardiology

## 2015-08-03 VITALS — BP 118/78 | HR 60 | Ht 74.0 in | Wt 223.0 lb

## 2015-08-03 DIAGNOSIS — I480 Paroxysmal atrial fibrillation: Secondary | ICD-10-CM | POA: Diagnosis not present

## 2015-08-03 DIAGNOSIS — R9431 Abnormal electrocardiogram [ECG] [EKG]: Secondary | ICD-10-CM | POA: Diagnosis not present

## 2015-08-03 NOTE — Patient Instructions (Signed)

## 2016-07-24 ENCOUNTER — Encounter: Payer: Self-pay | Admitting: Cardiology

## 2016-08-02 ENCOUNTER — Encounter: Payer: Self-pay | Admitting: Cardiology

## 2016-08-02 ENCOUNTER — Ambulatory Visit (INDEPENDENT_AMBULATORY_CARE_PROVIDER_SITE_OTHER): Payer: Medicare Other | Admitting: Cardiology

## 2016-08-02 VITALS — BP 124/80 | HR 78 | Ht 74.0 in | Wt 223.0 lb

## 2016-08-02 DIAGNOSIS — M79606 Pain in leg, unspecified: Secondary | ICD-10-CM | POA: Diagnosis not present

## 2016-08-02 DIAGNOSIS — I481 Persistent atrial fibrillation: Secondary | ICD-10-CM

## 2016-08-02 DIAGNOSIS — R0602 Shortness of breath: Secondary | ICD-10-CM | POA: Insufficient documentation

## 2016-08-02 DIAGNOSIS — I739 Peripheral vascular disease, unspecified: Secondary | ICD-10-CM

## 2016-08-02 DIAGNOSIS — I4819 Other persistent atrial fibrillation: Secondary | ICD-10-CM

## 2016-08-02 NOTE — Patient Instructions (Addendum)
Medication Instructions:  Your physician recommends that you continue on your current medications as directed. Please refer to the Current Medication list given to you today.   Labwork: TODAY: BMET  Testing/Procedures: Your physician has requested that you have an echocardiogram. Echocardiography is a painless test that uses sound waves to create images of your heart. It provides your doctor with information about the size and shape of your heart and how well your heart's chambers and valves are working. This procedure takes approximately one hour. There are no restrictions for this procedure.   Your physician has requested that you have a lower extremity arterial duplex. During this test, ultrasound is used to evaluate arterial blood flow in the legs. Allow one hour for this exam. There are no restrictions or special instructions.   Your physician has recommended that you have a pulmonary function test. Pulmonary Function Tests are a group of tests that measure how well air moves in and out of your lungs.   Dr. Radford Pax recommends you have a CHEST CT WITH PE PROTOCOL.  Follow-Up: Your physician wants you to follow-up in: 6 months with Dr. Radford Pax. You will receive a reminder letter in the mail two months in advance. If you don't receive a letter, please call our office to schedule the follow-up appointment.   Any Other Special Instructions Will Be Listed Below (If Applicable).     If you need a refill on your cardiac medications before your next appointment, please call your pharmacy.

## 2016-08-02 NOTE — Progress Notes (Signed)
Cardiology Office Note    Date:  08/02/2016   ID:  [name], DOB 1946-03-14, MRN [id number]  PCP:  Criselda Peaches, MD  Cardiologist:  Fransico Him, MD   Chief Complaint  Patient presents with  . Atrial Fibrillation    History of Present Illness:  [name] is a 71 y.o. male who presents for followup of PAF. He has had a nuclear stress test as an outp that showed no ischemia and normal LVF. He was originally on Xarelto and I stopped this 07/2014 but then he went to the [place] [facility] and was apparently placed on Eliquis.  He then saw a different cardiologist at the [place] who thought it was ok to stop the Eliquis but he wanted to check with me.  He is doing well today.  He denies any chest pain, LE edema, dizziness, palpitations or syncope.  He has had some DOE that he thinks is new but is not sure if whether that is from exposure to agent orange.  He has noticed it when he plays tennis and also when going up stairs that he never had before.  He also is having numbness and tingling in his legs that sometimes is so unbearable because it feels like it is going to give out.  He also has had it in his hands and arms.     Past Medical History:  Diagnosis Date  . Arthritis   . PAF (paroxysmal atrial fibrillation) ([place]) 08/02/2014  . PTSD (post-traumatic stress disorder)    controlled    Past Surgical History:  Procedure Laterality Date  . BACK SURGERY     2000  . JOINT REPLACEMENT      left 2005  . SHOULDER ARTHROSCOPY W/ ROTATOR CUFF REPAIR  2012   right  . TOTAL KNEE ARTHROPLASTY  08/28/2011   Procedure: TOTAL KNEE ARTHROPLASTY;  Surgeon: Garald Balding, MD;  Location: [facility];  Service: Orthopedics;  Laterality: Right;    Current Medications: Current Meds  Medication Sig  . Apixaban (ELIQUIS PO) Take by mouth 2 (two) times daily.  . cholecalciferol (VITAMIN D) 1000 UNITS tablet Take 1,000 Units by mouth daily.  . nitroGLYCERIN (NITROSTAT) 0.4 MG SL tablet  Place 1 tablet (0.4 mg total) under the tongue every 5 (five) minutes as needed for chest pain.    Allergies:   Patient has no known allergies.   Social History   Social History  . Marital status: Married    Spouse name: N/A  . Number of children: N/A  . Years of education: N/A   Social History Main Topics  . Smoking status: Current Every Day Smoker    Packs/day: 0.50    Years: 20.00    Types: Cigarettes  . Smokeless tobacco: Never Used  . Alcohol use No  . Drug use: No  . Sexual activity: Yes     Comment: Viagra at times   Other Topics Concern  . None   Social History Narrative  . None     Family History:  The patient's family history is not on file.   ROS:   Please see the history of present illness.    Review of Systems  Musculoskeletal: Positive for back pain and muscle cramps.   All other systems reviewed and are negative.  No flowsheet data found.     PHYSICAL EXAM:   VS:  BP 124/80   Pulse 78   Ht '6\' 2"'$  (1.88 m)   Wt 223  lb (101.2 kg)   SpO2 98%   BMI 28.63 kg/m    GEN: Well nourished, well developed, in no acute distress  HEENT: normal  Neck: no JVD, carotid bruits, or masses Cardiac: RRR; no murmurs, rubs, or gallops,no edema.  Intact distal pulses bilaterally.  Respiratory:  Fine inspiratory crackles GI: soft, nontender, nondistended, + BS MS: no deformity or atrophy  Skin: warm and dry, no rash Neuro:  Alert and Oriented x 3, Strength and sensation are intact Psych: euthymic mood, full affect  Wt Readings from Last 3 Encounters:  08/02/16 223 lb (101.2 kg)  08/03/15 223 lb (101.2 kg)  01/31/15 223 lb (101.2 kg)      Studies/Labs Reviewed:   EKG:  EKG is ordered today.  The ekg ordered today demonstrates NSR at 71bpm with septal infarct and no ST changes  Recent Labs: No results found for requested labs within last 8760 hours.   Lipid Panel    Component Value Date/Time   CHOL 141 07/22/2014 0423   TRIG 61 07/22/2014 0423    HDL 50 07/22/2014 0423   CHOLHDL 2.8 07/22/2014 0423   VLDL 12 07/22/2014 0423   LDLCALC 79 07/22/2014 0423    Additional studies/ records that were reviewed today include:  none    ASSESSMENT:    1. Persistent atrial fibrillation (HCC)   2. Pain of lower extremity, unspecified laterality   3. SOB (shortness of breath)      PLAN:  In order of problems listed above:  1. Persistent atrial fibrillation - he is maintaining NSR.  He will continue on Apixaban but his CHADS2VASC score is only 1. I have told him he can stop his Eliquis.  I did let him know that once he turns 75 he will need to be on anticoagulation.  2. Bilateral LE pain with ambulation that improves with rest.  I will get LE arterial dopplers to rule out PVD. 3. SOB ? Etiology - he is concerned since he was exposed to agent orange in the past. He was told recently that he had a lung nodule on chest xray.  I will get a Chest CT with contrast to rule out PE and assess lung nodule further.   I will repeat an echo to make sure LVF is normal .  He wants to hold off on stress test until all the other studies are complete.  I am suspicious that this may be COPD so I will get PFTs to evaluate further.     Medication Adjustments/Labs and Tests Ordered: Current medicines are reviewed at length with the patient today.  Concerns regarding medicines are outlined above.  Medication changes, Labs and Tests ordered today are listed in the Patient Instructions below.  Patient Instructions        Signed, Fransico Him, MD  08/02/2016 1:31 PM    [facility] [place], [place], [place]  [address] Phone: [phone]; Fax: [phone]

## 2016-08-09 ENCOUNTER — Other Ambulatory Visit: Payer: BLUE CROSS/BLUE SHIELD

## 2016-08-10 ENCOUNTER — Other Ambulatory Visit: Payer: Self-pay | Admitting: Cardiology

## 2016-08-10 DIAGNOSIS — I739 Peripheral vascular disease, unspecified: Secondary | ICD-10-CM

## 2016-08-14 ENCOUNTER — Other Ambulatory Visit: Payer: Medicare Other | Admitting: *Deleted

## 2016-08-14 DIAGNOSIS — I481 Persistent atrial fibrillation: Secondary | ICD-10-CM | POA: Diagnosis not present

## 2016-08-15 LAB — BASIC METABOLIC PANEL
BUN / CREAT RATIO: 17 (ref 10–24)
BUN: 16 mg/dL (ref 8–27)
CO2: 27 mmol/L (ref 18–29)
Calcium: 10.1 mg/dL (ref 8.6–10.2)
Chloride: 100 mmol/L (ref 96–106)
Creatinine, Ser: 0.96 mg/dL (ref 0.76–1.27)
GFR, EST AFRICAN AMERICAN: 92 mL/min/{1.73_m2} (ref >59)
GFR, EST NON AFRICAN AMERICAN: 80 mL/min/{1.73_m2} (ref >59)
Glucose: 114 mg/dL — ABNORMAL HIGH (ref 65–99)
POTASSIUM: 4.2 mmol/L (ref 3.5–5.2)
SODIUM: 140 mmol/L (ref 134–144)

## 2016-08-20 ENCOUNTER — Other Ambulatory Visit: Payer: Medicare Other

## 2016-08-21 ENCOUNTER — Other Ambulatory Visit: Payer: Self-pay

## 2016-08-21 ENCOUNTER — Ambulatory Visit (HOSPITAL_COMMUNITY): Payer: Medicare Other | Attending: Cardiovascular Disease

## 2016-08-21 ENCOUNTER — Ambulatory Visit (INDEPENDENT_AMBULATORY_CARE_PROVIDER_SITE_OTHER)
Admission: RE | Admit: 2016-08-21 | Discharge: 2016-08-21 | Disposition: A | Discharge status: Home / Self Care | Payer: Medicare Other | Source: Ambulatory Visit | Attending: Cardiology | Admitting: Cardiology

## 2016-08-21 DIAGNOSIS — I5030 Unspecified diastolic (congestive) heart failure: Secondary | ICD-10-CM | POA: Diagnosis not present

## 2016-08-21 DIAGNOSIS — I361 Nonrheumatic tricuspid (valve) insufficiency: Secondary | ICD-10-CM | POA: Diagnosis not present

## 2016-08-21 DIAGNOSIS — R0602 Shortness of breath: Secondary | ICD-10-CM

## 2016-08-21 MED ORDER — IOPAMIDOL (ISOVUE-370) INJECTION 76%
80.0000 mL | Freq: Once | INTRAVENOUS | Status: AC | PRN
  Administered 2016-08-21: 80 mL via INTRAVENOUS

## 2016-08-22 ENCOUNTER — Encounter: Payer: Self-pay | Admitting: Cardiology

## 2016-08-22 ENCOUNTER — Ambulatory Visit (HOSPITAL_COMMUNITY)
Admission: RE | Admit: 2016-08-22 | Discharge: 2016-08-22 | Disposition: A | Discharge status: Home / Self Care | Payer: Medicare Other | Source: Ambulatory Visit | Attending: Cardiovascular Disease | Admitting: Cardiovascular Disease

## 2016-08-22 DIAGNOSIS — I739 Peripheral vascular disease, unspecified: Secondary | ICD-10-CM | POA: Diagnosis not present

## 2016-08-22 DIAGNOSIS — I2510 Atherosclerotic heart disease of native coronary artery without angina pectoris: Secondary | ICD-10-CM | POA: Insufficient documentation

## 2016-08-31 ENCOUNTER — Telehealth: Payer: Self-pay | Admitting: Cardiology

## 2016-08-31 DIAGNOSIS — I2584 Coronary atherosclerosis due to calcified coronary lesion: Secondary | ICD-10-CM

## 2016-08-31 DIAGNOSIS — I2510 Atherosclerotic heart disease of native coronary artery without angina pectoris: Secondary | ICD-10-CM

## 2016-08-31 DIAGNOSIS — R0602 Shortness of breath: Secondary | ICD-10-CM

## 2016-08-31 NOTE — Telephone Encounter (Signed)
Follow Up    Pt returning phone call from nurse regarding test results... Requesting call back

## 2016-08-31 NOTE — Telephone Encounter (Signed)
lmtcb

## 2016-09-03 NOTE — Telephone Encounter (Signed)
   Notes recorded by Sueanne Margarita, MD on 08/22/2016 at 9:21 AM EDT Due to moderate LVH with septal wall thickness of 69m and very mild dynamic outflow obstruction, need to consider HOCM. Please get a cardiac MRI with gad to rule out HOCM - Dr. NMeda Coffeeto read    [facility] and went over the results with him and that Dr. TRadford Paxwanted to order a cardiac MRI for further cardiac investigation. Patient refuses to have a cardiac MRI due to PTSD and claustrophobia. He states he will not get in a small tube-like enclosed space. I asked patient if he was willing to have cardiac MRI even if given sedation, and he still declined MRI. Patient wants to go to plan B. Advised patient I would let Dr. TRadford Paxknow to see what she would like to do since he is refusing the Cardiac MRI.

## 2016-09-03 NOTE — Telephone Encounter (Signed)
Please find out when the genetic counselor is going to start clinic at our office and get him in to have genetic testing for HOCM instead of MRI

## 2016-09-03 NOTE — Telephone Encounter (Signed)
New Message    Pt is returning your call about test results

## 2016-09-03 NOTE — Telephone Encounter (Signed)
LMTCB

## 2016-09-03 NOTE — Telephone Encounter (Signed)
Notes recorded by Sueanne Margarita, MD on 08/22/2016 at 9:30 AM EDT Please let patient know that CT showed no pulmonary emboli but there is possibility of interstitial PNA. Given his SOB I would like him referred to Pulmonary for eval. He also had mild coronary artery calcifications so please set up for stress myoview to rule out ischemia.   Spoke with patient about results regarding the CT scan of his chest. Patient verbalized understanding and denied any symptoms of shortness of breath unless he goes up a flight of stairs. He states he plays tennis now and is active. Made patient aware that CT scan showed possible interstitial PNA but no pulmonary embolism was evident. Told patient that Dr. Radford Pax wants him to see a pulmonologist to further evaluate shortness of breath. Also let patient know that Dr. Radford Pax wanted to him to have a Myoview Stress Test performed to rule out ischemia. Went over the instructions with patient about the procedure. Patient wants scheduler to call him tomorrow morning on Tuesday 5/1/ 2018 to schedule the Stress Test because he will have his calender and appointment book in front of him. He would like an appointment around 12:30 pm. Patient had knee replacement in 2013 and says walking on a treadmill is no problem.  Will put in referral for him to see a Pulmonologist.  Will put in order for Myoview Stress Test.

## 2016-09-07 NOTE — Telephone Encounter (Signed)
Patient agrees to appointment with Dr. Broadus John for genetic testing. He understands he will be called to schedule appointment. Reiterated to patient to compile as much family medical history as possible prior to appointment. He was grateful for call and agrees with treatment plan.

## 2016-09-12 NOTE — Telephone Encounter (Signed)
Patient has been scheduled 6/28.

## 2016-09-13 ENCOUNTER — Encounter (HOSPITAL_COMMUNITY): Payer: Medicare Other

## 2016-09-17 ENCOUNTER — Telehealth (HOSPITAL_COMMUNITY): Payer: Self-pay | Admitting: *Deleted

## 2016-09-17 NOTE — Telephone Encounter (Signed)
Patient given detailed instructions per Myocardial Perfusion Study Information Sheet for the test on 09/19/16 at 1000. Patient notified to arrive 15 minutes early and that it is imperative to arrive on time for appointment to keep from having the test rescheduled.  If you need to cancel or reschedule your appointment, please call the office within 24 hours of your appointment. Failure to do so may result in a cancellation of your appointment, and a $50 no show fee. Patient verbalized understanding.Byron Tipping, [place]

## 2016-09-19 ENCOUNTER — Ambulatory Visit (HOSPITAL_COMMUNITY): Payer: Medicare Other | Attending: Cardiovascular Disease

## 2016-09-19 DIAGNOSIS — I2584 Coronary atherosclerosis due to calcified coronary lesion: Secondary | ICD-10-CM | POA: Insufficient documentation

## 2016-09-19 DIAGNOSIS — I2510 Atherosclerotic heart disease of native coronary artery without angina pectoris: Secondary | ICD-10-CM | POA: Diagnosis not present

## 2016-09-19 DIAGNOSIS — R0602 Shortness of breath: Secondary | ICD-10-CM | POA: Insufficient documentation

## 2016-09-19 LAB — MYOCARDIAL PERFUSION IMAGING
CHL CUP RESTING HR STRESS: 61 {beats}/min
LHR: 0.34
LV sys vol: 38 mL
LVDIAVOL: 113 mL (ref 62–150)
NUC STRESS TID: 0.96
Peak HR: 123 {beats}/min
SDS: 0
SRS: 6
SSS: 6

## 2016-09-19 MED ORDER — TECHNETIUM TC 99M TETROFOSMIN IV KIT
10.4000 | PACK | Freq: Once | INTRAVENOUS | Status: AC | PRN
  Administered 2016-09-19: 10.4 via INTRAVENOUS
  Filled 2016-09-19: qty 11

## 2016-09-19 MED ORDER — TECHNETIUM TC 99M TETROFOSMIN IV KIT
32.4000 | PACK | Freq: Once | INTRAVENOUS | Status: AC | PRN
  Administered 2016-09-19: 32.4 via INTRAVENOUS
  Filled 2016-09-19: qty 33

## 2016-09-19 MED ORDER — REGADENOSON 0.4 MG/5ML IV SOLN
0.4000 mg | Freq: Once | INTRAVENOUS | Status: AC
  Administered 2016-09-19: 0.4 mg via INTRAVENOUS

## 2016-09-27 ENCOUNTER — Ambulatory Visit (INDEPENDENT_AMBULATORY_CARE_PROVIDER_SITE_OTHER): Payer: Medicare Other | Admitting: Internal Medicine

## 2016-09-27 DIAGNOSIS — R0602 Shortness of breath: Secondary | ICD-10-CM | POA: Diagnosis not present

## 2016-09-27 LAB — PULMONARY FUNCTION TEST
DL/VA % pred: 76 %
DL/VA: 3.64 ml/min/mmHg/L
DLCO COR % PRED: 52 %
DLCO UNC: 18.62 ml/min/mmHg
DLCO cor: 18.57 ml/min/mmHg
DLCO unc % pred: 52 %
FEF 25-75 POST: 2.91 L/s
FEF 25-75 Pre: 1.31 L/sec
FEF2575-%Change-Post: 123 %
FEF2575-%Pred-Post: 108 %
FEF2575-%Pred-Pre: 48 %
FEV1-%CHANGE-POST: 2 %
FEV1-%PRED-POST: 90 %
FEV1-%Pred-Pre: 88 %
FEV1-POST: 3.23 L
FEV1-PRE: 3.15 L
FEV1FVC-%Change-Post: 4 %
FEV1FVC-%PRED-PRE: 103 %
FEV6-%Change-Post: -2 %
FEV6-%PRED-POST: 87 %
FEV6-%PRED-PRE: 89 %
FEV6-POST: 4.01 L
FEV6-PRE: 4.11 L
FEV6FVC-%CHANGE-POST: 0 %
FEV6FVC-%PRED-POST: 103 %
FEV6FVC-%PRED-PRE: 104 %
FVC-%CHANGE-POST: -1 %
FVC-%Pred-Post: 84 %
FVC-%Pred-Pre: 86 %
FVC-POST: 4.08 L
FVC-PRE: 4.15 L
PRE FEV6/FVC RATIO: 99 %
Post FEV1/FVC ratio: 79 %
Post FEV6/FVC ratio: 98 %
Pre FEV1/FVC ratio: 76 %

## 2016-09-27 NOTE — Progress Notes (Signed)
PFT done today. 

## 2016-10-03 ENCOUNTER — Institutional Professional Consult (permissible substitution): Payer: Medicare Other | Admitting: Internal Medicine

## 2016-10-04 ENCOUNTER — Other Ambulatory Visit (INDEPENDENT_AMBULATORY_CARE_PROVIDER_SITE_OTHER): Payer: Medicare Other

## 2016-10-04 ENCOUNTER — Encounter: Payer: Self-pay | Admitting: Pulmonary Disease

## 2016-10-04 ENCOUNTER — Ambulatory Visit (INDEPENDENT_AMBULATORY_CARE_PROVIDER_SITE_OTHER): Payer: Medicare Other | Admitting: Pulmonary Disease

## 2016-10-04 VITALS — BP 110/76 | HR 86 | Ht 74.0 in | Wt 228.3 lb

## 2016-10-04 DIAGNOSIS — J8410 Pulmonary fibrosis, unspecified: Secondary | ICD-10-CM

## 2016-10-04 LAB — CK: CK TOTAL: 443 U/L — AB (ref 7–232)

## 2016-10-04 LAB — C-REACTIVE PROTEIN: CRP: 0.3 mg/dL — AB (ref 0.5–20.0)

## 2016-10-04 LAB — SEDIMENTATION RATE: Sed Rate: 16 mm/hr (ref 0–20)

## 2016-10-04 NOTE — Patient Instructions (Signed)
I had reviewed your CT scan and pulmonary function tests. There appears to be pulmonary fibrosis of unclear etiology.   Further investigation will check blood work today including ANA with reflex, ANCA, CCP, rheumatoid factor, sedimentation rate, CRP, hypersensitivity panel, CK, aldolase. I recommend that he get a high-resolution CT scan for better evaluation of lung fibrosis. It can be to be done here or at the [facility] based on your preference.  Please give [place] a call if you wish to proceed with the CT scan here. Return to clinic as needed.

## 2016-10-04 NOTE — Progress Notes (Signed)
[name]    [id number]    1945-12-03  Primary Care Physician:Green, Christean Grief, MD  Referring Physician: Levin Erp, [place] [place], [address], [place] [address]  Chief complaint:  Consult for evaluation of dyspnea   HPI: [name] is a 71 year old with past medical history of atrial fibrillation, coronary artery disease, tobacco use. He had a CT scan of the chest for evaluation of dyspnea which showed pulmonary fibrosis. He also had PFTs which showed restriction and diffusion defect concerning for interstitial lung disease. He has been sent here for further evaluation  His chief complaint is dyspnea on exertion, climbing status. He denies dyspnea at rest, cough, sputum production, wheezing. His only on aspirin and vitamin D. However he takes multiple over-the-counter naturals and herbals including protandim, B12, omega-3, turmeric among others.   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 He is exposed to [place] in [place] Smoking history: Half pack per day for 25 years. He still continues to smoke.  Outpatient Encounter Prescriptions as of 10/04/2016  Medication Sig  . aspirin 81 MG tablet Take 81 mg by mouth daily.  . cholecalciferol (VITAMIN D) 1000 UNITS tablet Take 1,000 Units by mouth daily.  . nitroGLYCERIN (NITROSTAT) 0.4 MG SL tablet Place 1 tablet (0.4 mg total) under the tongue every 5 (five) minutes as needed for chest pain.  . [DISCONTINUED] Apixaban (ELIQUIS PO) Take by mouth 2 (two) times daily.   No facility-administered encounter medications on file as of 10/04/2016.     Allergies as of 10/04/2016  . (No Known Allergies)    Past Medical History:  Diagnosis Date  . Arthritis   . Coronary artery calcification seen on CAT scan   . PAF (paroxysmal atrial  fibrillation) ([facility]) 08/02/2014  . PTSD (post-traumatic stress disorder)    controlled    Past Surgical History:  Procedure Laterality Date  . BACK SURGERY     2000  . JOINT REPLACEMENT      left 2005  . SHOULDER ARTHROSCOPY W/ ROTATOR CUFF REPAIR  2012   right  . TOTAL KNEE ARTHROPLASTY  08/28/2011   Procedure: TOTAL KNEE ARTHROPLASTY;  Surgeon: Garald Balding, MD;  Location: [facility];  Service: Orthopedics;  Laterality: Right;    Family History  Problem Relation Age of Onset  . Lung cancer Mother   . Kidney cancer Father   . Heart attack Maternal Grandfather   . Alcoholism Brother   . HIV Brother   . Anesthesia problems Neg Hx   . Hypotension Neg Hx   . Malignant hyperthermia Neg Hx   . Pseudochol deficiency Neg Hx     Social History   Social History  . Marital status: Married    Spouse name: N/A  . Number of children: N/A  . Years of education: N/A   Occupational History  . Not on file.   Social History Main Topics  . Smoking status: Current Every Day Smoker    Packs/day: 0.50    Years: 20.00    Types: Cigarettes  . Smokeless tobacco: Never Used  . Alcohol use No  . Drug use: No  . Sexual activity: Yes     Comment: Viagra at times   Other Topics Concern  . Not on file   Social History Narrative  . No narrative  on file    Review of systems: Review of Systems  Constitutional: Negative for fever and chills.  HENT: Negative.   Eyes: Negative for blurred vision.  Respiratory: as per HPI  Cardiovascular: Negative for chest pain and palpitations.  Gastrointestinal: Negative for vomiting, diarrhea, blood per rectum. Genitourinary: Negative for dysuria, urgency, frequency and hematuria.  Musculoskeletal: Negative for myalgias, back pain and joint pain.  Skin: Negative for itching and rash.  Neurological: Negative for dizziness, tremors, focal weakness, seizures and loss of consciousness.  Endo/Heme/Allergies: Negative for environmental allergies.    Psychiatric/Behavioral: Negative for depression, suicidal ideas and hallucinations.  All other systems reviewed and are negative.  Physical Exam: Blood pressure 110/76, pulse 86, height 6\' 2"  (1.88 m), weight 228 lb 4.8 oz (103.6 kg), SpO2 97 %. Gen:      No acute distress HEENT:  EOMI, sclera anicteric Neck:     No masses; no thyromegaly Lungs:    Clear to auscultation bilaterally; normal respiratory effort CV:         Regular rate and rhythm; no murmurs Abd:      + bowel sounds; soft, non-tender; no palpable masses, no distension Ext:    No edema; adequate peripheral perfusion Skin:      Warm and dry; no rash Neuro: alert and oriented x 3 Psych: normal mood and affect  Data Reviewed: PFTs 09/27/16 FVC 4.08 (84%) FEV1 3.23 (90%) F/F 79 TLC 70% DLCO 52% Mild restriction with moderate-severe DLCO impairment. Small airway disease with improvement in flow rates postbronchodilator  CT angio chest 08/21/16-no evidence of pulmonary emboli, mild atherosclerosis, coronary artery calcification. Retuicalr markings which have basilar predominant. No clear evidence of honeycombing. No consolidation, lung infiltrate. This appears to have progressed slightly from 2016. I reviewed all images personally.  Assessment:  Consult for evaluation of interstitial lung disease I have reviewed his PFTs and CT scan which shows basal predominant reticular opacities that is progressive from 2016. 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 He does not have any significant exposures except for agent orange. There is no exposure to asbestos. He does not have signs and symptoms of connective tissue disease or autoimmune process. We had an extensive discussion with him regarding the possible etiologies of pulmonary fibrosis and the workup that is needed.    We will further evaluate by getting serologies for ILD including ANA with reflex, ANCA, CCP, rheumatoid factor, sedimentation rate, CRP,  hypersensitivity panel, CK, aldolase. I have recommended that he get a high-resolution CT for better evaluation of the pulmonary fibrosis. He wants to get the blood test done today but is not sure if he wants to follow-up with [place] or at the [facility]. If he does follow at the [facility] he will get his PCP to refer to a pulmonologist and we will send our records. He'll call us if he wants to proceed with a high-resolution CT and follow-up clinic appointment at the pulmonary clinic at [facility]   Plan/Recommendations: - Serologies for autoimmune, CTD work up of pulmonary fibrosis - Recommend that he get high resolution CT of chest for better evaluation here or at the [facility]  Pt to call us if he wishes to follow with [place].   More then 1/2 the time of the 60 min visit was spent in counseling and/or coordination of care with the patient and family.  Marshell Garfinkel MD [facility] Pager [phone] 10/04/2016, 4:01 PM  CC: Levin Erp, MD

## 2016-10-05 LAB — RHEUMATOID FACTOR

## 2016-10-05 LAB — CYCLIC CITRUL PEPTIDE ANTIBODY, IGG

## 2016-10-05 LAB — ANCA SCREEN W REFLEX TITER: ANCA Screen: NEGATIVE

## 2016-10-08 LAB — HYPERSENSITIVITY PNEUMONITIS
A. Pullulans Abs: NEGATIVE
A.Fumigatus #1 Abs: NEGATIVE
Micropolyspora faeni, IgG: NEGATIVE
Pigeon Serum Abs: NEGATIVE
THERMOACTINOMYCES VULGARIS IGG: NEGATIVE
Thermoact. Saccharii: NEGATIVE

## 2016-10-08 LAB — ALDOLASE: ALDOLASE: 6.1 U/L (ref <=8.1)

## 2016-10-08 LAB — ANA W/REFLEX: ANA: NEGATIVE

## 2016-11-01 ENCOUNTER — Encounter: Payer: Medicare Other | Admitting: Genetic Counselor

## 2016-11-29 DIAGNOSIS — G9009 Other idiopathic peripheral autonomic neuropathy: Secondary | ICD-10-CM | POA: Diagnosis not present

## 2016-12-03 DIAGNOSIS — M79605 Pain in left leg: Secondary | ICD-10-CM | POA: Diagnosis not present

## 2016-12-03 DIAGNOSIS — M545 Low back pain: Secondary | ICD-10-CM | POA: Diagnosis not present

## 2016-12-03 DIAGNOSIS — M79604 Pain in right leg: Secondary | ICD-10-CM | POA: Diagnosis not present

## 2016-12-06 DIAGNOSIS — M545 Low back pain: Secondary | ICD-10-CM | POA: Diagnosis not present

## 2016-12-06 DIAGNOSIS — M79605 Pain in left leg: Secondary | ICD-10-CM | POA: Diagnosis not present

## 2016-12-06 DIAGNOSIS — M79604 Pain in right leg: Secondary | ICD-10-CM | POA: Diagnosis not present

## 2016-12-13 DIAGNOSIS — M545 Low back pain: Secondary | ICD-10-CM | POA: Diagnosis not present

## 2016-12-13 DIAGNOSIS — M79605 Pain in left leg: Secondary | ICD-10-CM | POA: Diagnosis not present

## 2016-12-13 DIAGNOSIS — M79604 Pain in right leg: Secondary | ICD-10-CM | POA: Diagnosis not present

## 2016-12-18 DIAGNOSIS — M79604 Pain in right leg: Secondary | ICD-10-CM | POA: Diagnosis not present

## 2016-12-18 DIAGNOSIS — M79605 Pain in left leg: Secondary | ICD-10-CM | POA: Diagnosis not present

## 2016-12-18 DIAGNOSIS — M545 Low back pain: Secondary | ICD-10-CM | POA: Diagnosis not present

## 2016-12-28 DIAGNOSIS — M79604 Pain in right leg: Secondary | ICD-10-CM | POA: Diagnosis not present

## 2016-12-28 DIAGNOSIS — M79605 Pain in left leg: Secondary | ICD-10-CM | POA: Diagnosis not present

## 2016-12-28 DIAGNOSIS — M545 Low back pain: Secondary | ICD-10-CM | POA: Diagnosis not present

## 2017-01-02 DIAGNOSIS — M545 Low back pain: Secondary | ICD-10-CM | POA: Diagnosis not present

## 2017-01-02 DIAGNOSIS — M79605 Pain in left leg: Secondary | ICD-10-CM | POA: Diagnosis not present

## 2017-01-02 DIAGNOSIS — M79604 Pain in right leg: Secondary | ICD-10-CM | POA: Diagnosis not present

## 2017-01-09 DIAGNOSIS — M545 Low back pain: Secondary | ICD-10-CM | POA: Diagnosis not present

## 2017-01-09 DIAGNOSIS — M79604 Pain in right leg: Secondary | ICD-10-CM | POA: Diagnosis not present

## 2017-01-09 DIAGNOSIS — M79605 Pain in left leg: Secondary | ICD-10-CM | POA: Diagnosis not present

## 2017-01-18 DIAGNOSIS — M545 Low back pain: Secondary | ICD-10-CM | POA: Diagnosis not present

## 2017-01-18 DIAGNOSIS — M79605 Pain in left leg: Secondary | ICD-10-CM | POA: Diagnosis not present

## 2017-01-18 DIAGNOSIS — M79604 Pain in right leg: Secondary | ICD-10-CM | POA: Diagnosis not present

## 2017-01-23 DIAGNOSIS — M79604 Pain in right leg: Secondary | ICD-10-CM | POA: Diagnosis not present

## 2017-01-23 DIAGNOSIS — M545 Low back pain: Secondary | ICD-10-CM | POA: Diagnosis not present

## 2017-01-23 DIAGNOSIS — M79605 Pain in left leg: Secondary | ICD-10-CM | POA: Diagnosis not present

## 2017-01-30 DIAGNOSIS — M79605 Pain in left leg: Secondary | ICD-10-CM | POA: Diagnosis not present

## 2017-01-30 DIAGNOSIS — M79604 Pain in right leg: Secondary | ICD-10-CM | POA: Diagnosis not present

## 2017-01-30 DIAGNOSIS — M545 Low back pain: Secondary | ICD-10-CM | POA: Diagnosis not present

## 2017-02-06 DIAGNOSIS — M79604 Pain in right leg: Secondary | ICD-10-CM | POA: Diagnosis not present

## 2017-02-06 DIAGNOSIS — M79605 Pain in left leg: Secondary | ICD-10-CM | POA: Diagnosis not present

## 2017-02-06 DIAGNOSIS — M545 Low back pain: Secondary | ICD-10-CM | POA: Diagnosis not present

## 2017-02-13 DIAGNOSIS — M79604 Pain in right leg: Secondary | ICD-10-CM | POA: Diagnosis not present

## 2017-02-13 DIAGNOSIS — M79605 Pain in left leg: Secondary | ICD-10-CM | POA: Diagnosis not present

## 2017-02-13 DIAGNOSIS — M545 Low back pain: Secondary | ICD-10-CM | POA: Diagnosis not present

## 2017-02-15 DIAGNOSIS — J069 Acute upper respiratory infection, unspecified: Secondary | ICD-10-CM | POA: Diagnosis not present

## 2017-02-20 DIAGNOSIS — M79604 Pain in right leg: Secondary | ICD-10-CM | POA: Diagnosis not present

## 2017-02-20 DIAGNOSIS — M79605 Pain in left leg: Secondary | ICD-10-CM | POA: Diagnosis not present

## 2017-02-20 DIAGNOSIS — M5136 Other intervertebral disc degeneration, lumbar region: Secondary | ICD-10-CM | POA: Diagnosis not present

## 2017-02-20 DIAGNOSIS — M545 Low back pain: Secondary | ICD-10-CM | POA: Diagnosis not present

## 2017-02-20 DIAGNOSIS — M47817 Spondylosis without myelopathy or radiculopathy, lumbosacral region: Secondary | ICD-10-CM | POA: Diagnosis not present

## 2017-02-20 DIAGNOSIS — M5137 Other intervertebral disc degeneration, lumbosacral region: Secondary | ICD-10-CM | POA: Diagnosis not present

## 2017-02-20 DIAGNOSIS — M47816 Spondylosis without myelopathy or radiculopathy, lumbar region: Secondary | ICD-10-CM | POA: Diagnosis not present

## 2017-02-20 DIAGNOSIS — M4316 Spondylolisthesis, lumbar region: Secondary | ICD-10-CM | POA: Diagnosis not present

## 2017-03-04 DIAGNOSIS — M545 Low back pain: Secondary | ICD-10-CM | POA: Diagnosis not present

## 2017-03-04 DIAGNOSIS — M79604 Pain in right leg: Secondary | ICD-10-CM | POA: Diagnosis not present

## 2017-03-04 DIAGNOSIS — M79605 Pain in left leg: Secondary | ICD-10-CM | POA: Diagnosis not present

## 2017-03-15 DIAGNOSIS — M79605 Pain in left leg: Secondary | ICD-10-CM | POA: Diagnosis not present

## 2017-03-15 DIAGNOSIS — M545 Low back pain: Secondary | ICD-10-CM | POA: Diagnosis not present

## 2017-03-15 DIAGNOSIS — M79604 Pain in right leg: Secondary | ICD-10-CM | POA: Diagnosis not present

## 2017-03-21 DIAGNOSIS — M79604 Pain in right leg: Secondary | ICD-10-CM | POA: Diagnosis not present

## 2017-03-21 DIAGNOSIS — M79605 Pain in left leg: Secondary | ICD-10-CM | POA: Diagnosis not present

## 2017-03-21 DIAGNOSIS — M545 Low back pain: Secondary | ICD-10-CM | POA: Diagnosis not present

## 2017-05-23 DIAGNOSIS — M545 Low back pain: Secondary | ICD-10-CM | POA: Diagnosis not present

## 2017-05-23 DIAGNOSIS — M79605 Pain in left leg: Secondary | ICD-10-CM | POA: Diagnosis not present

## 2017-05-23 DIAGNOSIS — M79604 Pain in right leg: Secondary | ICD-10-CM | POA: Diagnosis not present

## 2017-05-24 DIAGNOSIS — C44311 Basal cell carcinoma of skin of nose: Secondary | ICD-10-CM | POA: Diagnosis not present

## 2017-05-24 DIAGNOSIS — L918 Other hypertrophic disorders of the skin: Secondary | ICD-10-CM | POA: Diagnosis not present

## 2017-05-24 DIAGNOSIS — C44722 Squamous cell carcinoma of skin of right lower limb, including hip: Secondary | ICD-10-CM | POA: Diagnosis not present

## 2017-05-24 DIAGNOSIS — D485 Neoplasm of uncertain behavior of skin: Secondary | ICD-10-CM | POA: Diagnosis not present

## 2017-06-06 DIAGNOSIS — M79604 Pain in right leg: Secondary | ICD-10-CM | POA: Diagnosis not present

## 2017-06-06 DIAGNOSIS — M545 Low back pain: Secondary | ICD-10-CM | POA: Diagnosis not present

## 2017-06-06 DIAGNOSIS — M79605 Pain in left leg: Secondary | ICD-10-CM | POA: Diagnosis not present

## 2017-06-12 DIAGNOSIS — C44311 Basal cell carcinoma of skin of nose: Secondary | ICD-10-CM | POA: Diagnosis not present

## 2017-06-12 DIAGNOSIS — Z85828 Personal history of other malignant neoplasm of skin: Secondary | ICD-10-CM | POA: Diagnosis not present

## 2017-06-20 DIAGNOSIS — M545 Low back pain: Secondary | ICD-10-CM | POA: Diagnosis not present

## 2017-06-20 DIAGNOSIS — M79604 Pain in right leg: Secondary | ICD-10-CM | POA: Diagnosis not present

## 2017-06-20 DIAGNOSIS — M79605 Pain in left leg: Secondary | ICD-10-CM | POA: Diagnosis not present

## 2017-08-19 ENCOUNTER — Encounter: Payer: Self-pay | Admitting: Pulmonary Disease

## 2017-08-19 ENCOUNTER — Ambulatory Visit (INDEPENDENT_AMBULATORY_CARE_PROVIDER_SITE_OTHER): Payer: No Typology Code available for payment source | Admitting: Pulmonary Disease

## 2017-08-19 VITALS — BP 120/68 | HR 75 | Ht 74.0 in | Wt 211.8 lb

## 2017-08-19 DIAGNOSIS — J8410 Pulmonary fibrosis, unspecified: Secondary | ICD-10-CM

## 2017-08-19 DIAGNOSIS — J849 Interstitial pulmonary disease, unspecified: Secondary | ICD-10-CM | POA: Diagnosis not present

## 2017-08-19 NOTE — Progress Notes (Signed)
[name]    [id number]    11/28/1945  Primary Care Physician:Green, Christean Grief, MD  Referring Physician: Levin Erp, [place], [address], [place] [address]  Chief complaint: Follow-up for dyspnea, ILD  HPI: [name] is a 72 year old with past medical history of atrial fibrillation, coronary artery disease, tobacco use. He had a CT scan of the chest for evaluation of dyspnea which showed pulmonary fibrosis. He also had PFTs which showed restriction and diffusion defect concerning for interstitial lung disease. He has been sent here for further evaluation  His chief complaint is dyspnea on exertion, climbing status. He denies dyspnea at rest, cough, sputum production, wheezing. His only on aspirin and vitamin D. However he takes multiple over-the-counter naturals and herbals including protandim, B12, omega-3, turmeric among others.   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 He is exposed to [place] in [place] Smoking history: Half pack per day for 25 years. He still continues to smoke.  Interim history: At last visit we recommended a high-resolution CT.  However he wished to follow-up at the [facility]. He has been referred back after he got a regular CT at the [facility] showing interstitial lung disease and pulmonary nodules.  Outpatient Encounter Medications as of 08/19/2017  Medication Sig  . aspirin 81 MG tablet Take 81 mg by mouth daily.  . cholecalciferol (VITAMIN D) 1000 UNITS tablet Take 1,000 Units by mouth daily.  . nitroGLYCERIN (NITROSTAT) 0.4 MG SL tablet Place 1 tablet (0.4 mg total) under the tongue every 5 (five) minutes as needed for chest pain.   No facility-administered encounter medications on file as of 08/19/2017.     Allergies as of 08/19/2017  . (No Known Allergies)      Past Medical History:  Diagnosis Date  . Arthritis   . Coronary artery calcification seen on CAT scan   . PAF (paroxysmal atrial fibrillation) ([place]) 08/02/2014  . PTSD (post-traumatic stress disorder)    controlled    Past Surgical History:  Procedure Laterality Date  . BACK SURGERY     2000  . JOINT REPLACEMENT      left 2005  . SHOULDER ARTHROSCOPY W/ ROTATOR CUFF REPAIR  2012   right  . TOTAL KNEE ARTHROPLASTY  08/28/2011   Procedure: TOTAL KNEE ARTHROPLASTY;  Surgeon: Garald Balding, MD;  Location: [facility];  Service: Orthopedics;  Laterality: Right;    Family History  Problem Relation Age of Onset  . Lung cancer Mother   . Kidney cancer Father   . Heart attack Maternal Grandfather   . Alcoholism Brother   . HIV Brother   . Anesthesia problems Neg Hx   . Hypotension Neg Hx   . Malignant hyperthermia Neg Hx   . Pseudochol deficiency Neg Hx     Social History   Socioeconomic History  . Marital status: Married    Spouse name: Not on file  . Number of children: Not on file  . Years of education: Not on file  . Highest education level: Not on file  Occupational History  . Not on file  Social Needs  . Financial resource strain: Not on file  . Food insecurity:    Worry: Not on file    Inability: Not on file  . Transportation needs:    Medical: Not  on file    Non-medical: Not on file  Tobacco Use  . Smoking status: Current Every Day Smoker    Packs/day: 0.50    Years: 20.00    Pack years: 10.00    Types: Cigarettes  . Smokeless tobacco: Never Used  Substance and Sexual Activity  . Alcohol use: No  . Drug use: No  . Sexual activity: Yes    Comment: Viagra at times  Lifestyle  . Physical activity:    Days per week: Not on file    Minutes per session: Not on file  . Stress: Not on file  Relationships  . Social connections:    Talks on phone: Not on file    Gets together: Not on file    Attends religious service: Not on file    Active member of  club or organization: Not on file    Attends meetings of clubs or organizations: Not on file    Relationship status: Not on file  . Intimate partner violence:    Fear of current or ex partner: Not on file    Emotionally abused: Not on file    Physically abused: Not on file    Forced sexual activity: Not on file  Other Topics Concern  . Not on file  Social History Narrative  . Not on file    Review of systems: Review of Systems  Constitutional: Negative for fever and chills.  HENT: Negative.   Eyes: Negative for blurred vision.  Respiratory: as per HPI  Cardiovascular: Negative for chest pain and palpitations.  Gastrointestinal: Negative for vomiting, diarrhea, blood per rectum. Genitourinary: Negative for dysuria, urgency, frequency and hematuria.  Musculoskeletal: Negative for myalgias, back pain and joint pain.  Skin: Negative for itching and rash.  Neurological: Negative for dizziness, tremors, focal weakness, seizures and loss of consciousness.  Endo/Heme/Allergies: Negative for environmental allergies.  Psychiatric/Behavioral: Negative for depression, suicidal ideas and hallucinations.  All other systems reviewed and are negative.  Physical Exam: Blood pressure 120/68, pulse 75, height 6\' 2"  (1.88 m), weight 211 lb 12.8 oz (96.1 kg), SpO2 96 %. Gen:      No acute distress HEENT:  EOMI, sclera anicteric Neck:     No masses; no thyromegaly Lungs:    Clear to auscultation bilaterally; normal respiratory effort CV:         Regular rate and rhythm; no murmurs Abd:      + bowel sounds; soft, non-tender; no palpable masses, no distension Ext:    No edema; adequate peripheral perfusion Skin:      Warm and dry; no rash Neuro: alert and oriented x 3 Psych: normal mood and affect  Data Reviewed: PFTs 09/27/16 FVC 4.08 (84%), FEV1 3.23 (90%), F/F 79, TLC 70%, DLCO 52% Mild restriction with moderate-severe DLCO impairment. Small airway disease with improvement in flow rates  postbronchodilator  Imaging CT angio chest 08/21/16-no evidence of pulmonary emboli, mild atherosclerosis, coronary artery calcification. Retuicalr markings which have basilar predominant. No clear evidence of honeycombing. No consolidation, lung infiltrate. This appears to have progressed slightly from 2016. I reviewed all images personally.  Imaging from the [facility] Chest x-ray 05/20/17 Chronic lung changes, bibasilar scarring  CT chest 05/20/17 Widespread subpleural reticulation greatest in the bases, groundglass opacities at the bases, sub pleural cystic changes in the upper aspect of the lung Scattered nodular opacities-10 mm in the right lower lobe, 6 mm the left lung apex, 7 mm in the right lung apex. Left adrenal adenoma.  Labs:  Hypersensitivity profile 10/04/16-negative Connective tissue serology 10/04/16-ANA, CCP, rheumatoid factor, CRP, aldolase-negative CK 443  Assessment:  Follow-up for interstitial lung disease PFTs and CT scan shows basal predominant reticular opacities that is progressive from 2016. 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 He does not have any significant exposures except for agent orange. There is no exposure to asbestos. He does not have signs and symptoms of connective tissue disease or autoimmune process. We had an extensive discussion with him regarding the possible etiologies of pulmonary fibrosis and the workup that is needed.    Workup for connective tissue disease is negative. Schedule high-resolution CT of the chest  Sub cm pulmonary nodule Noted on the CT of the chest in jan 2019 Can be reevaluated on high-resolution CT of the chest.   Plan/Recommendations: - High-resolution CT of the chest.    Marshell Garfinkel MD [facility] Pager [phone] 08/19/2017, 1:40 PM  CC: Levin Erp, MD

## 2017-08-19 NOTE — Patient Instructions (Signed)
We will schedule you for high-resolution CT of the chest Please see if we can get the last CT scan images from the [facility] I will see you back in clinic after the CT to plan for further steps.

## 2017-08-26 ENCOUNTER — Telehealth: Payer: Self-pay | Admitting: Pulmonary Disease

## 2017-08-26 NOTE — Telephone Encounter (Signed)
Received CD from up front. Handled CD to [facility]. Will route to [facility] for follow up.

## 2017-08-26 NOTE — Telephone Encounter (Signed)
Disc has been placed in Dr. Matilde Bash look at for review.

## 2017-08-29 NOTE — Telephone Encounter (Signed)
Dr. Vaughan Browner please advise if you have looked at this disc. Thanks.

## 2017-09-03 ENCOUNTER — Ambulatory Visit (INDEPENDENT_AMBULATORY_CARE_PROVIDER_SITE_OTHER)
Admission: RE | Admit: 2017-09-03 | Discharge: 2017-09-03 | Disposition: A | Discharge status: Home / Self Care | Payer: No Typology Code available for payment source | Source: Ambulatory Visit | Attending: Pulmonary Disease | Admitting: Pulmonary Disease

## 2017-09-03 DIAGNOSIS — J849 Interstitial pulmonary disease, unspecified: Secondary | ICD-10-CM

## 2017-09-03 NOTE — Telephone Encounter (Signed)
Dr. Mannam - please advise. Thanks. 

## 2017-09-05 ENCOUNTER — Other Ambulatory Visit: Payer: Self-pay

## 2017-09-06 ENCOUNTER — Telehealth: Payer: Self-pay | Admitting: Pulmonary Disease

## 2017-09-06 NOTE — Telephone Encounter (Signed)
lmtcb for pt.  

## 2017-09-06 NOTE — Telephone Encounter (Signed)
Pt returned phone call; pt contact # [phone]

## 2017-09-06 NOTE — Telephone Encounter (Signed)
ATC pt, no answer. Left message for pt to call back.   Notes recorded by Marshell Garfinkel, MD on 09/05/2017 at 12:20 PM EDT Please let patient know that CT confirms scarring in the lung that appear slightly worse compared to before.  Order spirometry, diffusion capacity, 6 MW and follow up in clinic to discuss further steps and treatment.

## 2017-09-09 NOTE — Telephone Encounter (Signed)
Patient returned call, CB is [phone]

## 2017-09-09 NOTE — Telephone Encounter (Signed)
Dr. Vaughan Browner please advise if you have looked at this disc. Thanks!

## 2017-09-09 NOTE — Telephone Encounter (Signed)
Called and spoke with patient. He has been scheduled for the PFT as well as the 6 minute walk and a follow up visit with SG due to RA being booked until June. Patient is aware of this. Patient states that he may not be able to complete the PFT due to having PTSD.

## 2017-09-11 NOTE — Telephone Encounter (Signed)
I could not locate the disc. However we have reviewed the recent high res CT with the patient. Plan is to get lung test and follow in clinic for discussion about pulmonary fibrosis and possible treatment with antifibrotics

## 2017-09-11 NOTE — Telephone Encounter (Signed)
lmtcb x1 for pt. 

## 2017-09-11 NOTE — Addendum Note (Signed)
Addended by: Lorane Gell on: 09/11/2017 09:41 AM   Modules accepted: Orders

## 2017-09-12 ENCOUNTER — Ambulatory Visit (INDEPENDENT_AMBULATORY_CARE_PROVIDER_SITE_OTHER): Payer: No Typology Code available for payment source | Admitting: Pulmonary Disease

## 2017-09-12 DIAGNOSIS — J8410 Pulmonary fibrosis, unspecified: Secondary | ICD-10-CM

## 2017-09-12 DIAGNOSIS — J849 Interstitial pulmonary disease, unspecified: Secondary | ICD-10-CM

## 2017-09-12 NOTE — Telephone Encounter (Signed)
Attempted to call the pt. I did not receive an answer. I have left a message for pt to return our call.  

## 2017-09-12 NOTE — Progress Notes (Signed)
PFT was attempted today but could not be completed due to the pt being unable to perform the proper technique of the test.

## 2017-09-13 NOTE — Telephone Encounter (Signed)
Attempted to call pt. I did not receive an answer. I have left a message for pt to return our call.  

## 2017-09-16 ENCOUNTER — Ambulatory Visit (INDEPENDENT_AMBULATORY_CARE_PROVIDER_SITE_OTHER): Payer: No Typology Code available for payment source | Admitting: *Deleted

## 2017-09-16 ENCOUNTER — Ambulatory Visit: Payer: No Typology Code available for payment source | Admitting: *Deleted

## 2017-09-16 ENCOUNTER — Encounter: Payer: Self-pay | Admitting: Acute Care

## 2017-09-16 ENCOUNTER — Ambulatory Visit (INDEPENDENT_AMBULATORY_CARE_PROVIDER_SITE_OTHER): Payer: No Typology Code available for payment source | Admitting: Acute Care

## 2017-09-16 ENCOUNTER — Other Ambulatory Visit: Payer: No Typology Code available for payment source

## 2017-09-16 VITALS — BP 120/62 | HR 67 | Ht 74.0 in

## 2017-09-16 DIAGNOSIS — J849 Interstitial pulmonary disease, unspecified: Secondary | ICD-10-CM

## 2017-09-16 NOTE — Assessment & Plan Note (Addendum)
Baseline Dyspnea Plan: We will see if we can do the 6 minute walk today. If we can't do it today we will reschedule it. We will do additional labs today which may  help [place] identify the cause of your pulmonary fibrosis. We will schedule you with Dr. Vaughan Browner in a 30 minute slot for follow up as soon as he has availability. Please contact office for sooner follow up if symptoms do not improve or worsen or seek emergency care

## 2017-09-16 NOTE — Progress Notes (Signed)
SIX MIN WALK 09/16/2017  Medications Vitamins  Supplimental Oxygen during Test? (L/min) No  Laps 8  Partial Lap (in Meters) 6  Baseline BP (sitting) 120/60  Baseline Heartrate 78  Baseline Dyspnea (Borg Scale) 2  Baseline Fatigue (Borg Scale) 0  Baseline SPO2 97  BP (sitting) 144/70  Heartrate 103  Dyspnea (Borg Scale) 3  Fatigue (Borg Scale) 1  SPO2 94  BP (sitting) 128/60  Heartrate 84  SPO2 98  Stopped or Paused before Six Minutes No  Distance Completed 390  Tech Comments: steady walk, no complications TA/CMA

## 2017-09-16 NOTE — Progress Notes (Signed)
History of Present Illness [name] is a 72 y.o. 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 [name] He is followed by Dr. Vaughan Browner.  HPI: [name] is a 72 year old with past medical history of atrial fibrillation, coronary artery disease, tobacco use. He had a CT scan of the chest for evaluation of dyspnea which showed pulmonary fibrosis. He also had PFTs which showed restriction and diffusion defect concerning for interstitial lung disease. He has been sent here for further evaluation  His chief complaint is dyspnea on exertion, climbing status. He denies dyspnea at rest, cough, sputum production, wheezing. His only on aspirin and vitamin D. However he takes multiple over-the-counter naturals and herbals including protandim, B12, omega-3, turmeric among others.   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 He is exposed to [place] in [place] Smoking history: Half pack per day for 25 years. He still continues to smoke.  Interim history: At last visit we recommended a high-resolution CT.  However he wished to follow-up at the [place]. He has been referred back after he got a regular CT at the [place] showing interstitial lung disease and pulmonary nodules.  09/16/2017  Pt. Presents for follow up. He was seen by Dr.Mannam 08/19/2017. Plan of care after that visit was as follows:  Discussion >> Dr. Vaughan Browner PFTs and CT scan shows basal predominant reticular opacities that is progressive from 2016. 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 He does not have any significant exposures except for agent orange. There is no exposure to asbestos. He does not have signs and symptoms of connective tissue disease or autoimmune process. We had an extensive discussion with him regarding the possible etiologies of  pulmonary fibrosis and the workup that is needed.    Workup for connective tissue disease is negative. Schedule high-resolution CT of the chest  Pt. Presents today for follow up. He arrived late and 6 minute walk was not done prior to visit today.HRCT shows basilar predominant fibrotic ILD without frank honeycombing. He states she has remained at baseline in regard to his dyspnea. PFT's showed Mild restriction with moderate-severe DLCO impairment. Plan was for 6 minute walk, which has not been done. We discussed the next steps for work up of ILD. These include additional labs which we will order today . Additionally 6 minute walk. Pt. Will follow up with Dr. Vaughan Browner in a 30 minute slot to go over treatment options.   Pt. Denies any chest pain, orthopnea, hemoptysis or fever.    Test Results:  HRCT 09/04/2017>> Spectrum of findings compatible with basilar predominant fibrotic interstitial lung disease without frank honeycombing. Findings have slightly progressed since 08/21/2016 chest CT with more clear progression since 07/20/2014 chest CT. Findings are considered probable usual interstitial pneumonia (UIP). Recommend follow-up high-resolution chest CT study in 12 months. 2. One vessel coronary atherosclerosis. 3. Stable left adrenal adenoma.   PFTs 09/27/16 FVC 4.08 (84%), FEV1 3.23 (90%), F/F 79, TLC 70%, DLCO 52% Mild restriction with moderate-severe DLCO impairment. Small airway disease with improvement in flow rates postbronchodilator Conclusions: The reduced lung volumes, increased FEV1/FVC ratio and diffusion defect suggest an interstitial process such as fibrosis or interstitial inflammation. In view of the severity of the diffusion defect, studies with exercise would be helpful to evaluate the presence of hypoxemia.   Pulmonary Function Diagnosis: Mild Restriction -Interstitial Moderately  severe Diffusion Defect Small airway obstruction may be present, with response to  bronchodilator  Imaging CT angio chest 08/21/16-no evidence of pulmonary emboli, mild atherosclerosis, coronary artery calcification. Retuicalr markings which have basilar predominant. No clear evidence of honeycombing. No consolidation, lung infiltrate. This appears to have progressed slightly from 2016. I reviewed all images personally.  Imaging from the [facility] Chest x-ray 05/20/17 Chronic lung changes, bibasilar scarring  CT chest 05/20/17 Widespread subpleural reticulation greatest in the bases, groundglass opacities at the bases, sub pleural cystic changes in the upper aspect of the lung Scattered nodular opacities-10 mm in the right lower lobe, 6 mm the left lung apex, 7 mm in the right lung apex. Left adrenal adenoma.  Labs: Hypersensitivity profile 10/04/16-negative Connective tissue serology 10/04/16-ANA, CCP, rheumatoid factor, CRP, aldolase-negative CK 443  Discussion >> Dr. Vaughan Browner PFTs and CT scan shows basal predominant reticular opacities that is progressive from 2016. 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 He does not have any significant exposures except for agent orange. There is no exposure to asbestos. He does not have signs and symptoms of connective tissue disease or autoimmune process. We had an extensive discussion with him regarding the possible etiologies of pulmonary fibrosis and the workup that is needed.    Workup for connective tissue disease is negative. Schedule high-resolution CT of the chest  Sub cm pulmonary nodule Noted on the CT of the chest in jan 2019 Can be reevaluated on high-resolution CT of the chest.   Plan/Recommendations: - High-resolution CT of the chest.      CBC Latest Ref Rng & Units 07/22/2014 07/21/2014 07/20/2014  WBC 4.0 - 10.5 K/uL 8.2 7.9 12.6(H)  Hemoglobin 13.0 - 17.0 g/dL 13.3 13.3 14.9  Hematocrit 39.0 - 52.0 % 42.0 41.2 46.0  Platelets 150 - 400 K/uL 160 153 202    BMP Latest Ref Rng & Units 08/14/2016  07/22/2014 07/21/2014  Glucose 65 - 99 mg/dL 114(H) 106(H) 148(H)  BUN 8 - 27 mg/dL 16 11 14   Creatinine 0.76 - 1.27 mg/dL 0.96 0.70 0.75  BUN/Creat Ratio 10 - 24 17 - -  Sodium 134 - 144 mmol/L 140 138 136  Potassium 3.5 - 5.2 mmol/L 4.2 4.1 4.1  Chloride 96 - 106 mmol/L 100 108 105  CO2 18 - 29 mmol/L 27 22 23   Calcium 8.6 - 10.2 mg/dL 10.1 9.6 9.8    BNP    Component Value Date/Time   BNP 93.7 07/20/2014 2215    ProBNP No results found for: PROBNP  PFT    Component Value Date/Time   FEV1PRE 3.15 09/27/2016 1249   FEV1POST 3.23 09/27/2016 1249   FVCPRE 4.15 09/27/2016 1249   FVCPOST 4.08 09/27/2016 1249   DLCOUNC 18.62 09/27/2016 1249   PREFEV1FVCRT 76 09/27/2016 1249   PSTFEV1FVCRT 79 09/27/2016 1249    Ct Chest High Resolution  Result Date: 09/04/2017 CLINICAL DATA:  Follow-up interstitial lung disease. EXAM: CT CHEST WITHOUT CONTRAST TECHNIQUE: Multidetector CT imaging of the chest was performed following the standard protocol without intravenous contrast. High resolution imaging of the lungs, as well as inspiratory and expiratory imaging, was performed. COMPARISON:  08/21/2016 chest CT angiogram. FINDINGS: Cardiovascular: Normal heart size. No significant pericardial fluid/thickening. Left anterior descending coronary atherosclerosis. Mildly atherosclerotic nonaneurysmal thoracic aorta. Normal caliber pulmonary arteries. Mediastinum/Nodes: No discrete thyroid nodules. Unremarkable esophagus. No pathologically enlarged axillary, mediastinal or gross hilar lymph nodes, noting limited sensitivity for the detection of hilar adenopathy on this noncontrast study. Lungs/Pleura: No  pneumothorax. No pleural effusion. No acute consolidative airspace disease, lung masses or significant pulmonary nodules. No significant air trapping on the expiration sequence. There is patchy confluent subpleural and peripheral peribronchovascular reticulation and ground-glass attenuation in both lungs with  associated mild traction bronchiectasis and mild architectural distortion. There is a basilar predominance to these findings. These findings appear slightly progressed since 08/21/2016 chest CT, with more clear progression since 07/20/2014 chest CT. No frank honeycombing. Subpleural cystic changes in the anterior upper lobes are compatible with paraseptal emphysema. Upper abdomen: Stable partially visualized 1.8 cm left adrenal adenoma with density -2 HU. Musculoskeletal: No aggressive appearing focal osseous lesions. Stable symmetric mild gynecomastia. Marked thoracic spondylosis. IMPRESSION: 1. Spectrum of findings compatible with basilar predominant fibrotic interstitial lung disease without frank honeycombing. Findings have slightly progressed since 08/21/2016 chest CT with more clear progression since 07/20/2014 chest CT. Findings are considered probable usual interstitial pneumonia (UIP). Recommend follow-up high-resolution chest CT study in 12 months. 2. One vessel coronary atherosclerosis. 3. Stable left adrenal adenoma. Aortic Atherosclerosis (ICD10-I70.0) and Emphysema (ICD10-J43.9). Electronically Signed   By: Ilona Sorrel M.D.   On: 09/04/2017 10:38     Past medical hx Past Medical History:  Diagnosis Date  . Arthritis   . Coronary artery calcification seen on CAT scan   . PAF (paroxysmal atrial fibrillation) ([place]) 08/02/2014  . PTSD (post-traumatic stress disorder)    controlled     Social History   Tobacco Use  . Smoking status: Current Every Day Smoker    Packs/day: 0.50    Years: 20.00    Pack years: 10.00    Types: Cigarettes  . Smokeless tobacco: Never Used  Substance Use Topics  . Alcohol use: No  . Drug use: No    BobbyDesanto reports that he has been smoking cigarettes.  He has a 10.00 pack-year smoking history. He has never used smokeless tobacco. He reports that he does not drink alcohol or use drugs.  Tobacco Cessation: Current Every day smoker I have spent 4  minutes counseling patient on smoking cessation this visit.  Past surgical hx, Family hx, Social hx all reviewed.  Current Outpatient Medications on File Prior to Visit  Medication Sig  . cholecalciferol (VITAMIN D) 1000 units tablet Take 1,000 Units by mouth daily.  . cyanocobalamin 500 MCG tablet Take 500 mcg by mouth daily.  . Omega 3 1000 MG CAPS Take by mouth.   No current facility-administered medications on file prior to visit.      No Known Allergies  Review Of Systems:  Constitutional:   No  weight loss, night sweats,  Fevers, chills, fatigue, or  lassitude.  HEENT:   No headaches,  Difficulty swallowing,  Tooth/dental problems, or  Sore throat,                No sneezing, itching, ear ache, nasal congestion, post nasal drip,   CV:  No chest pain,  Orthopnea, PND, swelling in lower extremities, anasarca, dizziness, palpitations, syncope.   GI  No heartburn, indigestion, abdominal pain, nausea, vomiting, diarrhea, change in bowel habits, loss of appetite, bloody stools.   Resp: + shortness of breath with exertion less at rest.  No excess mucus, no productive cough,  No non-productive cough,  No coughing up of blood.  No change in color of mucus.  No wheezing.  No chest wall deformity  Skin: no rash or lesions.  GU: no dysuria, change in color of urine, no urgency or frequency.  No flank  pain, no hematuria   MS:  No joint pain or swelling.  No decreased range of motion.  No back pain.  Psych:  No change in mood or affect. No depression or anxiety.  No memory loss.   Vital Signs BP 120/62 (BP Location: Left Arm, Cuff Size: Normal)   Pulse 67   Ht 6\' 2"  (1.88 m)   SpO2 95%   BMI 27.19 kg/m    Physical Exam:  General- No distress,  A&Ox3, pleasant ENT: No sinus tenderness, TM clear, pale nasal mucosa, no oral exudate,no post nasal drip, no LAN Cardiac: S1, S2, regular rate and rhythm, no murmur Chest: No wheeze/ rales/ dullness; no accessory muscle use, no nasal  flaring, no sternal retractions, basilar crackles Abd.: Soft Non-tender, ND, BS + Ext: No clubbing cyanosis, edema Neuro:  normal strength, MAE x 4, A&O x 3 Skin: No rashes, warm and dry Psych: normal mood and behavior   Assessment/Plan  ILD (interstitial lung disease) (HCC) Baseline Dyspnea Plan: We will see if we can do the 6 minute walk today. If we can't do it today we will reschedule it. We will do additional labs today which may  help [place] identify the cause of your pulmonary fibrosis. We will schedule you with Dr. Vaughan Browner in a 30 minute slot for follow up as soon as he has availability. Please contact office for sooner follow up if symptoms do not improve or worsen or seek emergency care    Pt. Is requesting a letter stating that his pulmonary fibrosis is more than likely related to his exposure to agent orange be sent to the [place].  Magdalen Spatz, NP 09/16/2017  5:13 PM

## 2017-09-16 NOTE — Patient Instructions (Addendum)
It is nice to meet you today. We will see if we can do the 6 minute walk today. If we can't do it today we will reschedule it. We will do additional labs today which may  help [place] identify the cause of your pulmonary fibrosis. We will schedule you with Dr. Vaughan Browner in a 30 minute slot for follow up as soon as he has availability. Please contact office for sooner follow up if symptoms do not improve or worsen or seek emergency care

## 2017-09-16 NOTE — Telephone Encounter (Signed)
We have attempted to contact the pt several times with no success or call back from the pt. Per triage protocol, message will be closed.  

## 2017-09-18 LAB — ANA,IFA RA DIAG PNL W/RFLX TIT/PATN
ANA: NEGATIVE
Rhuematoid fact SerPl-aCnc: <14 IU/mL (ref <14)

## 2017-09-18 LAB — JO-1 ANTIBODY-IGG: JO-1 AUTOABS: NEGATIVE AI

## 2017-09-18 LAB — ANCA SCREEN W REFLEX TITER: ANCA Screen: NEGATIVE

## 2017-09-18 LAB — SJOGREN'S SYNDROME ANTIBODS(SSA + SSB)
SSA (Ro) (ENA) Antibody, IgG: <1 AI
SSB (La) (ENA) Antibody, IgG: <1 AI

## 2017-09-20 LAB — HYPERSENSITIVITY PNEUMONITIS
A. PULLULANS ABS: NEGATIVE
A.Fumigatus #1 Abs: NEGATIVE
Micropolyspora faeni, IgG: NEGATIVE
PIGEON SERUM ABS: NEGATIVE
THERMOACTINOMYCES VULGARIS IGG: NEGATIVE
Thermoact. Saccharii: NEGATIVE

## 2017-10-09 ENCOUNTER — Ambulatory Visit (INDEPENDENT_AMBULATORY_CARE_PROVIDER_SITE_OTHER): Payer: No Typology Code available for payment source | Admitting: Pulmonary Disease

## 2017-10-09 ENCOUNTER — Other Ambulatory Visit (INDEPENDENT_AMBULATORY_CARE_PROVIDER_SITE_OTHER): Payer: No Typology Code available for payment source

## 2017-10-09 ENCOUNTER — Encounter: Payer: Self-pay | Admitting: Pulmonary Disease

## 2017-10-09 VITALS — BP 124/74 | HR 66 | Ht 74.0 in | Wt 209.0 lb

## 2017-10-09 DIAGNOSIS — J8410 Pulmonary fibrosis, unspecified: Secondary | ICD-10-CM

## 2017-10-09 LAB — HEPATIC FUNCTION PANEL
ALT: 25 U/L (ref 0–53)
AST: 23 U/L (ref 0–37)
Albumin: 4.2 g/dL (ref 3.5–5.2)
Alkaline Phosphatase: 82 U/L (ref 39–117)
BILIRUBIN DIRECT: 0.2 mg/dL (ref 0.0–0.3)
Total Bilirubin: 1 mg/dL (ref 0.2–1.2)
Total Protein: 7.1 g/dL (ref 6.0–8.3)

## 2017-10-09 MED ORDER — NINTEDANIB ESYLATE 150 MG PO CAPS: 150.0000 mg | ORAL_CAPSULE | Freq: Two times a day (BID) | ORAL | 11 refills | Status: DC

## 2017-10-09 NOTE — Progress Notes (Signed)
[name]    [id number]    Sep 09, 1945  Primary Care Physician:Green, Christean Grief, MD  Referring Physician: Levin Erp, [place] [place], [address], [place] [address]  Chief complaint: Follow-up for IPF  HPI: [name] is a 72 year old with past medical history of atrial fibrillation, coronary artery disease, tobacco use. He had a CT scan of the chest for evaluation of dyspnea which showed pulmonary fibrosis. He also had PFTs which showed restriction and diffusion defect concerning for interstitial lung disease. He has been sent here for further evaluation  His chief complaint is dyspnea on exertion, climbing status. He denies dyspnea at rest, cough, sputum production, wheezing. His only on aspirin and vitamin D. However he takes multiple over-the-counter naturals and herbals including protandim, B12, omega-3, turmeric among others.   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 He is exposed to [place] in [place] Smoking history: Half pack per day for 25 years. He still continues to smoke.  Interim history: He is here for review of his high-resolution CT States that his dyspnea is stable.  No new complaints today.  Outpatient Encounter Medications as of 10/09/2017  Medication Sig  . cholecalciferol (VITAMIN D) 1000 units tablet Take 1,000 Units by mouth daily.  . cyanocobalamin 500 MCG tablet Take 500 mcg by mouth daily.  . Omega 3 1000 MG CAPS Take by mouth.   No facility-administered encounter medications on file as of 10/09/2017.     Allergies as of 10/09/2017  . (No Known Allergies)    Past Medical History:  Diagnosis Date  . Arthritis   . Coronary artery calcification seen on CAT scan   . PAF (paroxysmal atrial fibrillation) ([place]) 08/02/2014  . PTSD (post-traumatic stress  disorder)    controlled    Past Surgical History:  Procedure Laterality Date  . BACK SURGERY     2000  . JOINT REPLACEMENT      left 2005  . SHOULDER ARTHROSCOPY W/ ROTATOR CUFF REPAIR  2012   right  . TOTAL KNEE ARTHROPLASTY  08/28/2011   Procedure: TOTAL KNEE ARTHROPLASTY;  Surgeon: Garald Balding, MD;  Location: [facility];  Service: Orthopedics;  Laterality: Right;    Family History  Problem Relation Age of Onset  . Lung cancer Mother   . Kidney cancer Father   . Heart attack Maternal Grandfather   . Alcoholism Brother   . HIV Brother   . Anesthesia problems Neg Hx   . Hypotension Neg Hx   . Malignant hyperthermia Neg Hx   . Pseudochol deficiency Neg Hx     Social History   Socioeconomic History  . Marital status: Married    Spouse name: Not on file  . Number of children: Not on file  . Years of education: Not on file  . Highest education level: Not on file  Occupational History  . Not on file  Social Needs  . Financial resource strain: Not on file  . Food insecurity:    Worry: Not on file    Inability: Not on file  . Transportation needs:    Medical: Not on file    Non-medical: Not on file  Tobacco Use  . Smoking status: Current Every Day Smoker    Packs/day: 0.50    Years: 20.00    Pack years: 10.00  Types: Cigarettes  . Smokeless tobacco: Never Used  Substance and Sexual Activity  . Alcohol use: No  . Drug use: No  . Sexual activity: Yes    Comment: Viagra at times  Lifestyle  . Physical activity:    Days per week: Not on file    Minutes per session: Not on file  . Stress: Not on file  Relationships  . Social connections:    Talks on phone: Not on file    Gets together: Not on file    Attends religious service: Not on file    Active member of club or organization: Not on file    Attends meetings of clubs or organizations: Not on file    Relationship status: Not on file  . Intimate partner violence:    Fear of current or ex partner: Not on  file    Emotionally abused: Not on file    Physically abused: Not on file    Forced sexual activity: Not on file  Other Topics Concern  . Not on file  Social History Narrative  . Not on file   Review of systems: Review of Systems  Constitutional: Negative for fever and chills.  HENT: Negative.   Eyes: Negative for blurred vision.  Respiratory: as per HPI  Cardiovascular: Negative for chest pain and palpitations.  Gastrointestinal: Negative for vomiting, diarrhea, blood per rectum. Genitourinary: Negative for dysuria, urgency, frequency and hematuria.  Musculoskeletal: Negative for myalgias, back pain and joint pain.  Skin: Negative for itching and rash.  Neurological: Negative for dizziness, tremors, focal weakness, seizures and loss of consciousness.  Endo/Heme/Allergies: Negative for environmental allergies.  Psychiatric/Behavioral: Negative for depression, suicidal ideas and hallucinations.  All other systems reviewed and are negative.  Physical Exam: Blood pressure 120/68, pulse 75, height 6\' 2"  (1.88 m), weight 211 lb 12.8 oz (96.1 kg), SpO2 96 %. Gen:      No acute distress HEENT:  EOMI, sclera anicteric Neck:     No masses; no thyromegaly Lungs:    Faint basal crackles CV:         Regular rate and rhythm; no murmurs Abd:      + bowel sounds; soft, non-tender; no palpable masses, no distension Ext:    No edema; adequate peripheral perfusion Skin:      Warm and dry; no rash Neuro: alert and oriented x 3 Psych: normal mood and affect  Data Reviewed: PFTs 09/27/16 FVC 4.08 (84%), FEV1 3.23 (90%), F/F 79, TLC 70%, DLCO 52% Mild restriction with moderate-severe DLCO impairment. Small airway disease with improvement in flow rates postbronchodilator  Imaging CT angio chest 08/21/16-no evidence of pulmonary emboli, mild atherosclerosis, coronary artery calcification. Retuicalr markings which have basilar predominant. No clear evidence of honeycombing. No consolidation, lung  infiltrate. This appears to have progressed slightly from 2016. I reviewed all images personally.  Im[facility] Chest x-ray 05/20/17 Chronic lung changes, bibasilar scarring  CT chest 05/20/17  Widespread subpleural reticulation greatest in the bases, groundglass opacities at the bases, sub pleural cystic changes in the upper aspect of the lung Scattered nodular opacities-10 mm in the right lower lobe, 6 mm the left lung apex, 7 mm in the right lung apex. Left adrenal adenoma.  High res CT 09/03/17 Basal predominant fibrosis, traction bronchiectasis.  No honeycombing Upper lobe paraseptal emphysema. I have reviewed the images personally.  6-minute walk test 10/03/17 390 m Starting heart rate, stats 78, 97% Ending heart rate, sats 103, 94%  Labs: Hypersensitivity  profile 10/04/16-negative Connective tissue serology 10/04/16-ANA, CCP, rheumatoid factor, CRP, aldolase-negative CK 443  Assessment:  Follow-up for interstitial lung disease, IPF CT scan shows basal predominant reticular opacities that is clearly progressive from 2016. 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 He does not have any significant exposures except for agent orange. There is no exposure to asbestos. He does not have signs and symptoms of connective tissue disease or autoimmune process. We had an extensive discussion with him regarding the possible etiologies of pulmonary fibrosis and the treatment options.  Prescribe Ofev  [facility]. Check hepatic function panel, myositis panel, SCL 70 Follow PFTs and 6MW   More then 1/2 the time of the 40 min visit was spent in counseling and/or coordination of care with the patient and family.  Plan/Recommendations: - Start Ofev. - Check hepatic function panel  Marshell Garfinkel MD [facility] 10/09/2017, 12:40 PM  CC: Levin Erp, MD

## 2017-10-09 NOTE — Patient Instructions (Addendum)
We will give a letter to the [place] regarding a diagnosis of pulmonary fibrosis, IPF We will check some blood test today in preparation for initiation of therapy including hepatic function panel, myositis panel, SCL 70. We will give a prescription to start ofev and send it to the [place] Follow-up in 1 month with repeat hepatic function panel.

## 2017-10-10 LAB — ANTI-SCLERODERMA ANTIBODY: SCLERODERMA (SCL-70) (ENA) ANTIBODY, IGG: NEGATIVE AI

## 2017-10-14 ENCOUNTER — Telehealth: Payer: Self-pay

## 2017-10-14 NOTE — Telephone Encounter (Signed)
-----   Message from Marshell Garfinkel, MD sent at 10/10/2017 10:47 AM EDT ----- He does not have recent PFTs. Please order spirometry and diffusion capacity

## 2017-10-14 NOTE — Telephone Encounter (Signed)
Pt has pending apt for PFT and OV on 11/12/17. Nothing further is needed.

## 2017-10-15 LAB — MYOSITIS PANEL III: RNP: 3.4 U/mL

## 2017-11-05 ENCOUNTER — Telehealth: Payer: Self-pay | Admitting: Pulmonary Disease

## 2017-11-05 NOTE — Telephone Encounter (Signed)
Letter has been placed in Dr. Matilde Bash folder for him to view.  Dr. Vaughan Browner is out of the office until 11/11/17. Called pt letting him know that the letter was placed in Dr. Matilde Bash folder but that he would not be back in the office until Monday, July 8.  Pt expressed understanding and stated he could wait to receive it back once Dr. Vaughan Browner was back.  Will route to [facility] for her to follow up on.

## 2017-11-12 ENCOUNTER — Ambulatory Visit: Payer: No Typology Code available for payment source | Admitting: Pulmonary Disease

## 2017-11-13 NOTE — Telephone Encounter (Signed)
Letter has been placed in Dr. Princella Ion for review.  Dr. Vaughan Browner has been made aware.

## 2017-11-19 ENCOUNTER — Encounter: Payer: Self-pay | Admitting: *Deleted

## 2017-11-19 ENCOUNTER — Ambulatory Visit (INDEPENDENT_AMBULATORY_CARE_PROVIDER_SITE_OTHER): Payer: No Typology Code available for payment source | Admitting: Pulmonary Disease

## 2017-11-19 ENCOUNTER — Telehealth: Payer: Self-pay | Admitting: Pulmonary Disease

## 2017-11-19 ENCOUNTER — Encounter: Payer: Self-pay | Admitting: Pulmonary Disease

## 2017-11-19 VITALS — BP 122/70 | HR 69 | Ht 74.0 in | Wt 213.0 lb

## 2017-11-19 DIAGNOSIS — J8410 Pulmonary fibrosis, unspecified: Secondary | ICD-10-CM | POA: Diagnosis not present

## 2017-11-19 DIAGNOSIS — Z5181 Encounter for therapeutic drug level monitoring: Secondary | ICD-10-CM | POA: Diagnosis not present

## 2017-11-19 NOTE — Telephone Encounter (Signed)
Pt was seen today, 7/16 at the office for a visit with Dr. Vaughan Browner.  After the visit, Dr. Vaughan Browner was reviewing recent labwork that was performed 10/09/17.  While looking at the Myositis Panel III, there was a question in regards to the results.  At the place where the results should be, it says that comment at the place where the result should be and we are not seeing a reference range.  Called Lab at [facility] and spoke with Lavella Lemons to see if she could help me out but Lavella Lemons as well as Cassie were confused with the way the results looked and they said to call [facility] and gave them our account number of [phone] and spoke with Danae Chen to see if she would be able to help me out with figuring out the results of the myosititis panel.  Per Danae Chen, she was going to have to wait for someone from the lab to return tomorrow to see if they could help interpret the results.  Will leave encounter open until we receive the call tomorrow, 7/17 from [place] to help interpret results.

## 2017-11-19 NOTE — Progress Notes (Signed)
Patient unable to perform and complete PFT 11/19/17

## 2017-11-19 NOTE — Patient Instructions (Addendum)
I am glad that you got the medication ofev, approved by the [facility] taking the meds as directed  I will see you back in clinic 1 month with liver tests

## 2017-11-19 NOTE — Progress Notes (Signed)
DAYNA GEURTS    [id number]    November 03, 1945  Primary Care Physician:Green, Christean Grief, MD  Referring Physician: Levin Erp, [address], [address] [place],  [address]  Chief complaint: Follow-up for IPF  HPI: Mr. Torrez is a 72 year old with past medical history of atrial fibrillation, coronary artery disease, tobacco use. He had a CT scan of the chest for evaluation of dyspnea which showed pulmonary fibrosis. He also had PFTs which showed restriction and diffusion defect concerning for interstitial lung disease. He has been sent here for further evaluation  His chief complaint is dyspnea on exertion, climbing status. He denies dyspnea at rest, cough, sputum production, wheezing. His only on aspirin and vitamin D. However he takes multiple over-the-counter naturals and herbals including protandim, B12, omega-3, turmeric among others.   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 He is exposed to [place] in [place] Smoking history: Half pack per day for 25 years. He still continues to smoke.  Interim history: Prescribed ofev.  He just got it approved through the [place] and is about to start the medication States that breathing is stable.  Outpatient Encounter Medications as of 11/19/2017  Medication Sig  . cholecalciferol (VITAMIN D) 1000 units tablet Take 1,000 Units by mouth daily.  . cyanocobalamin 500 MCG tablet Take 500 mcg by mouth daily.  . Nintedanib (OFEV) 150 MG CAPS Take 1 capsule (150 mg total) by mouth 2 (two) times daily.  . Omega 3 1000 MG CAPS Take by mouth.   No facility-administered encounter medications on file as of 11/19/2017.     Allergies as of 11/19/2017  . (No Known Allergies)    Past Medical History:  Diagnosis Date  . Arthritis   . Coronary artery calcification  seen on CAT scan   . PAF (paroxysmal atrial fibrillation) ([place]) 08/02/2014  . PTSD (post-traumatic stress disorder)    controlled    Past Surgical History:  Procedure Laterality Date  . BACK SURGERY     2000  . JOINT REPLACEMENT      left 2005  . SHOULDER ARTHROSCOPY W/ ROTATOR CUFF REPAIR  2012   right  . TOTAL KNEE ARTHROPLASTY  08/28/2011   Procedure: TOTAL KNEE ARTHROPLASTY;  Surgeon: Garald Balding, MD;  Location: [facility];  Service: Orthopedics;  Laterality: Right;    Family History  Problem Relation Age of Onset  . Lung cancer Mother   . Kidney cancer Father   . Heart attack Maternal Grandfather   . Alcoholism Brother   . HIV Brother   . Anesthesia problems Neg Hx   . Hypotension Neg Hx   . Malignant hyperthermia Neg Hx   . Pseudochol deficiency Neg Hx     Social History   Socioeconomic History  . Marital status: Married    Spouse name: Not on file  . Number of children: Not on file  . Years of education: Not on file  . Highest education level: Not on file  Occupational History  . Not on file  Social Needs  . Financial resource strain: Not on file  . Food insecurity:    Worry: Not on file    Inability: Not on file  . Transportation needs:    Medical: Not on file    Non-medical: Not on file  Tobacco Use  .  Smoking status: Current Every Day Smoker    Packs/day: 0.50    Years: 20.00    Pack years: 10.00    Types: Cigarettes  . Smokeless tobacco: Never Used  Substance and Sexual Activity  . Alcohol use: No  . Drug use: No  . Sexual activity: Yes    Comment: Viagra at times  Lifestyle  . Physical activity:    Days per week: Not on file    Minutes per session: Not on file  . Stress: Not on file  Relationships  . Social connections:    Talks on phone: Not on file    Gets together: Not on file    Attends religious service: Not on file    Active member of club or organization: Not on file    Attends meetings of clubs or organizations: Not on file     Relationship status: Not on file  . Intimate partner violence:    Fear of current or ex partner: Not on file    Emotionally abused: Not on file    Physically abused: Not on file    Forced sexual activity: Not on file  Other Topics Concern  . Not on file  Social History Narrative  . Not on file   Review of systems: Review of Systems  Constitutional: Negative for fever and chills.  HENT: Negative.   Eyes: Negative for blurred vision.  Respiratory: as per HPI  Cardiovascular: Negative for chest pain and palpitations.  Gastrointestinal: Negative for vomiting, diarrhea, blood per rectum. Genitourinary: Negative for dysuria, urgency, frequency and hematuria.  Musculoskeletal: Negative for myalgias, back pain and joint pain.  Skin: Negative for itching and rash.  Neurological: Negative for dizziness, tremors, focal weakness, seizures and loss of consciousness.  Endo/Heme/Allergies: Negative for environmental allergies.  Psychiatric/Behavioral: Negative for depression, suicidal ideas and hallucinations.  All other systems reviewed and are negative.  Physical Exam: Blood pressure 122/70, pulse 69, height 6\' 2"  (1.88 m), weight 213 lb (96.6 kg), SpO2 97 %. Gen:      No acute distress HEENT:  EOMI, sclera anicteric Neck:     No masses; no thyromegaly Lungs:    Basal crackles. CV:         Regular rate and rhythm; no murmurs Abd:      + bowel sounds; soft, non-tender; no palpable masses, no distension Ext:    No edema; adequate peripheral perfusion Skin:      Warm and dry; no rash Neuro: alert and oriented x 3 Psych: normal mood and affect  Data Reviewed: PFTs 09/27/16 FVC 4.08 (84%), FEV1 3.23 (90%), F/F 79, TLC 70%, DLCO 52% Mild restriction with moderate-severe DLCO impairment. Small airway disease with improvement in flow rates postbronchodilator  Imaging CT angio chest 08/21/16-no evidence of pulmonary emboli, mild atherosclerosis, coronary artery calcification. Retuicalr  markings which have basilar predominant. No clear evidence of honeycombing. No consolidation, lung infiltrate. This appears to have progressed slightly from 2016. I reviewed all images personally.  Imaging from the [facility] Chest x-ray 05/20/17 Chronic lung changes, bibasilar scarring  CT chest 05/20/17  Widespread subpleural reticulation greatest in the bases, groundglass opacities at the bases, sub pleural cystic changes in the upper aspect of the lung Scattered nodular opacities-10 mm in the right lower lobe, 6 mm the left lung apex, 7 mm in the right lung apex. Left adrenal adenoma.  High res CT 09/03/17 Basal predominant fibrosis, traction bronchiectasis.  No honeycombing Upper lobe paraseptal emphysema. I have reviewed the images personally.  6-minute walk test 10/03/17 390 m Starting heart rate, stats 78, 97% Ending heart rate, sats 103, 94%  PFTs-unable to complete in spite of multiple attempts.  Labs: Hypersensitivity profile 10/04/16-negative Connective tissue serology 10/04/16-ANA, CCP, rheumatoid factor, CRP, aldolase-negative CK 443 SCL70, Jo1 10/09/17-negative  Assessment:  Follow-up for interstitial lung disease, IPF CT scan shows basal predominant reticular opacities that is clearly progressive from 2016. 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 He does not have any significant exposures except for agent orange. There is no exposure to asbestos. He does not have signs and symptoms of connective tissue disease or autoimmune process. We had an extensive discussion with him regarding the possible etiologies of pulmonary fibrosis and the treatment options.  Prescribed ofev.  This has been approved by the [place] and he is about to start the medication He is unable to complete PFTs in spite of multiple attempts We will continue to monitor him with a 6-minute walk and CT of the chest in 3 to 4 months.   Plan/Recommendations: - Start Ofev. - Check hepatic function panel in 1  month  Marshell Garfinkel MD [facility] 11/19/2017, 1:28 PM  CC: Levin Erp, MD

## 2017-11-21 NOTE — Telephone Encounter (Signed)
Letter has been written and given to pt at 11/19/17 OV. Nothing further is needed.

## 2017-11-25 NOTE — Telephone Encounter (Signed)
Due to not hearing anything about the labwork, called [facility] again.  Spoke with [place] who stated Danae Chen had put a call out to [facility] who was supposed to call us about the labwork.  jenna stated she would call them again regarding the labwork. Phone number if we needed to call [facility] is [phone]  While on hold waiting for Jenna to get back on the phone, was hung up on.  Called [facility] and spoke with Anderson Malta who stated to me Dr. Boris Lown is the one who will call to verify the results but was out of the office today and would be back in the office tomorrow, 7/23.  Once Dr. Boris Lown reviews the [facility] he would call our office.

## 2017-11-26 NOTE — Telephone Encounter (Signed)
Spoke with Anderson Malta at [facility] lab and she will give Dr Boris Lown a message to call us back.

## 2017-11-26 NOTE — Telephone Encounter (Signed)
Dr. Boris Lown is returning call. Cb is [phone].

## 2017-11-26 NOTE — Telephone Encounter (Signed)
ATC number  - Correct numner is [phone].  ATC this number and voicemail was full - Will call back

## 2017-12-10 NOTE — Telephone Encounter (Signed)
Discussed with Dr. Boris Lown who clarified that myositis is negative. Nothing further needed

## 2017-12-20 ENCOUNTER — Telehealth: Payer: Self-pay | Admitting: Pulmonary Disease

## 2017-12-20 ENCOUNTER — Other Ambulatory Visit (INDEPENDENT_AMBULATORY_CARE_PROVIDER_SITE_OTHER): Payer: No Typology Code available for payment source

## 2017-12-20 ENCOUNTER — Encounter: Payer: Self-pay | Admitting: Pulmonary Disease

## 2017-12-20 ENCOUNTER — Ambulatory Visit (INDEPENDENT_AMBULATORY_CARE_PROVIDER_SITE_OTHER): Payer: No Typology Code available for payment source | Admitting: Pulmonary Disease

## 2017-12-20 VITALS — BP 120/70 | HR 76 | Ht 70.0 in | Wt 209.0 lb

## 2017-12-20 DIAGNOSIS — J8410 Pulmonary fibrosis, unspecified: Secondary | ICD-10-CM

## 2017-12-20 DIAGNOSIS — Z5181 Encounter for therapeutic drug level monitoring: Secondary | ICD-10-CM | POA: Diagnosis not present

## 2017-12-20 LAB — HEPATIC FUNCTION PANEL
ALBUMIN: 4 g/dL (ref 3.5–5.2)
ALT: 21 U/L (ref 0–53)
AST: 20 U/L (ref 0–37)
Alkaline Phosphatase: 70 U/L (ref 39–117)
BILIRUBIN DIRECT: 0.2 mg/dL (ref 0.0–0.3)
TOTAL PROTEIN: 6.8 g/dL (ref 6.0–8.3)
Total Bilirubin: 1.1 mg/dL (ref 0.2–1.2)

## 2017-12-20 NOTE — Patient Instructions (Addendum)
I am glad your breathing is stable Continue ofev You can get in touch with our interstitial lung disease support group at the email- [email or web address] We will check a hepatic function panel today for monitoring Follow-up in 1 to 2 months.

## 2017-12-20 NOTE — Progress Notes (Signed)
[name]    [id number]    [birth year]  Primary Care Physician:Green, Christean Grief, MD  Referring Physician: Levin Erp, [address] [place], [address] [place], [place] [address]  Chief complaint: Follow-up for IPF Started ofev July 2019  HPI: [name] is a 72 year old with past medical history of atrial fibrillation, coronary artery disease, tobacco use. He had a CT scan of the chest for evaluation of dyspnea which showed pulmonary fibrosis. He also had PFTs which showed restriction and diffusion defect concerning for interstitial lung disease. He has been sent here for further evaluation  His chief complaint is dyspnea on exertion, climbing status. He denies dyspnea at rest, cough, sputum production, wheezing. His only on aspirin and vitamin D. However he takes multiple over-the-counter naturals and herbals including protandim, B12, omega-3, turmeric among others.   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 He is exposed to [place] in [place] Smoking history: Half pack per day for 25 years. He still continues to smoke.  Interim history: Started ofev.  He is tolerating it well except for occasional diarrhea and indigestion States that breathing is stable.  Outpatient Encounter Medications as of 12/20/2017  Medication Sig  . cholecalciferol (VITAMIN D) 1000 units tablet Take 1,000 Units by mouth daily.  . cyanocobalamin 500 MCG tablet Take 500 mcg by mouth daily.  . Nintedanib (OFEV) 150 MG CAPS Take 1 capsule (150 mg total) by mouth 2 (two) times daily.  . Omega 3 1000 MG CAPS Take by mouth.   No facility-administered encounter medications on file as of 12/20/2017.     Allergies as of 12/20/2017  . (No Known Allergies)    Past Medical History:  Diagnosis Date  . Arthritis   . Coronary artery  calcification seen on CAT scan   . PAF (paroxysmal atrial fibrillation) ([place]) 08/02/2014  . PTSD (post-traumatic stress disorder)    controlled    Past Surgical History:  Procedure Laterality Date  . BACK SURGERY     2000  . JOINT REPLACEMENT      left 2005  . SHOULDER ARTHROSCOPY W/ ROTATOR CUFF REPAIR  2012   right  . TOTAL KNEE ARTHROPLASTY  08/28/2011   Procedure: TOTAL KNEE ARTHROPLASTY;  Surgeon: Garald Balding, MD;  Location: [facility];  Service: Orthopedics;  Laterality: Right;    Family History  Problem Relation Age of Onset  . Lung cancer Mother   . Kidney cancer Father   . Heart attack Maternal Grandfather   . Alcoholism Brother   . HIV Brother   . Anesthesia problems Neg Hx   . Hypotension Neg Hx   . Malignant hyperthermia Neg Hx   . Pseudochol deficiency Neg Hx     Social History   Socioeconomic History  . Marital status: Married    Spouse name: Not on file  . Number of children: Not on file  . Years of education: Not on file  . Highest education level: Not on file  Occupational History  . Not on file  Social Needs  . Financial resource strain: Not on file  . Food insecurity:    Worry: Not on file    Inability: Not on file  . Transportation needs:    Medical: Not on file    Non-medical: Not on file  Tobacco Use  .  Smoking status: Current Every Day Smoker    Packs/day: 0.50    Years: 20.00    Pack years: 10.00    Types: Cigarettes  . Smokeless tobacco: Never Used  Substance and Sexual Activity  . Alcohol use: No  . Drug use: No  . Sexual activity: Yes    Comment: Viagra at times  Lifestyle  . Physical activity:    Days per week: Not on file    Minutes per session: Not on file  . Stress: Not on file  Relationships  . Social connections:    Talks on phone: Not on file    Gets together: Not on file    Attends religious service: Not on file    Active member of club or organization: Not on file    Attends meetings of clubs or organizations:  Not on file    Relationship status: Not on file  . Intimate partner violence:    Fear of current or ex partner: Not on file    Emotionally abused: Not on file    Physically abused: Not on file    Forced sexual activity: Not on file  Other Topics Concern  . Not on file  Social History Narrative  . Not on file   Review of systems: Review of Systems  Constitutional: Negative for fever and chills.  HENT: Negative.   Eyes: Negative for blurred vision.  Respiratory: as per HPI  Cardiovascular: Negative for chest pain and palpitations.  Gastrointestinal: Negative for vomiting, diarrhea, blood per rectum. Genitourinary: Negative for dysuria, urgency, frequency and hematuria.  Musculoskeletal: Negative for myalgias, back pain and joint pain.  Skin: Negative for itching and rash.  Neurological: Negative for dizziness, tremors, focal weakness, seizures and loss of consciousness.  Endo/Heme/Allergies: Negative for environmental allergies.  Psychiatric/Behavioral: Negative for depression, suicidal ideas and hallucinations.  All other systems reviewed and are negative.  Physical Exam: Blood pressure 120/70, pulse 76, height 5\' 10"  (1.778 m), weight 209 lb (94.8 kg), SpO2 96 %. Gen:      No acute distress HEENT:  EOMI, sclera anicteric Neck:     No masses; no thyromegaly Lungs:    Clear to auscultation bilaterally; normal respiratory effort CV:         Regular rate and rhythm; no murmurs Abd:      + bowel sounds; soft, non-tender; no palpable masses, no distension Ext:    No edema; adequate peripheral perfusion Skin:      Warm and dry; no rash Neuro: alert and oriented x 3 Psych: normal mood and affect  Data Reviewed: PFTs 09/27/16 FVC 4.08 (84%), FEV1 3.23 (90%), F/F 79, TLC 70%, DLCO 52% Mild restriction with moderate-severe DLCO impairment. Small airway disease with improvement in flow rates postbronchodilator  Imaging CT angio chest 08/21/16-no evidence of pulmonary emboli, mild  atherosclerosis, coronary artery calcification. Retuicalr markings which have basilar predominant. No clear evidence of honeycombing. No consolidation, lung infiltrate. This appears to have progressed slightly from 2016. I reviewed all images personally.  Imaging [facility] Chest x-ray 05/20/17 Chronic lung changes, bibasilar scarring  CT chest 05/20/17  Widespread subpleural reticulation greatest in the bases, groundglass opacities at the bases, sub pleural cystic changes in the upper aspect of the lung Scattered nodular opacities-10 mm in the right lower lobe, 6 mm the left lung apex, 7 mm in the right lung apex. Left adrenal adenoma.  High res CT 09/03/17 Basal predominant fibrosis, traction bronchiectasis.  No honeycombing Upper lobe paraseptal emphysema. I  have reviewed the images personally.  6-minute walk test 10/03/17 390 m Starting heart rate, stats 78, 97% Ending heart rate, sats 103, 94%  PFTs-unable to complete in spite of multiple attempts.  Labs: Hypersensitivity profile 10/04/16-negative Connective tissue serology 10/04/16-ANA, CCP, rheumatoid factor, CRP, aldolase-negative CK 443 SCL70, Jo1 10/09/17-negative  Assessment:  Follow-up for interstitial lung disease, IPF CT scan shows basal predominant reticular opacities that is clearly progressive from 2016. 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 He does not have any significant exposures except for agent orange. There is no exposure to asbestos. He does not have signs and symptoms of connective tissue disease or autoimmune process. We had an extensive discussion with him regarding the possible etiologies of pulmonary fibrosis and the treatment options.  Started on Ofev through the [place].  He is tolerating it well except for occasional diarrhea and indigestion He is unable to complete PFTs in spite of multiple attempts We will continue to monitor him with a 6-minute walk and CT of the chest in 3 to 4 months.     Plan/Recommendations: - Continue Ofev - Check hepatic function panel.  Marshell Garfinkel MD [facility] 12/20/2017, 1:57 PM  CC: Levin Erp, MD

## 2017-12-20 NOTE — Telephone Encounter (Signed)
Called and spoke with patient regarding results.  Informed the patient of results and recommendations today. Pt verbalized understanding and denied any questions or concerns at this time.  Nothing further needed.  

## 2017-12-25 LAB — PULMONARY FUNCTION TEST
FEF 25-75 Pre: 1.48 L/sec
FEF2575-%Pred-Pre: 56 %
FEV1-%PRED-PRE: 76 %
FEV1-PRE: 2.68 L
FEV1FVC-%Pred-Pre: 92 %
FEV6-%PRED-PRE: 86 %
FEV6-Pre: 3.93 L
FEV6FVC-%Pred-Pre: 105 %
FVC-%PRED-PRE: 82 %
FVC-Pre: 3.96 L
PRE FEV1/FVC RATIO: 68 %
Pre FEV6/FVC Ratio: 99 %

## 2018-01-29 ENCOUNTER — Telehealth: Payer: Self-pay | Admitting: Pulmonary Disease

## 2018-01-29 NOTE — Telephone Encounter (Signed)
Left detailed pt to make him aware that ILD questionnaire will be mailed to address of file.  I have requested that he complete forms and bring them to his upcoming OV. Nothing further is needed.

## 2018-02-03 ENCOUNTER — Encounter: Payer: Self-pay | Admitting: Pulmonary Disease

## 2018-02-03 ENCOUNTER — Other Ambulatory Visit (INDEPENDENT_AMBULATORY_CARE_PROVIDER_SITE_OTHER): Payer: No Typology Code available for payment source

## 2018-02-03 ENCOUNTER — Ambulatory Visit (INDEPENDENT_AMBULATORY_CARE_PROVIDER_SITE_OTHER): Payer: No Typology Code available for payment source | Admitting: Pulmonary Disease

## 2018-02-03 VITALS — BP 130/60 | HR 76 | Ht 70.0 in | Wt 210.8 lb

## 2018-02-03 DIAGNOSIS — J8410 Pulmonary fibrosis, unspecified: Secondary | ICD-10-CM

## 2018-02-03 DIAGNOSIS — Z5181 Encounter for therapeutic drug level monitoring: Secondary | ICD-10-CM

## 2018-02-03 LAB — HEPATIC FUNCTION PANEL
ALT: 18 U/L (ref 0–53)
AST: 19 U/L (ref 0–37)
Albumin: 4 g/dL (ref 3.5–5.2)
Alkaline Phosphatase: 66 U/L (ref 39–117)
BILIRUBIN DIRECT: 0.2 mg/dL (ref 0.0–0.3)
BILIRUBIN TOTAL: 1.1 mg/dL (ref 0.2–1.2)
Total Protein: 6.9 g/dL (ref 6.0–8.3)

## 2018-02-03 NOTE — Progress Notes (Signed)
[name]    [id number]    14-Dec-1945  Primary Care Physician:Leblanc, Bobby Grief, MD  Referring Physician: Levin Leblanc, [name], [address] [place], [place] [address]  Chief complaint: Follow-up for IPF Started ofev July 2019  HPI: [name] is a 72 year old with past medical history of atrial fibrillation, coronary artery disease, tobacco use. He had a CT scan of the chest for evaluation of dyspnea which showed pulmonary fibrosis. He also had PFTs which showed restriction and diffusion defect concerning for interstitial lung disease. He has been sent here for further evaluation  His chief complaint is dyspnea on exertion, climbing status. He denies dyspnea at rest, cough, sputum production, wheezing. His only on aspirin and vitamin D. However he takes multiple over-the-counter naturals and herbals including protandim, B12, omega-3, turmeric among others.   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 He is exposed to [place] in [place] Smoking history: Half pack per day for 25 years. He still continues to smoke. ILD questionnaire 02/03/18-has a humidifier at home otherwise negative for exposures, occupational history, medication history.  Interim history: Continues on Ofev.  Has occasional diarrhea and indigestion Breathing is stable.  Outpatient Encounter Medications as of 02/03/2018  Medication Sig  . cholecalciferol (VITAMIN D) 1000 units tablet Take 1,000 Units by mouth daily.  . cyanocobalamin 500 MCG tablet Take 500 mcg by mouth daily.  . Nintedanib (OFEV) 150 MG CAPS Take 1 capsule (150 mg total) by mouth 2 (two) times daily.  . Omega 3 1000 MG CAPS Take by mouth.   No facility-administered encounter medications on file as of 02/03/2018.    Physical Exam: Blood pressure 130/60, pulse  76, height 5\' 10"  (1.778 m), weight 210 lb 12.8 oz (95.6 kg), SpO2 95 %. Gen:      No acute distress HEENT:  EOMI, sclera anicteric Neck:     No masses; no thyromegaly Lungs:    Clear to auscultation bilaterally; normal respiratory effort CV:         Regular rate and rhythm; no murmurs Abd:      + bowel sounds; soft, non-tender; no palpable masses, no distension Ext:    No edema; adequate peripheral perfusion Skin:      Warm and dry; no rash Neuro: alert and oriented x 3 Psych: normal mood and affect  Data Reviewed: PFTs 09/27/16 FVC 4.08 (84%), FEV1 3.23 (90%), F/F 79, TLC 70%, DLCO 52% Mild restriction with moderate-severe DLCO impairment. Small airway disease with improvement in flow rates postbronchodilator  Imaging CT angio chest 08/21/16-no evidence of pulmonary emboli, mild atherosclerosis, coronary artery calcification. Retuicalr markings which have basilar predominant. No clear evidence of honeycombing. No consolidation, lung infiltrate. This appears to have progressed slightly from 2016. I reviewed all images personally.  Imaging from the [facility] Chest x-ray 05/20/17 Chronic lung changes, bibasilar scarring  CT chest 05/20/17  Widespread subpleural reticulation greatest in the bases, groundglass opacities at the bases, sub pleural cystic changes in the upper aspect of the lung Scattered nodular opacities-10 mm in the right lower lobe, 6 mm the left lung apex, 7 mm in the right lung apex. Left adrenal adenoma.  High res CT 09/03/17 Basal predominant fibrosis, traction bronchiectasis.  No honeycombing Upper lobe paraseptal emphysema. I have reviewed the images personally.  6-minute walk test 10/03/17 390  m Starting heart rate, stats 78, 97% Ending heart rate, sats 103, 94%  PFTs-unable to complete in spite of multiple attempts.  Labs: Hypersensitivity profile 10/04/16-negative Connective tissue serology 10/04/16-ANA, CCP, rheumatoid factor, CRP, aldolase-negative CK  443 SCL70, Jo1 10/09/17-negative  Assessment:  Follow-up for interstitial lung disease, IPF CT scan shows basal predominant reticular opacities that is clearly progressive from 2016. 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 He does not have any significant exposures except for agent orange. There is no exposure to asbestos. He does not have signs and symptoms of connective tissue disease or autoimmune process. We had an extensive discussion with him regarding the possible etiologies of pulmonary fibrosis and the treatment options.  Started on Ofev through the [place].  He is tolerating it well except for occasional diarrhea and indigestion He is unable to complete PFTs in spite of multiple attempts   Plan/Recommendations: - Continue Ofev - Check hepatic function panel.  Bobby Garfinkel MD [facility] 02/03/2018, 2:31 PM  CC: Bobby Erp, MD

## 2018-02-03 NOTE — Patient Instructions (Signed)
Continue Ofev We will check hepatic function panel today  Follow-up in 2 months.

## 2018-03-05 ENCOUNTER — Telehealth: Payer: Self-pay | Admitting: Pulmonary Disease

## 2018-03-05 MED ORDER — BENZONATATE 200 MG PO CAPS: 200.0000 mg | ORAL_CAPSULE | Freq: Two times a day (BID) | ORAL | 0 refills | Status: DC | PRN

## 2018-03-05 NOTE — Telephone Encounter (Signed)
Call in Tessalon 200 mg twice daily as needed for cough He can use Delsym over-the-counter as well.

## 2018-03-05 NOTE — Telephone Encounter (Signed)
Called and spoke with patient. He states he is having a "hacking" cough. He is frustrated at work because after 1-2 sentences he starts coughing. No sore throat, no fever, no fatigure, no SOB. Patient does state he has a runny nose at times and sneezes when coughing.   Dr. Vaughan Browner please advise.

## 2018-03-05 NOTE — Telephone Encounter (Signed)
Spoke with the pt and notified of recs per Dr Vaughan Browner  He states he needs the rx sent to the [place] in [place]  I have faxed it to [phone]

## 2018-04-08 ENCOUNTER — Ambulatory Visit: Payer: No Typology Code available for payment source | Admitting: Pulmonary Disease

## 2018-04-10 ENCOUNTER — Ambulatory Visit (INDEPENDENT_AMBULATORY_CARE_PROVIDER_SITE_OTHER): Payer: No Typology Code available for payment source | Admitting: Pulmonary Disease

## 2018-04-10 ENCOUNTER — Encounter: Payer: Self-pay | Admitting: Pulmonary Disease

## 2018-04-10 VITALS — BP 128/78 | HR 81 | Ht 70.0 in | Wt 210.0 lb

## 2018-04-10 DIAGNOSIS — J84112 Idiopathic pulmonary fibrosis: Secondary | ICD-10-CM | POA: Diagnosis not present

## 2018-04-10 DIAGNOSIS — J849 Interstitial pulmonary disease, unspecified: Secondary | ICD-10-CM | POA: Diagnosis not present

## 2018-04-10 DIAGNOSIS — Z5181 Encounter for therapeutic drug level monitoring: Secondary | ICD-10-CM

## 2018-04-10 LAB — HEPATIC FUNCTION PANEL
ALT: 20 U/L (ref 0–53)
AST: 21 U/L (ref 0–37)
Albumin: 4.2 g/dL (ref 3.5–5.2)
Alkaline Phosphatase: 63 U/L (ref 39–117)
BILIRUBIN DIRECT: 0.2 mg/dL (ref 0.0–0.3)
BILIRUBIN TOTAL: 1 mg/dL (ref 0.2–1.2)
Total Protein: 7.1 g/dL (ref 6.0–8.3)

## 2018-04-10 NOTE — Progress Notes (Signed)
[name]    [id number]    [birth year]  Primary Care Physician:Green, Christean Grief, MD  Referring Physician: Levin Erp, [place] [address], [address], [place] [address]  Chief complaint: Follow-up for IPF Started ofev July 2019  HPI: [name] is a 72 year old with past medical history of atrial fibrillation, coronary artery disease, tobacco use. He had a CT scan of the chest for evaluation of dyspnea which showed pulmonary fibrosis. He also had PFTs which showed restriction and diffusion defect concerning for interstitial lung disease. He has been sent here for further evaluation  His chief complaint is dyspnea on exertion, climbing status. He denies dyspnea at rest, cough, sputum production, wheezing. His only on aspirin and vitamin D. However he takes multiple over-the-counter naturals and herbals including protandim, B12, omega-3, turmeric among others.   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 He is exposed to [place] in [place] Smoking history: Half pack per day for 25 years. He still continues to smoke. ILD questionnaire 02/03/18-has a humidifier at home otherwise negative for exposures, occupational history, medication history.  Interim history: Continues on Ofev.  Has occasional diarrhea and indigestion Breathing is stable.  Outpatient Encounter Medications as of 04/10/2018  Medication Sig  . benzonatate (TESSALON) 200 MG capsule Take 1 capsule (200 mg total) by mouth 2 (two) times daily as needed for cough.  . cholecalciferol (VITAMIN D) 1000 units tablet Take 1,000 Units by mouth daily.  . cyanocobalamin 500 MCG tablet Take 500 mcg by mouth daily.  . Nintedanib (OFEV) 150 MG CAPS Take 1 capsule (150 mg total) by mouth 2 (two) times daily.  . Omega 3 1000 MG CAPS Take by mouth.    No facility-administered encounter medications on file as of 04/10/2018.    Physical Exam: Blood pressure 130/60, pulse 76, height 5\' 10"  (1.778 m), weight 210 lb 12.8 oz (95.6 kg), SpO2 95 %. Gen:      No acute distress HEENT:  EOMI, sclera anicteric Neck:     No masses; no thyromegaly Lungs:    Clear to auscultation bilaterally; normal respiratory effort CV:         Regular rate and rhythm; no murmurs Abd:      + bowel sounds; soft, non-tender; no palpable masses, no distension Ext:    No edema; adequate peripheral perfusion Skin:      Warm and dry; no rash Neuro: alert and oriented x 3 Psych: normal mood and affect  Data Reviewed: PFTs 09/27/16 FVC 4.08 (84%), FEV1 3.23 (90%), F/F 79, TLC 70%, DLCO 52% Mild restriction with moderate-severe DLCO impairment. Small airway disease with improvement in flow rates postbronchodilator  Imaging CT angio chest 08/21/16-no evidence of pulmonary emboli, mild atherosclerosis, coronary artery calcification. Retuicalr markings which have basilar predominant. No clear evidence of honeycombing. No consolidation, lung infiltrate. This appears to have progressed slightly from 2016. I reviewed all images personally.  Imaging from the [facility] Chest x-ray 05/20/17 Chronic lung changes, bibasilar scarring  CT chest 05/20/17  Widespread subpleural reticulation greatest in the bases, groundglass opacities at the bases, sub pleural cystic changes in the upper aspect of the lung Scattered nodular opacities-10 mm in the right lower lobe, 6 mm the left lung apex, 7 mm in the right lung apex. Left adrenal adenoma.  High res CT 09/03/17 Basal  predominant fibrosis, traction bronchiectasis.  No honeycombing Upper lobe paraseptal emphysema. I have reviewed the images personally.  6-minute walk test 10/03/17 390 m Starting heart rate, stats 78, 97% Ending heart rate, sats 103, 94%  PFTs-unable to complete in spite of multiple attempts.  Labs: Hypersensitivity  profile 10/04/16-negative Connective tissue serology 10/04/16-ANA, CCP, rheumatoid factor, CRP, aldolase-negative CK 443 SCL70, Jo1 10/09/17-negative  Assessment:  Follow-up for interstitial lung disease, IPF CT scan shows basal predominant reticular opacities that is clearly progressive from 2016. 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 He does not have any significant exposures except for agent orange. There is no exposure to asbestos. He does not have signs and symptoms of connective tissue disease or autoimmune process. We had an extensive discussion with him regarding the possible etiologies of pulmonary fibrosis and the treatment options.  Started on Ofev through the [place].  He is tolerating it well except for occasional diarrhea and indigestion He is unable to complete PFTs in spite of multiple attempts   Plan/Recommendations: - Continue Ofev - Check hepatic function panel.  Marshell Garfinkel MD [facility] 04/10/2018, 2:06 PM  CC: Levin Erp, MD

## 2018-04-10 NOTE — Addendum Note (Signed)
Addended by: Suzzanne Cloud E on: 04/10/2018 02:24 PM   Modules accepted: Orders

## 2018-04-10 NOTE — Addendum Note (Signed)
Addended by: Maryanna Shape A on: 04/10/2018 02:23 PM   Modules accepted: Orders

## 2018-04-10 NOTE — Patient Instructions (Signed)
I am glad you are doing well with the medication for pulmonary fibrosis We will check hepatic function panel today We will schedule you for 6-minute walk test high-resolution CT in April 2020.  Follow-up in clinic after these tests.

## 2018-04-10 NOTE — Progress Notes (Signed)
[name]    [id number]    1945-07-02  Primary Care Physician:Green, Christean Grief, MD  Referring Physician: Levin Erp, [place] Leblanc, [name] [id number]  Chief complaint: Follow-up for IPF Started ofev July 2019  HPI: [name] is a 72 year old with past medical history of atrial fibrillation, coronary artery disease, tobacco use. He had a CT scan of the chest for evaluation of dyspnea which showed pulmonary fibrosis. He also had PFTs which showed restriction and diffusion defect concerning for interstitial lung disease. He has been sent here for further evaluation  His chief complaint is dyspnea on exertion, climbing status. He denies dyspnea at rest, cough, sputum production, wheezing. His only on aspirin and vitamin D. However he takes multiple over-the-counter naturals and herbals including protandim, B12, omega-3, turmeric among others.   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 He is exposed to [place] in [place] Smoking history: Half pack per day for 25 years. He still continues to smoke. ILD questionnaire 02/03/18-has a humidifier at home otherwise negative for exposures, occupational history, medication history.  Interim history: Continues on ofev.  States that breathing is stable He had a nagging nonproductive cough a few months earlier but has resolved with Delsym and Tessalon.  Outpatient Encounter Medications as of 04/10/2018  Medication Sig  . benzonatate (TESSALON) 200 MG capsule Take 1 capsule (200 mg total) by mouth 2 (two) times daily as needed for cough.  . cholecalciferol (VITAMIN D) 1000 units tablet Take 1,000 Units by mouth daily.  . cyanocobalamin 500 MCG tablet Take 500 mcg by mouth daily.  . Nintedanib (OFEV) 150 MG CAPS Take 1 capsule (150 mg total)  by mouth 2 (two) times daily.  . Omega 3 1000 MG CAPS Take by mouth.   No facility-administered encounter medications on file as of 04/10/2018.    Physical Exam: Blood pressure 128/78, pulse 81, height 5\' 10"  (1.778 m), weight 210 lb (95.3 kg), SpO2 94 %. Gen:      No acute distress HEENT:  EOMI, sclera anicteric Neck:     No masses; no thyromegaly Lungs:    Basal crackles. CV:         Regular rate and rhythm; no murmurs Abd:      + bowel sounds; soft, non-tender; no palpable masses, no distension Ext:    No edema; adequate peripheral perfusion Skin:      Warm and dry; no rash Neuro: alert and oriented x 3 Psych: normal mood and affect  Data Reviewed: Imaging CT angio chest 08/21/16-no evidence of pulmonary emboli, mild atherosclerosis, coronary artery calcification. Retuicalar markings which have basilar predominant. No clear evidence of honeycombing. No consolidation, lung infiltrate. This appears to have progressed slightly from 2016.  High res CT 09/03/17 Basal predominant fibrosis, traction bronchiectasis.  No honeycombing Upper lobe paraseptal emphysema. I have reviewed the images personally.  PFTs 09/27/16 FVC 4.08 (84%), FEV1 3.23 (90%), F/F 79, TLC 70%, DLCO 52% Mild restriction with moderate-severe DLCO impairment. Small airway disease with improvement in flow rates postbronchodilator  Unable to complete repeat PFTs in spite of multiple attempts.  6-minute walk test 10/03/17 390 m Starting heart rate, stats 78, 97% Ending heart rate, sats 103, 94%  Outside records Imaging from the [place] Chest x-ray 05/20/17 Chronic lung changes, bibasilar scarring  CT  chest 05/20/17  Widespread subpleural reticulation greatest in the bases, groundglass opacities at the bases, sub pleural cystic changes in the upper aspect of the lung Scattered nodular opacities-10 mm in the right lower lobe, 6 mm the left lung apex, 7 mm in the right lung apex. Left adrenal  adenoma.  Labs: Hypersensitivity profile 10/04/16-negative Connective tissue serology 10/04/16-ANA, CCP, rheumatoid factor, CRP, aldolase-negative CK 443 SCL70, Jo1 10/09/17-negative  Assessment:  Follow-up for interstitial lung disease, IPF CT scan shows basal predominant reticular opacities that is clearly progressive from 2016. 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 He does not have any significant exposures except for agent orange. There is no exposure to asbestos. He does not have signs and symptoms of connective tissue disease or autoimmune process. We had an extensive discussion with him regarding the possible etiologies of pulmonary fibrosis and the treatment options.  Started on Ofev through the [place].  He is tolerating it well except for occasional diarrhea and indigestion He is unable to complete PFTs in spite of multiple attempts  Recheck hepatic function panel Follow-up high-resolution CT, 6-minute walk test in April 2020   Plan/Recommendations: - Continue Ofev - Check hepatic function panel. - Follow-up high-resolution CT, 6-minute walk test.  Bobby Garfinkel MD  Pulmonary and Critical Care 04/10/2018, 2:11 PM  CC: Bobby Erp, MD

## 2018-08-04 ENCOUNTER — Ambulatory Visit (INDEPENDENT_AMBULATORY_CARE_PROVIDER_SITE_OTHER): Payer: Medicare HMO | Admitting: Acute Care

## 2018-08-04 ENCOUNTER — Other Ambulatory Visit: Payer: Self-pay

## 2018-08-04 ENCOUNTER — Encounter: Payer: Self-pay | Admitting: Acute Care

## 2018-08-04 DIAGNOSIS — Z5181 Encounter for therapeutic drug level monitoring: Secondary | ICD-10-CM

## 2018-08-04 NOTE — Patient Instructions (Addendum)
It was good to talk with you today. Patient instructions:  We will send pre-authorization paperwork to the [place] / your insurance company to request additional coverage of appointments with Dr. Theda Sers Pulmonary  for IPF management. Continue OFEV 150 mg twice daily as you have been doing. We will place an order for hepatic function blood work to ensure you are tolerating the [place] well.  Come into our lab today or tomorrow for the blood draw. We will call you with the results. Follow up appointment October 08, 2018 at 2 pm   Please call us  if you need anything prior to the scheduled  June visit. We are always available for acute needs. Please contact office for sooner follow up if symptoms do not improve or worsen or seek emergency care  Work on quitting smoking if you still smoke.   Follow Up Instructions: Follow up in October 08, 2018 for labs and OV with Judson Roch NP.    Coronavirus (COVID-19) Are you at risk?  Are you at risk for the Coronavirus (COVID-19)?  To be considered HIGH RISK for Coronavirus (COVID-19), you have to meet the following criteria:  . Traveled to [place], [place], [place], [place] or [place]; or in the [place] to [place], [place], [place], or [place]; and have fever, cough, and shortness of breath within the last 2 weeks of travel OR . Been in close contact with a person diagnosed with COVID-19 within the last 2 weeks and have fever, cough, and shortness of breath . IF YOU DO NOT MEET THESE CRITERIA, YOU ARE CONSIDERED LOW RISK FOR COVID-19.  What to do if you are HIGH RISK for COVID-19?  [name] If you are having a medical emergency, call 911. . Seek medical care right away. Before you go to a doctor's office, urgent care or emergency department, call ahead and tell them about your recent travel, contact with someone diagnosed with COVID-19, and your symptoms. You should receive instructions from your physician's office regarding next steps of care.  . When  you arrive at healthcare provider, tell the healthcare staff immediately you have returned from visiting [place], [place], [place], [place] or [place]; or traveled in the [place] to [place], [place], [place], or [place]; in the last two weeks or you have been in close contact with a person diagnosed with COVID-19 in the last 2 weeks.   . Tell the health care staff about your symptoms: fever, cough and shortness of breath. . After you have been seen by a medical provider, you will be either: o Tested for (COVID-19) and discharged home on quarantine except to seek medical care if symptoms worsen, and asked to  - Stay home and avoid contact with others until you get your results (4-5 days)  - Avoid travel on public transportation if possible (such as bus, train, or airplane) or o Sent to the Emergency Department by EMS for evaluation, COVID-19 testing, and possible admission depending on your condition and test results.  What to do if you are LOW RISK for COVID-19?  Reduce your risk of any infection by using the same precautions used for avoiding the common cold or flu:  [name] Wash your hands often with soap and warm water for at least 20 seconds.  If soap and water are not readily available, use an alcohol-based hand sanitizer with at least 60% alcohol.  . If coughing or sneezing, cover your mouth and nose by coughing or sneezing into  the elbow areas of your shirt or coat, into a tissue or into your sleeve (not your hands). . Avoid shaking hands with others and consider head nods or verbal greetings only. . Avoid touching your eyes, nose, or mouth with unwashed hands.  . Avoid close contact with people who are sick. . Avoid places or events with large numbers of people in one location, like concerts or sporting events. . Carefully consider travel plans you have or are making. . If you are planning any travel outside or inside the [place], visit the CDC's Travelers' Health webpage for the latest  health notices. . If you have some symptoms but not all symptoms, continue to monitor at home and seek medical attention if your symptoms worsen. . If you are having a medical emergency, call 911.   [place] / e-Visit: [email or web address]         [facility]: [facility]: [phone]                   [facility]: [phone]

## 2018-08-04 NOTE — Progress Notes (Signed)
Virtual Visit via Telephone Note  I connected with [name] on 08/04/18 at  2:00 PM EDT by telephone and verified that I am speaking with the correct person using two identifiers.   I discussed the limitations, risks, security and privacy concerns of performing an evaluation and management service by telephone and the availability of in person appointments. I also discussed with the patient that there may be a patient responsible charge related to this service. The patient expressed understanding and agreed to proceed.  I  confirmed date of birth and address to authenticate  Identity. My nurse Quentin Ore reviewed medications and ordered any refills required.  Imaging Data Reviewed: PFTs 09/27/16 FVC 4.08 (84%), FEV1 3.23 (90%), F/F 79, TLC 70%, DLCO 52% Mild restriction with moderate-severe DLCO impairment. Small airway disease with improvement in flow rates postbronchodilator CT angio chest 08/21/16-no evidence of pulmonary emboli, mild atherosclerosis, coronary artery calcification. Retuicalr markings which have basilar predominant. No clear evidence of honeycombing. No consolidation, lung infiltrate. This appears to have progressed slightly from 2016.   Imaging from the [facility] Chest x-ray 05/20/17 Chronic lung changes, bibasilar scarring  CT chest 05/20/17  Widespread subpleural reticulation greatest in the bases, groundglass opacities at the bases, sub pleural cystic changes in the upper aspect of the lung Scattered nodular opacities-10 mm in the right lower lobe, 6 mm the left lung apex, 7 mm in the right lung apex. Left adrenal adenoma.  High res CT 09/03/17 Basal predominant fibrosis, traction bronchiectasis.  No honeycombing Upper lobe paraseptal emphysema. I have reviewed the images personally.  6-minute walk test 10/03/17 390 m Starting heart rate, stats 78, 97% Ending heart rate, sats 103, 94%  PFTs-unable to complete in spite of multiple  attempts.  Labs: Hypersensitivity profile 10/04/16-negative Connective tissue serology 10/04/16-ANA, CCP, rheumatoid factor, CRP, aldolase-negative CK 443 SCL70, Jo1 10/09/17-negative     History of Present Illness: Pt. Was last seen by Dr. Vaughan Browner 04/10/2018. He is a 73 year old current every day smoker with past medical history of atrial fibrillation, coronary artery disease, tobacco use. He had a CT scan of the chest for evaluation of dyspnea which showed pulmonary fibrosis.This has been noted to have progressed since 2016. He also had PFTs which showed restriction and diffusion defect concerning for interstitial lung disease. 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  He was started on Ofev through the [place].  He is tolerating it well except for occasional diarrhea and indigestion   Pt. Presents for tele visit. He states his cough is actually a bit better. Pt states he is doing well on his OFEV. This was started through the [place]. Pt. Needs documentation for his insurance for continuation of care through Dr. Chase Caller. We will initiate that prior authorization paperwork today for additional visits.  He states he has adequate  OFEV through the [place]. He states his cough is nagging but he feels it is better. It is a non-productive cough. He had a cold 3 weeks ago, he was treated with a z pack and pred taper. His symptoms resolved with the exception of the cough. The cough is non-productive. He denies any fever, chest pain, orthopnea or hemoptysis. He does not use oxygen.He denies sore throat.    Observations/Objective: Stable IPF after recent flare / cold 3 weeks ago. Prescribed Z pack and Pred taper per his PCP with improvement of symptoms Improving cough Tolerating OFEV Last Hepatic Panel was 04/10/2018 Concerned about getting continued coverage from [facility] for appointments with [facility]  for management of his IPF.  Assessment and Plan: Improvement in flare after treatment with z pack and  pred taper by PCP Plan: We will send pre-authorization paperwork to the [place] / your insurance company to request additional coverage of appointments with Dr. Theda Sers Pulmonary  for IPF management. Continue OFEV 150 mg twice daily as you have been doing. We will place an order for hepatic function blood work to ensure you are tolerating the [place] well.  Come into our lab today or tomorrow for the blood draw. We will call you with the results. Follow up appointment October 08, 2018 at 2 pm   Please call us  if you need anything prior to the scheduled  June visit. We are always available for acute needs. Please contact office for sooner follow up if symptoms do not improve or worsen or seek emergency care  Work on quitting smoking if you still smoke.   Follow Up Instructions: Follow up in October 08, 2018 for labs and OV with Judson Roch NP.   I discussed the assessment and treatment plan with the patient. The patient was provided an opportunity to ask questions and all were answered. The patient agreed with the plan and demonstrated an understanding of the instructions.   The patient was advised to call back or seek an in-person evaluation if the symptoms worsen or if the condition fails to improve as anticipated.  I provided 23 minutes of non-face-to-face time during this encounter.   Magdalen Spatz, NP 08/04/2018 3:13 PM

## 2018-08-07 ENCOUNTER — Other Ambulatory Visit (INDEPENDENT_AMBULATORY_CARE_PROVIDER_SITE_OTHER): Payer: No Typology Code available for payment source

## 2018-08-07 DIAGNOSIS — Z5181 Encounter for therapeutic drug level monitoring: Secondary | ICD-10-CM | POA: Diagnosis not present

## 2018-08-08 ENCOUNTER — Inpatient Hospital Stay: Admission: RE | Admit: 2018-08-08 | Payer: No Typology Code available for payment source | Source: Ambulatory Visit

## 2018-08-08 LAB — HEPATIC FUNCTION PANEL
ALT: 28 U/L (ref 0–53)
AST: 26 U/L (ref 0–37)
Albumin: 4.1 g/dL (ref 3.5–5.2)
Alkaline Phosphatase: 71 U/L (ref 39–117)
Bilirubin, Direct: 0.1 mg/dL (ref 0.0–0.3)
Total Bilirubin: 0.8 mg/dL (ref 0.2–1.2)
Total Protein: 6.9 g/dL (ref 6.0–8.3)

## 2018-08-11 ENCOUNTER — Other Ambulatory Visit: Payer: Self-pay | Admitting: Acute Care

## 2018-08-11 DIAGNOSIS — Z5181 Encounter for therapeutic drug level monitoring: Secondary | ICD-10-CM

## 2018-08-11 NOTE — Progress Notes (Signed)
Order for hepatic function panel placed per Eric Form, NP for therapeutic drug monitoring.

## 2018-08-13 ENCOUNTER — Other Ambulatory Visit: Payer: No Typology Code available for payment source

## 2018-08-18 ENCOUNTER — Ambulatory Visit: Payer: No Typology Code available for payment source | Admitting: Pulmonary Disease

## 2018-08-18 ENCOUNTER — Ambulatory Visit: Payer: No Typology Code available for payment source

## 2018-08-31 IMAGING — CT CT ANGIO CHEST
2 of 8 series · 19 of 46 positions shown · IV contrast (ISOVUE 370)
Comparison: Radiography 02/01/2015.  CT 07/20/2014.

CLINICAL DATA: Intermittent shortness of breath.

EXAM:
CT ANGIOGRAPHY CHEST WITH CONTRAST
TECHNIQUE: Multidetector CT imaging of the chest was performed using the
standard protocol during bolus administration of intravenous
contrast. Multiplanar CT image reconstructions and MIPs were
obtained to evaluate the vascular anatomy.
CONTRAST:  80 cc Isovue 370

[Series 5: thins · axial · 0.81mm/px · z∈[-401,-85]mm · 16 of 356 slices shown]
[im 20/356  lung]
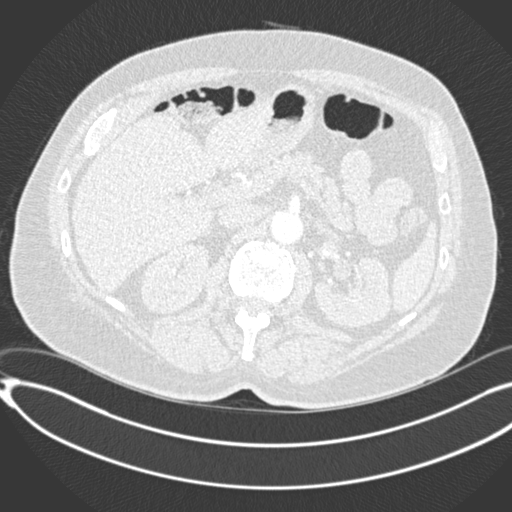
[im 40/356  soft-tissue]
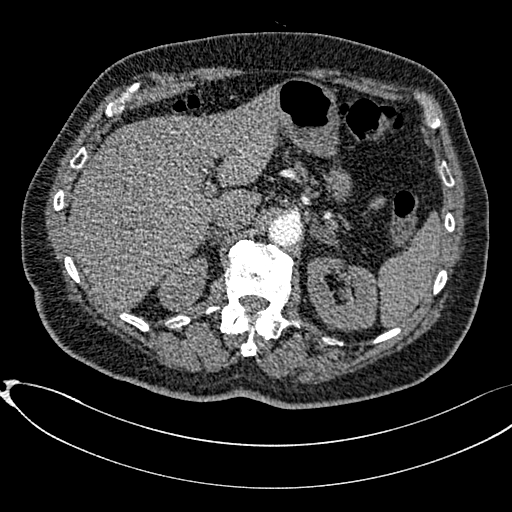
[im 60/356  lung]
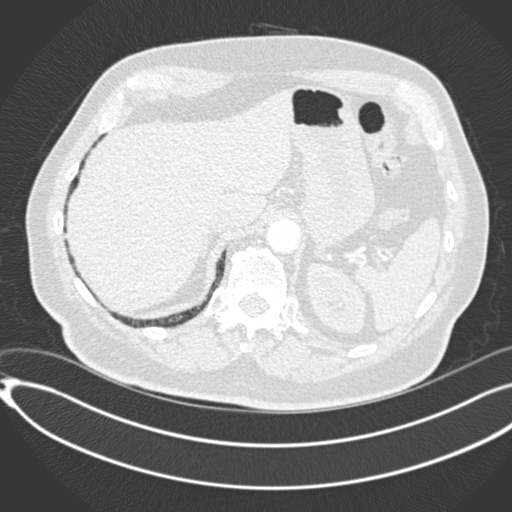
[im 79/356  soft-tissue]
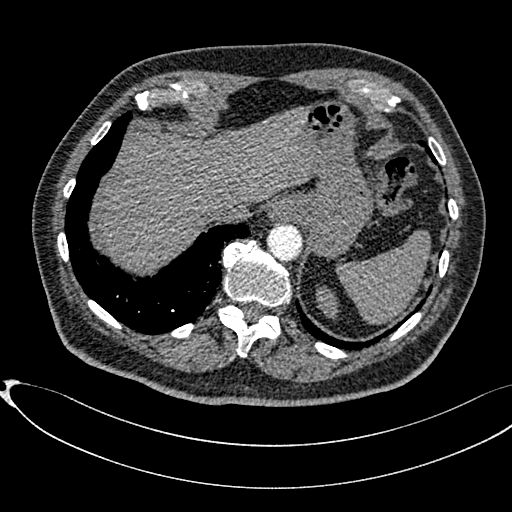
[im 99/356  lung]
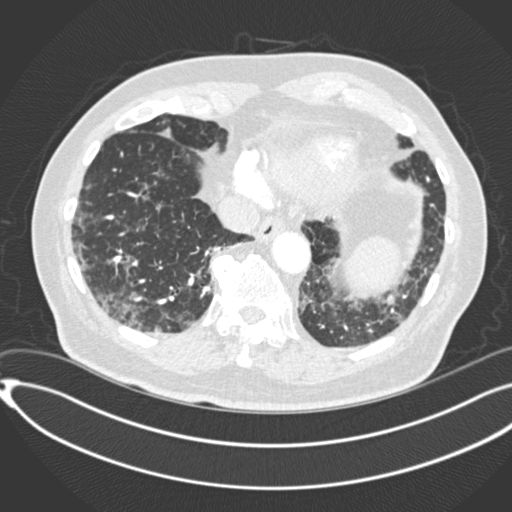
[im 119/356  soft-tissue]
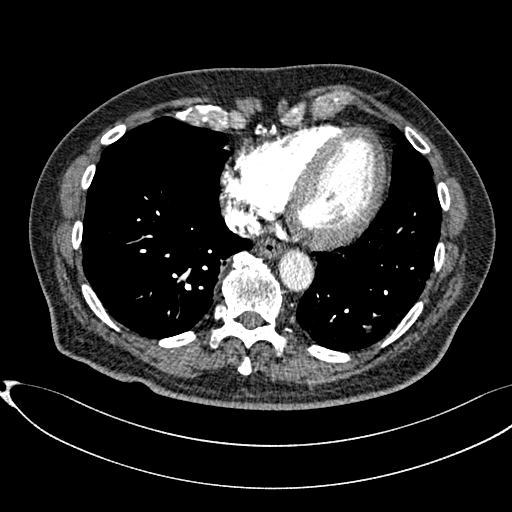
[im 139/356  lung]
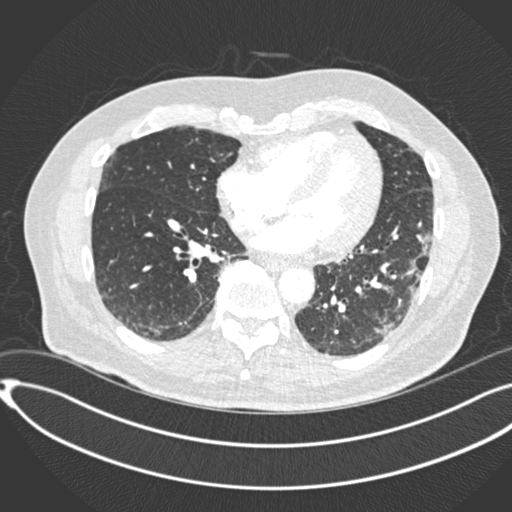
[im 158/356  soft-tissue]
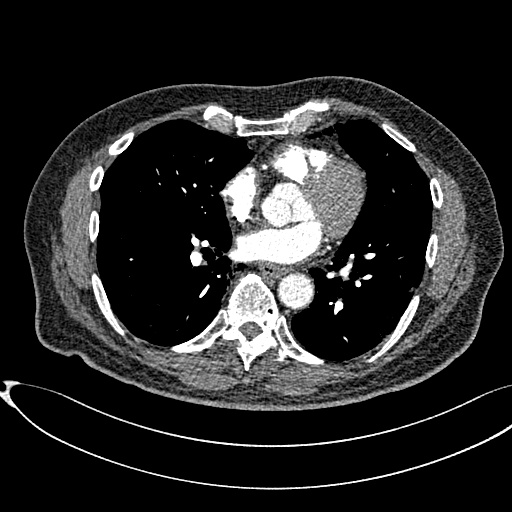
[im 198/356  lung]
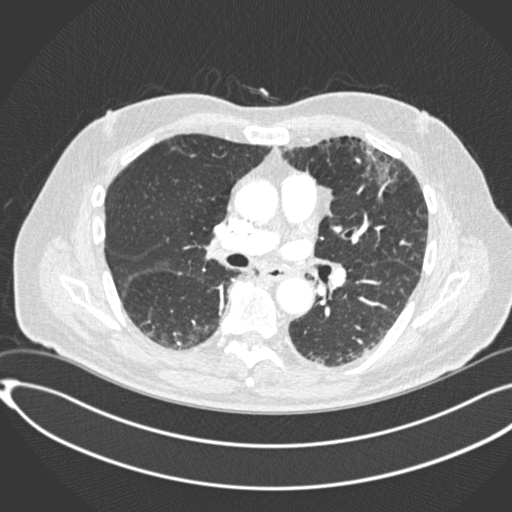
[im 217/356  soft-tissue]
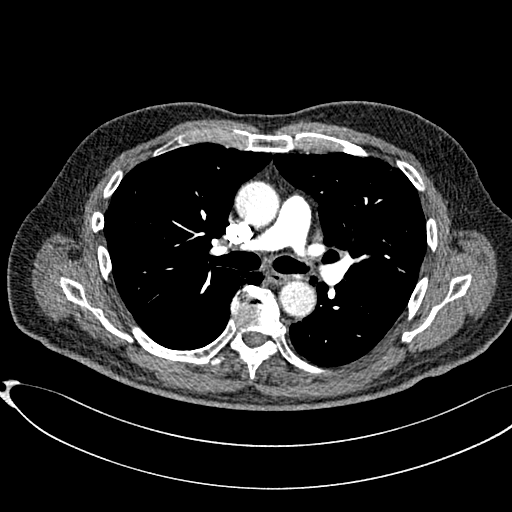
[im 237/356  lung]
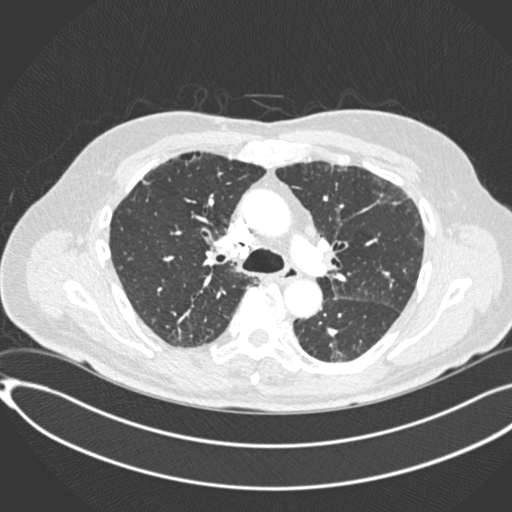
[im 257/356  soft-tissue]
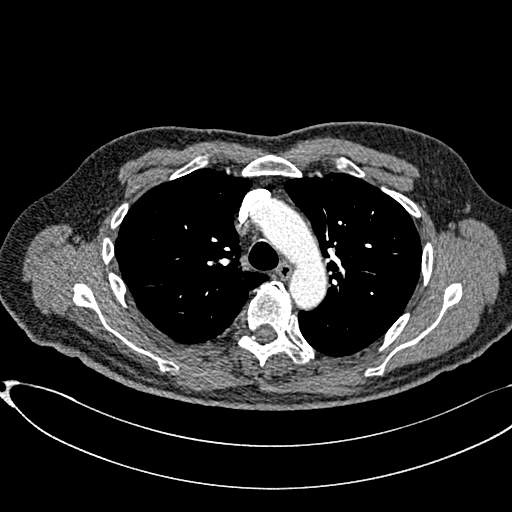
[im 277/356  lung]
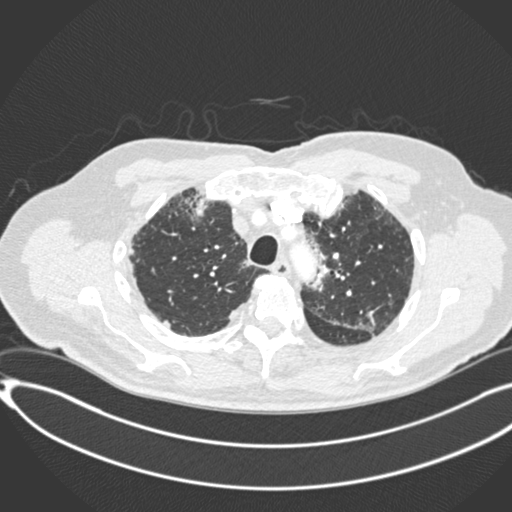
[im 296/356  soft-tissue]
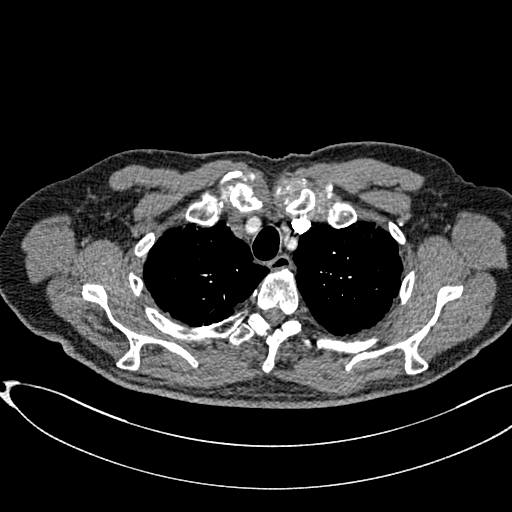
[im 316/356  lung]
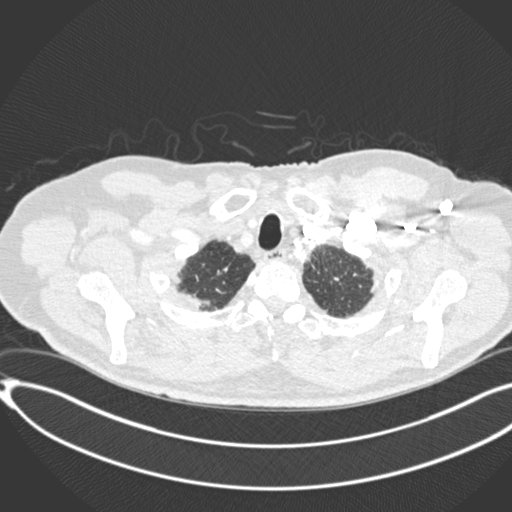
[im 336/356  soft-tissue]
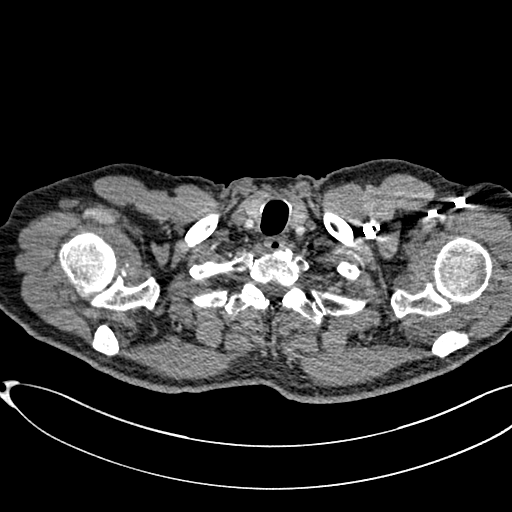

[Series 7: coronal mpr · coronal · 0.70mm/px · 3 of 148 slices shown]
[im 37/148  soft-tissue]
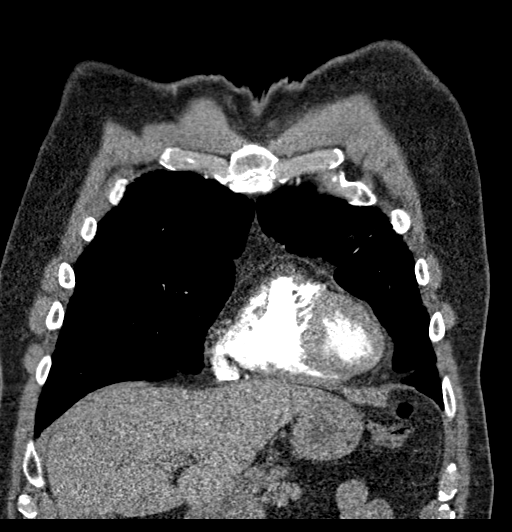
[im 74/148  soft-tissue]
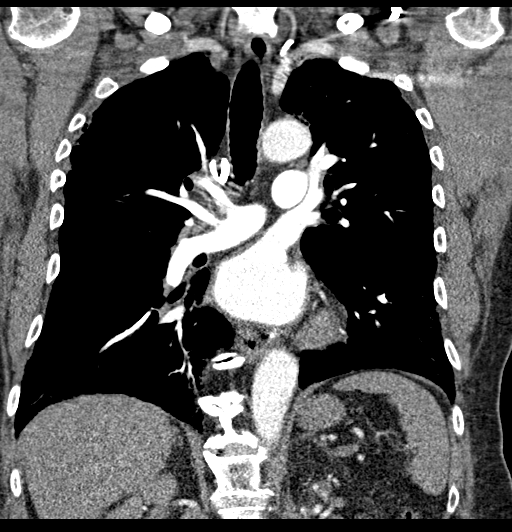
[im 111/148  soft-tissue]
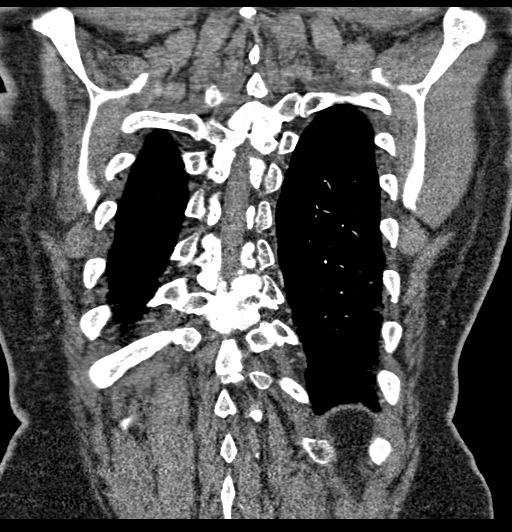

[19 of 46 positions shown; findings below may reference images not displayed]

FINDINGS: Cardiovascular: Pulmonary arterial opacification is good. There are
no pulmonary emboli. There is mild aortic atherosclerosis. There is
some coronary artery calcification, not particularly prominent. No
pericardial effusion. Heart size is normal.

Mediastinum/Nodes: No mediastinal or hilar mass or lymphadenopathy.

Lungs/Pleura: Pattern of subpleural markings with particular
abnormality, basilar predominant. Pattern of disease suggest
possible diagnosis of UIP. No evidence of mass lesion. No active
consolidation or collapse. No pleural fluid. Findings are slightly
progressive since 9719.

Upper Abdomen: No acute finding. Nonobstructing 3 mm stone upper
pole left kidney.

Musculoskeletal: Ordinary spinal curvature in degenerative changes.

Review of the MIP images confirms the above findings.
IMPRESSION: No evidence of pulmonary emboli.

Mild aortic atherosclerosis and mild coronary artery calcification.

Pulmonary findings suggesting the diagnosis of usual interstitial
pneumonia. No evidence of acute process.

## 2018-09-23 ENCOUNTER — Telehealth: Payer: Self-pay | Admitting: *Deleted

## 2018-09-23 NOTE — Telephone Encounter (Signed)
Verified patient, confirmed appointment, and completed screening for COVID 19.  COVID-19 Pre-Screening Questions:  In the past 7 to 10 days have you had a cough, shortness of breath, headache, congestion, fever, body aches, chills, sore throat, or sudden loss of taste or sense of smell? no  Have you been around anyone with known Covid 19. no  Have you been around anyone who is awaiting Covid 19 test results in the past 7 to 10 days? No  Have you been around anyone who has been exposed to Covid 19, or has mentioned symptoms of Covid 19 within the past 7 to 10 days? no If you have any concerns about symptoms your patients report please contact your leadership team, or the provider the patient is seeing in the office for further guidance.

## 2018-09-24 ENCOUNTER — Other Ambulatory Visit: Payer: Self-pay

## 2018-09-24 ENCOUNTER — Ambulatory Visit (INDEPENDENT_AMBULATORY_CARE_PROVIDER_SITE_OTHER)
Admission: RE | Admit: 2018-09-24 | Discharge: 2018-09-24 | Disposition: A | Discharge status: Home / Self Care | Payer: No Typology Code available for payment source | Source: Ambulatory Visit | Attending: Pulmonary Disease | Admitting: Pulmonary Disease

## 2018-09-24 DIAGNOSIS — J849 Interstitial pulmonary disease, unspecified: Secondary | ICD-10-CM | POA: Diagnosis not present

## 2018-10-08 ENCOUNTER — Encounter: Payer: Self-pay | Admitting: Acute Care

## 2018-10-08 ENCOUNTER — Ambulatory Visit: Payer: No Typology Code available for payment source | Admitting: Acute Care

## 2018-10-08 ENCOUNTER — Other Ambulatory Visit: Payer: Self-pay

## 2018-10-08 ENCOUNTER — Ambulatory Visit (INDEPENDENT_AMBULATORY_CARE_PROVIDER_SITE_OTHER): Payer: No Typology Code available for payment source | Admitting: Acute Care

## 2018-10-08 DIAGNOSIS — Z720 Tobacco use: Secondary | ICD-10-CM | POA: Diagnosis not present

## 2018-10-08 DIAGNOSIS — J849 Interstitial pulmonary disease, unspecified: Secondary | ICD-10-CM

## 2018-10-08 NOTE — Progress Notes (Signed)
History of Present Illness [name] is a 73 y.o. male with past medical history of atrial fibrillation, coronary artery disease, tobacco use. He had a CT scan of the chest for evaluation of dyspnea which showed pulmonary fibrosis. He also had PFTs which showed restriction and diffusion defect concerning for interstitial lung disease. He has been sent here for further evaluation  His chief complaint is dyspnea on exertion, climbing status. He denies dyspnea at rest, cough, sputum production, wheezing. His only on aspirin and vitamin D. However he takes multiple over-the-counter naturals and herbals including protandim, B12, omega-3, turmeric among others.   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 He is exposed to [place] in [place] Smoking history: Half pack per day for 25 years. He still continues to smoke. ILD questionnaire 02/03/18-has a humidifier at home otherwise negative for exposures, occupational history, medication history.  10/08/2018 6 month follow up for ILD, IPF Pt. States she is doing well. He continues to play tennis and work. He is working [employer] during  [employer]. He walked from downtown [place] to [address] without any problems. He did get exposed to some tear gas  He states she is unable to wear his mask due to PTSD. He is also not social distancing.  We reviewed that he is at risk. He has a lot of faith that he will be ok. He is compliant with his OFEV. He is having no issues with the medications. No GI issues.LFT's that were checked 4/20202 are normal.He states he has no significant issues with cough, and that his sputum is clear. He is continuing to smoke. He denies fever, chest pain, orthopnea or hemoptysis.  Test Results: HRCT  Lungs/Pleura: There is a  redemonstrated pattern of moderate pulmonary fibrosis with an apical to basal gradient, featuring mild traction bronchiectasis, some areas of peripheral bronchiolectasis, irregular ground-glass opacity, and no definite honeycombing. These findings are not significantly changed compared to prior CT dated 09/03/2017, and are very minimally progressed on sequential CTs dating back to 07/20/2014. No pleural effusion or pneumothorax.  There is a redemonstrated pattern of moderate pulmonary fibrosis with an apical to basal gradient, featuring mild traction bronchiectasis, some areas of peripheral bronchiolectasis, irregular ground-glass opacity, and no definite honeycombing. These findings are not significantly changed compared to prior CT dated 09/03/2017, and are very minimally progressed on sequential CTs dating back to 07/20/2014. 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 Small airway disease with improvement in flow rates postbronchodilator CT angio chest 08/21/16-no evidence of pulmonary emboli, mild atherosclerosis, coronary artery calcification. Retuicalr markings which have basilar predominant. No clear evidence of honeycombing. No consolidation, lung infiltrate. This appears to have progressed slightly from 2016.   Imaging from [facility] Chest x-ray 05/20/17 Chronic lung changes, bibasilar scarring  CT chest 05/20/17  Widespread subpleural reticulation greatest in the bases, groundglass opacities at the bases, sub pleural cystic changes in the upper aspect of the lung Scattered nodular opacities-10 mm in the right lower lobe, 6 mm the left lung apex, 7 mm in the right lung apex. Left adrenal adenoma.  High res CT 09/03/17 Basal predominant fibrosis, traction bronchiectasis. No honeycombing Upper  lobe  paraseptal emphysema. I have reviewed the images personally.  6-minute walk test 10/03/17 390 m Starting heart rate, stats 78, 97% Ending heart rate, sats 103, 94%  PFTs-unable to complete in spite of multiple attempts.  Labs: Hypersensitivity profile 10/04/16-negative Connective tissue serology 10/04/16-ANA, CCP, rheumatoid factor, CRP, aldolase-negative CK 443 SCL70, Jo1 10/09/17-negative  CBC Latest Ref Rng & Units 07/22/2014 07/21/2014 07/20/2014  WBC 4.0 - 10.5 K/uL 8.2 7.9 12.6(H)  Hemoglobin 13.0 - 17.0 g/dL 13.3 13.3 14.9  Hematocrit 39.0 - 52.0 % 42.0 41.2 46.0  Platelets 150 - 400 K/uL 160 153 202    BMP Latest Ref Rng & Units 08/14/2016 07/22/2014 07/21/2014  Glucose 65 - 99 mg/dL 114(H) 106(H) 148(H)  BUN 8 - 27 mg/dL 16 11 14   Creatinine 0.76 - 1.27 mg/dL 0.96 0.70 0.75  BUN/Creat Ratio 10 - 24 17 - -  Sodium 134 - 144 mmol/L 140 138 136  Potassium 3.5 - 5.2 mmol/L 4.2 4.1 4.1  Chloride 96 - 106 mmol/L 100 108 105  CO2 18 - 29 mmol/L 27 22 23   Calcium 8.6 - 10.2 mg/dL 10.1 9.6 9.8    BNP    Component Value Date/Time   BNP 93.7 07/20/2014 2215    ProBNP No results found for: PROBNP  PFT    Component Value Date/Time   FEV1PRE 2.68 11/19/2017 1205   FEV1POST 3.23 09/27/2016 1249   FVCPRE 3.96 11/19/2017 1205   FVCPOST 4.08 09/27/2016 1249   DLCOUNC 18.62 09/27/2016 1249   PREFEV1FVCRT 68 11/19/2017 1205   PSTFEV1FVCRT 79 09/27/2016 1249    Ct Chest High Resolution  Result Date: 09/25/2018 CLINICAL DATA:  Pulmonary fibrosis EXAM: CT CHEST WITHOUT CONTRAST TECHNIQUE: Multidetector CT imaging of the chest was performed following the standard protocol without intravenous contrast. High resolution imaging of the lungs, as well as inspiratory and expiratory imaging, was performed. COMPARISON:  CT chest, 09/03/2017, 08/21/2016, 07/20/2014 FINDINGS: Cardiovascular: Scattered coronary artery calcifications. Normal heart size. No pericardial effusion.  Mediastinum/Nodes: No enlarged mediastinal, hilar, or axillary lymph nodes. Thyroid gland, trachea, and esophagus demonstrate no significant findings. Lungs/Pleura: There is a redemonstrated pattern of moderate pulmonary fibrosis with an apical to basal gradient, featuring mild traction bronchiectasis, some areas of peripheral bronchiolectasis, irregular ground-glass opacity, and no definite honeycombing. These findings are not significantly changed compared to prior CT dated 09/03/2017, and are very minimally progressed on sequential CTs dating back to 07/20/2014. No pleural effusion or pneumothorax. Upper Abdomen: No acute abnormality. Musculoskeletal: No chest wall mass or suspicious bone lesions identified. IMPRESSION: 1. There is a redemonstrated pattern of moderate pulmonary fibrosis with an apical to basal gradient, featuring mild traction bronchiectasis, some areas of peripheral bronchiolectasis, irregular ground-glass opacity, and no definite honeycombing. These findings are not significantly changed compared to prior CT dated 09/03/2017, and are very minimally progressed on sequential CTs dating back to 07/20/2014. 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 2.  Coronary artery calcifications. Electronically Signed   By: Eddie Candle M.D.   On: 09/25/2018 10:11     Past medical hx Past Medical History:  Diagnosis Date   Arthritis    Coronary artery calcification seen on CAT scan    PAF (paroxysmal atrial fibrillation) ([place]) 08/02/2014   PTSD (post-traumatic stress disorder)    controlled     Social History   Tobacco Use   Smoking status: Current Every Day Smoker    Packs/day: 0.50    Years: 20.00  Pack years: 10.00    Types: Cigarettes   Smokeless tobacco: Never Used  Substance Use Topics   Alcohol use: No   Drug use: No    Mr.Dicker reports that he has been smoking cigarettes. He has a 10.00 pack-year smoking history.  He has never used smokeless tobacco. He reports that he does not drink alcohol or use drugs.  Tobacco Cessation: Current every day smoker I have spent 3 minutes counseling patient on smoking cessation this visit. Patient verbalizes understanding of his   choice to continue smoking and the negative health consequences it entails.  Past surgical hx, Family hx, Social hx all reviewed.  Current Outpatient Medications on File Prior to Visit  Medication Sig   cholecalciferol (VITAMIN D) 1000 units tablet Take 1,000 Units by mouth daily.   cyanocobalamin 500 MCG tablet Take 500 mcg by mouth daily.   Nintedanib (OFEV) 150 MG CAPS Take 1 capsule (150 mg total) by mouth 2 (two) times daily.   Omega 3 1000 MG CAPS Take by mouth.   No current facility-administered medications on file prior to visit.      No Known Allergies  Review Of Systems:  Constitutional:   No  weight loss, night sweats,  Fevers, chills, fatigue, or  lassitude.  HEENT:   No headaches,  Difficulty swallowing,  Tooth/dental problems, or  Sore throat,                No sneezing, itching, ear ache, nasal congestion, post nasal drip,   CV:  No chest pain,  Orthopnea, PND, swelling in lower extremities, anasarca, dizziness, palpitations, syncope.   GI  No heartburn, indigestion, abdominal pain, nausea, vomiting, diarrhea, change in bowel habits, loss of appetite, bloody stools.   Resp: No shortness of breath with exertion or at rest.  No excess mucus, no productive cough,  No non-productive cough,  No coughing up of blood.  No change in color of mucus.  No wheezing.  No chest wall deformity  Skin: no rash or lesions.  GU: no dysuria, change in color of urine, no urgency or frequency.  No flank pain, no hematuria   MS:  No joint pain or swelling.  No decreased range of motion.  No back pain.  Psych:  No change in mood or affect. No depression or anxiety.  No memory loss.   Vital Signs BP 110/68 (BP Location: Left Arm,  Cuff Size: Normal)    Pulse 67    Ht 6\' 2"  (1.88 m)    Wt 214 lb (97.1 kg)    SpO2 97%    BMI 27.48 kg/m    Physical Exam:  General- No distress,  A&Ox3, pleasant ENT: No sinus tenderness, TM clear, pale nasal mucosa, no oral exudate,no post nasal drip, no LAN Cardiac: S1, S2, regular rate and rhythm, no murmur Chest: No wheeze/ rales/ dullness; no accessory muscle use, no nasal flaring, no sternal retractions, few basilar crackles Abd.: Soft Non-tender, ND, BS +, Body mass index is 27.48 kg/m. Ext: No clubbing cyanosis, edema Neuro:  normal strength, MAE x 4, A&O x 3, appropriate Skin: No rashes,or lesions,  warm and dry Psych: normal mood and behavior   Assessment/Plan  ILD (interstitial lung disease) (HCC) Stable per HRCT Doing well clinically Compliant with OFEV LFT's WNL Plan: We will continue Ofev daily as you have been doing. CT scan shows no significant progression of disease We will schedule you for a 6 minute walk to further evaluate as you do  not tolerate PFT's Your Liver function test was normal 08/2018. Continue to wear you mask as able>> I understand your PTSD makes this hard.. Wear eye protection/ goggles even if you cannot wear a mask. Social distance as able, as you are at increased risk for breathing issues with Covid. Se safe out there! Follow up in 4 months with Dr. Vaughan Browner or Judson Roch NP  Please contact office for sooner follow up if symptoms do not improve or worsen or seek emergency care     Tobacco abuse Continues to smoke  Plan: I have spent 3 minutes counseling patient on smoking cessation this visit. Patient verbalizes understanding of their  choice to continue smoking and the negative health consequences. Consider Lung Cancer Screening program.    Magdalen Spatz, NP 10/08/2018  1:18 PM

## 2018-10-08 NOTE — Assessment & Plan Note (Addendum)
Stable per HRCT Doing well clinically Compliant with OFEV LFT's WNL Plan: We will continue Ofev daily as you have been doing. CT scan shows no significant progression of disease We will schedule you for a 6 minute walk to further evaluate as you do not tolerate PFT's Your Liver function test was normal 08/2018. Continue to wear you mask as able>> I understand your PTSD makes this hard.. Wear eye protection/ goggles even if you cannot wear a mask. Social distance as able, as you are at increased risk for breathing issues with Covid. Se safe out there! Follow up in 4 months with Dr. Vaughan Browner or Judson Roch NP  Please contact office for sooner follow up if symptoms do not improve or worsen or seek emergency care

## 2018-10-08 NOTE — Assessment & Plan Note (Signed)
Continues to smoke  Plan: I have spent 3 minutes counseling patient on smoking cessation this visit. Patient verbalizes understanding of their  choice to continue smoking and the negative health consequences. Consider Lung Cancer Screening program.

## 2018-10-08 NOTE — Patient Instructions (Addendum)
It is good to see you today. We will continue Ofev daily as you have been doing. We will schedule you for a 6 minute walk Your Liver function test was normal 08/2018. Continue to wear you mask as able. Wear eye protection/ goggles even if you cannot wear a mask. Social distance as able, as you are at increased risk for breathing issues with Covid. Se safe out there! Follow up in 4 months with [name] or Bobby Roch NP  Please contact office for sooner follow up if symptoms do not improve or worsen or seek emergency care

## 2018-11-04 ENCOUNTER — Telehealth: Payer: Self-pay | Admitting: Pulmonary Disease

## 2018-11-04 MED ORDER — OFEV 150 MG PO CAPS: 150.0000 mg | ORAL_CAPSULE | Freq: Two times a day (BID) | ORAL | 11 refills | Status: DC

## 2018-11-04 NOTE — Telephone Encounter (Signed)
Patient is returning phone call.  Patient phone number is [phone].

## 2018-11-04 NOTE — Telephone Encounter (Signed)
Spoke with pt and he had been out of OFEV for 5 days.  Refills sent. Nothing further is needed.

## 2018-11-04 NOTE — Telephone Encounter (Signed)
lmtcb x1 pt 

## 2019-02-04 ENCOUNTER — Other Ambulatory Visit: Payer: Self-pay

## 2019-02-04 NOTE — Telephone Encounter (Signed)

## 2019-02-05 ENCOUNTER — Telehealth (INDEPENDENT_AMBULATORY_CARE_PROVIDER_SITE_OTHER): Payer: Medicare HMO | Admitting: Cardiology

## 2019-02-05 ENCOUNTER — Other Ambulatory Visit: Payer: Self-pay

## 2019-02-05 ENCOUNTER — Telehealth: Payer: Self-pay

## 2019-02-05 VITALS — Ht 74.0 in | Wt 215.0 lb

## 2019-02-05 DIAGNOSIS — I4819 Other persistent atrial fibrillation: Secondary | ICD-10-CM | POA: Diagnosis not present

## 2019-02-05 NOTE — Progress Notes (Signed)
Virtual Visit via Video Note   This visit type was conducted due to national recommendations for restrictions regarding the COVID-19 Pandemic (e.g. social distancing) in an effort to limit this patient's exposure and mitigate transmission in our community.  Due to his co-morbid illnesses, this patient is at least at moderate risk for complications without adequate follow up.  This format is felt to be most appropriate for this patient at this time.  All issues noted in this document were discussed and addressed.  A limited physical exam was performed with this format.  Please refer to the patient's chart for his consent to telehealth for [facility].   Evaluation Performed:  Follow-up visit  This visit type was conducted due to national recommendations for restrictions regarding the COVID-19 Pandemic (e.g. social distancing).  This format is felt to be most appropriate for this patient at this time.  All issues noted in this document were discussed and addressed.  No physical exam was performed (except for noted visual exam findings with Video Visits).  Please refer to the patient's chart (MyChart message for video visits and phone note for telephone visits) for the patient's consent to telehealth for [facility]  Date:  02/05/2019   ID:  [name], DOB April 10, 1946, MRN [id number]  Patient Location:  Home  Provider location:   [place]  PCP:  Levin Erp, MD  Cardiologist:  Fransico Him, MD Electrophysiologist:  None   Chief Complaint: atrial fibrillation  History of Present Illness:    [name] is a 73 y.o. male who presents via audio/video conferencing for a telehealth visit today.    [name] is a 73 y.o. male who presents for followup of PAF. He has had a nuclear stress test as an outp that showed no ischemia and normal LVF. He is here today for followup and is doing well.  He denies any chest pain or pressure, SOB, DOE, PND, orthopnea, LE edema,  dizziness, palpitations or syncope. He is compliant with his meds and is tolerating meds with no SE.    The patient does not have symptoms concerning for COVID-19 infection (fever, chills, cough, or new shortness of breath).    Prior CV studies:   The following studies were reviewed today:  none  Past Medical History:  Diagnosis Date  . Arthritis   . Coronary artery calcification seen on CAT scan   . PAF (paroxysmal atrial fibrillation) ([place]) 08/02/2014  . PTSD (post-traumatic stress disorder)    controlled   Past Surgical History:  Procedure Laterality Date  . BACK SURGERY     2000  . JOINT REPLACEMENT      left 2005  . SHOULDER ARTHROSCOPY W/ ROTATOR CUFF REPAIR  2012   right  . TOTAL KNEE ARTHROPLASTY  08/28/2011   Procedure: TOTAL KNEE ARTHROPLASTY;  Surgeon: Garald Balding, MD;  Location: [place];  Service: Orthopedics;  Laterality: Right;     Current Meds  Medication Sig  . cholecalciferol (VITAMIN D) 1000 units tablet Take 1,000 Units by mouth daily.  . cyanocobalamin 500 MCG tablet Take 500 mcg by mouth daily.  . Nintedanib (OFEV) 150 MG CAPS Take 1 capsule (150 mg total) by mouth 2 (two) times daily.  . Omega 3 1000 MG CAPS Take by mouth.     Allergies:   Patient has no known allergies.   Social History   Tobacco Use  . Smoking status: Current Every Day Smoker    Packs/day: 0.50  Years: 20.00    Pack years: 10.00    Types: Cigarettes  . Smokeless tobacco: Never Used  Substance Use Topics  . Alcohol use: No  . Drug use: No     Family Hx: The patient's family history includes Alcoholism in his brother; HIV in his brother; Heart attack in his maternal grandfather; Kidney cancer in his father; Lung cancer in his mother. There is no history of Anesthesia problems, Hypotension, Malignant hyperthermia, or Pseudochol deficiency.  ROS:   Please see the history of present illness.     All other systems reviewed and are negative.   Labs/Other Tests and  Data Reviewed:    Recent Labs: 08/07/2018: ALT 28   Recent Lipid Panel Lab Results  Component Value Date/Time   CHOL 141 07/22/2014 04:23 AM   TRIG 61 07/22/2014 04:23 AM   HDL 50 07/22/2014 04:23 AM   CHOLHDL 2.8 07/22/2014 04:23 AM   LDLCALC 79 07/22/2014 04:23 AM    Wt Readings from Last 3 Encounters:  02/05/19 215 lb (97.5 kg)  10/08/18 214 lb (97.1 kg)  04/10/18 210 lb (95.3 kg)     Objective:    Vital Signs:  Ht 6\' 2"  (1.88 m)   Wt 215 lb (97.5 kg)   BMI 27.60 kg/m    CONSTITUTIONAL:  Well nourished, well developed male in no acute distress.  EYES: anicteric MOUTH: oral mucosa is pink RESPIRATORY: Normal respiratory effort, symmetric expansion CARDIOVASCULAR: No peripheral edema SKIN: No rash, lesions or ulcers MUSCULOSKELETAL: no digital cyanosis NEURO: Cranial Nerves II-XII grossly intact, moves all extremities PSYCH: Intact judgement and insight.  A&O x 3, Mood/affect appropriate   ASSESSMENT & PLAN:    1.  Paroxysmal atrial fibrillation -He thinks he is maintaining NSR -no palpitations -he is not on anticoagulation - his CHADs2VASC score is 1  COVID-19 Education: The signs and symptoms of COVID-19 were discussed with the patient and how to seek care for testing (follow up with PCP or arrange E-visit).  The importance of social distancing was discussed today.  Patient Risk:   After full review of this patient's clinical status, I feel that they are at least moderate risk at this time.  Time:   Today, I have spent 20 minutes directly with the patient on telemedicine discussing medical problems including PA.  We also reviewed the symptoms of COVID 19 and the ways to protect against contracting the virus with telehealth technology.  Medication Adjustments/Labs and Tests Ordered: Current medicines are reviewed at length with the patient today.  Concerns regarding medicines are outlined above.  Tests Ordered: No orders of the defined types were placed in  this encounter.  Medication Changes: No orders of the defined types were placed in this encounter.   Disposition:  Follow up in 1 year(s)  Signed, Fransico Him, MD  02/05/2019 9:19 AM    [facility]

## 2019-02-05 NOTE — Patient Instructions (Signed)
Medication Instructions:  Your provider recommends that you continue on your current medications as directed. Please refer to the Current Medication list given to you today.    Labwork: None  Testing/Procedures: None  Follow-Up: Your provider wants you to follow-up in: 1 year with Dr. Radford Pax. You will receive a reminder letter in the mail two months in advance. If you don't receive a letter, please call our office to schedule the follow-up appointment.

## 2019-02-05 NOTE — Telephone Encounter (Signed)
Virtual Visit Pre-Appointment Phone Call 1. Confirm consent - "In the setting of the current Covid19 crisis, you are scheduled for a (phone or video) visit with your provider on (date) at (time).  Just as we do with many in-office visits, in order for you to participate in this visit, we must obtain consent.  If you'd like, I can send this to your mychart (if signed up) or email for you to review.  Otherwise, I can obtain your verbal consent now.  All virtual visits are billed to your insurance company just like a normal visit would be.  By agreeing to a virtual visit, we'd like you to understand that the technology does not allow for your provider to perform an examination, and thus may limit your provider's ability to fully assess your condition. If your provider identifies any concerns that need to be evaluated in person, we will make arrangements to do so.  Finally, though the technology is pretty good, we cannot assure that it will always work on either your or our end, and in the setting of a video visit, we may have to convert it to a phone-only visit.  In either situation, we cannot ensure that we have a secure connection.  Are you willing to proceed?" STAFF: Did the patient verbally acknowledge consent to telehealth visit? Document YES/NO here: YES    TELEPHONE CALL NOTE  [name] has been deemed a candidate for a follow-up tele-health visit to limit community exposure during the Covid-19 pandemic. I spoke with the patient via phone to ensure availability of phone/video source, confirm preferred email & phone number, and discuss instructions and expectations.  I reminded [name] to be prepared with any vital sign and/or heart rhythm information that could potentially be obtained via home monitoring, at the time of his visit. I reminded [name] to expect a phone call prior to his visit.   IF USING DOXIMITY or [email or web address].ME - The patient will receive a link just prior to  their visit by text.     FULL LENGTH CONSENT FOR TELE-HEALTH VISIT   I hereby voluntarily request, consent and authorize [facility] and its employed or contracted physicians, physician assistants, nurse practitioners or other licensed health care professionals (the Practitioner), to provide me with telemedicine health care services (the "Services") as deemed necessary by the treating Practitioner. I acknowledge and consent to receive the Services by the Practitioner via telemedicine. I understand that the telemedicine visit will involve communicating with the Practitioner through live audiovisual communication technology and the disclosure of certain medical information by electronic transmission. I acknowledge that I have been given the opportunity to request an in-person assessment or other available alternative prior to the telemedicine visit and am voluntarily participating in the telemedicine visit.  I understand that I have the right to withhold or withdraw my consent to the use of telemedicine in the course of my care at any time, without affecting my right to future care or treatment, and that the Practitioner or I may terminate the telemedicine visit at any time. I understand that I have the right to inspect all information obtained and/or recorded in the course of the telemedicine visit and may receive copies of available information for a reasonable fee.  I understand that some of the potential risks of receiving the Services via telemedicine include:  [name] Delay or interruption in medical evaluation due to technological equipment failure or disruption; . Information transmitted may not be sufficient (  e.g. poor resolution of images) to allow for appropriate medical decision making by the Practitioner; and/or  . In rare instances, security protocols could fail, causing a breach of personal health information.  Furthermore, I acknowledge that it is my responsibility to provide information  about my medical history, conditions and care that is complete and accurate to the best of my ability. I acknowledge that Practitioner's advice, recommendations, and/or decision may be based on factors not within their control, such as incomplete or inaccurate data provided by me or distortions of diagnostic images or specimens that may result from electronic transmissions. I understand that the practice of medicine is not an exact science and that Practitioner makes no warranties or guarantees regarding treatment outcomes. I acknowledge that I will receive a copy of this consent concurrently upon execution via email to the email address I last provided but may also request a printed copy by calling the office of [facility].    I understand that my insurance will be billed for this visit.   I have read or had this consent read to me. . I understand the contents of this consent, which adequately explains the benefits and risks of the Services being provided via telemedicine.  . I have been provided ample opportunity to ask questions regarding this consent and the Services and have had my questions answered to my satisfaction. . I give my informed consent for the services to be provided through the use of telemedicine in my medical care  By participating in this telemedicine visit I agree to the above.

## 2019-07-16 ENCOUNTER — Other Ambulatory Visit: Payer: Self-pay

## 2019-07-16 DIAGNOSIS — G629 Polyneuropathy, unspecified: Secondary | ICD-10-CM

## 2019-08-12 ENCOUNTER — Other Ambulatory Visit: Payer: Self-pay

## 2019-08-12 ENCOUNTER — Ambulatory Visit: Payer: Medicare HMO | Admitting: Neurology

## 2020-01-05 ENCOUNTER — Telehealth: Payer: Self-pay | Admitting: Pulmonary Disease

## 2020-01-05 NOTE — Telephone Encounter (Signed)
Attempted to call pt but unable to reach. Left message for him to return call. Pt needs to be scheduled a f/u with Dr. Vaughan Browner as he last saw Dr. Vaughan Browner 04/2018 and last had a visit with Judson Roch 10/2018. It has been a year since pt has been seen at office and since the medication is a specialty medication for his pulmonary fibrosis, pt desperately needs to come into office for the follow up to have Dr. Vaughan Browner reassess.  In regards to the [place] extension, this might need to be sent to [facility] to see if she knows what might need to be done.

## 2020-01-06 NOTE — Telephone Encounter (Signed)
Patient has an appointment with Dr. Vaughan Browner tomorrow. Nothing further needed at this time.

## 2020-01-07 ENCOUNTER — Encounter: Payer: Self-pay | Admitting: Pulmonary Disease

## 2020-01-07 ENCOUNTER — Ambulatory Visit (INDEPENDENT_AMBULATORY_CARE_PROVIDER_SITE_OTHER): Payer: No Typology Code available for payment source | Admitting: Pulmonary Disease

## 2020-01-07 VITALS — BP 116/86 | HR 74 | Temp 98.0°F | Ht 74.0 in | Wt 218.6 lb

## 2020-01-07 DIAGNOSIS — J84112 Idiopathic pulmonary fibrosis: Secondary | ICD-10-CM

## 2020-01-07 DIAGNOSIS — Z5181 Encounter for therapeutic drug level monitoring: Secondary | ICD-10-CM | POA: Diagnosis not present

## 2020-01-07 DIAGNOSIS — J849 Interstitial pulmonary disease, unspecified: Secondary | ICD-10-CM | POA: Diagnosis not present

## 2020-01-07 LAB — COMPREHENSIVE METABOLIC PANEL
ALT: 24 U/L (ref 0–53)
AST: 24 U/L (ref 0–37)
Albumin: 4.3 g/dL (ref 3.5–5.2)
Alkaline Phosphatase: 75 U/L (ref 39–117)
BUN: 16 mg/dL (ref 6–23)
CO2: 31 mEq/L (ref 19–32)
Calcium: 10.6 mg/dL — ABNORMAL HIGH (ref 8.4–10.5)
Chloride: 101 mEq/L (ref 96–112)
Creatinine, Ser: 0.78 mg/dL (ref 0.40–1.50)
GFR: 97.32 mL/min (ref >60.00)
Glucose, Bld: 102 mg/dL — ABNORMAL HIGH (ref 70–99)
Potassium: 4.4 mEq/L (ref 3.5–5.1)
Sodium: 136 mEq/L (ref 135–145)
Total Bilirubin: 1.4 mg/dL — ABNORMAL HIGH (ref 0.2–1.2)
Total Protein: 7.1 g/dL (ref 6.0–8.3)

## 2020-01-07 NOTE — Addendum Note (Signed)
Addended by: Vanessa Barbara on: 01/07/2020 01:07 PM   Modules accepted: Orders

## 2020-01-07 NOTE — Addendum Note (Signed)
Addended by: Vanessa Barbara on: 01/07/2020 01:01 PM   Modules accepted: Orders

## 2020-01-07 NOTE — Addendum Note (Signed)
Addended by: Suzzanne Cloud E on: 01/07/2020 12:53 PM   Modules accepted: Orders

## 2020-01-07 NOTE — Patient Instructions (Signed)
We will check a comprehensive metabolic panel today for monitoring Schedule high-res CT and 6-minute walk test in 3 months Follow-up in clinic after 3 months

## 2020-01-07 NOTE — Progress Notes (Signed)
[name]    [id number]    01/20/1946  Primary Care Physician:Green, Christean Grief, MD  Referring Physician: Levin Erp, Je[address] [place], [address],  [place] [address]  Chief complaint: Follow-up for IPF Started ofev July 2019  HPI: [name] is a 74 year old with past medical history of atrial fibrillation, coronary artery disease, tobacco use. He had a CT scan of the chest for evaluation of dyspnea which showed pulmonary fibrosis. He also had PFTs which showed restriction and diffusion defect concerning for interstitial lung disease. He has been sent here for further evaluation  His chief complaint is dyspnea on exertion, climbing status. He denies dyspnea at rest, cough, sputum production, wheezing. His only on aspirin and vitamin D. However he takes multiple over-the-counter naturals and herbals including protandim, B12, omega-3, turmeric among others.   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 He is exposed to [place] in [place] Smoking history: Half pack per day for 25 years. He still continues to smoke. ILD questionnaire 02/03/18-has a humidifier at home otherwise negative for exposures, occupational history, medication history.  Interim history: Continues on Ofev without issue.  States that breathing is stable  Outpatient Encounter Medications as of 01/07/2020  Medication Sig  . Ascorbic Acid (VITAMIN C) 100 MG tablet Take 100 mg by mouth daily.  . cholecalciferol (VITAMIN D) 1000 units tablet Take 1,000 Units by mouth daily.  . cyanocobalamin 500 MCG tablet Take 500 mcg by mouth daily.  . Menaquinone-7 (VITAMIN K2) 100 MCG CAPS Take by mouth.  . Nintedanib (OFEV) 150 MG CAPS Take 1 capsule (150 mg total) by mouth 2 (two) times daily.  . Omega 3 1000 MG CAPS Take by mouth.  . Zinc  100 MG TABS Take by mouth.   No facility-administered encounter medications on file as of 01/07/2020.   Physical Exam: Blood pressure 116/86, pulse 74, temperature 98 F (36.7 C), temperature source Oral, height 6\' 2"  (1.88 m), weight 218 lb 9.6 oz (99.2 kg), SpO2 97 %. Gen:      No acute distress HEENT:  EOMI, sclera anicteric Neck:     No masses; no thyromegaly Lungs:    Clear to auscultation bilaterally; normal respiratory effort CV:         Regular rate and rhythm; no murmurs Abd:      + bowel sounds; soft, non-tender; no palpable masses, no distension Ext:    No edema; adequate peripheral perfusion Skin:      Warm and dry; no rash Neuro: alert and oriented x 3 Psych: normal mood and affect  Data Reviewed: Imaging CT angio chest 08/21/16-no evidence of pulmonary emboli, mild atherosclerosis, coronary artery calcification. Retuicalar markings which have basilar predominant. No clear evidence of honeycombing. No consolidation, lung infiltrate. This appears to have progressed slightly from 2016.  High res CT 09/03/17 Basal predominant fibrosis, traction bronchiectasis.  No honeycombing Upper lobe paraseptal emphysema. I have reviewed the images personally.  High-res CT 09/24/2018-stable pulmonary fibrosis and probable UIP pattern   PFTs 09/27/16 FVC 4.08 (84%), FEV1 3.23 (90%), F/F 79, TLC 70%, DLCO 52% Mild restriction with moderate-severe DLCO impairment. Small airway disease with improvement in flow rates postbronchodilator  Unable to complete repeat PFTs in spite of multiple attempts.  6-minute walk test 10/03/17 390 m Starting heart rate, stats 78, 97%  Ending heart rate, sats 103, 94%  Outside records Imaging from the [place] Chest x-ray 05/20/17 Chronic lung changes, bibasilar scarring  CT chest 05/20/17  Widespread subpleural reticulation greatest in the bases, groundglass opacities at the bases, sub pleural cystic changes in the upper aspect of the lung Scattered nodular  opacities-10 mm in the right lower lobe, 6 mm the left lung apex, 7 mm in the right lung apex. Left adrenal adenoma.  Labs: Hypersensitivity profile 10/04/16-negative Connective tissue serology 10/04/16-ANA, CCP, rheumatoid factor, CRP, aldolase-negative CK 443 SCL70, Jo1 10/09/17-negative  Assessment:  Follow-up for interstitial lung disease, IPF CT scan shows basal predominant reticular opacities that is clearly progressive from 2016. 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 He does not have any significant exposures except for agent orange. There is no exposure to asbestos. He does not have signs and symptoms of connective tissue disease or autoimmune process. We had an extensive discussion with him regarding the possible etiologies of pulmonary fibrosis and the treatment options.  Started on Ofev through the [place].  He is tolerating it well except for occasional diarrhea and indigestion He is unable to complete PFTs in spite of multiple attempts  Get HRCT and 6-minute walk test in 6 months   Plan/Recommendations: - Continue Ofev - Check hepatic function panel. - Follow-up high-resolution CT, 6-minute walk test.  Marshell Garfinkel MD [facility] 01/07/2020, 12:30 PM  CC: Levin Erp, MD

## 2020-01-12 ENCOUNTER — Other Ambulatory Visit: Payer: Self-pay | Admitting: *Deleted

## 2020-01-12 DIAGNOSIS — J849 Interstitial pulmonary disease, unspecified: Secondary | ICD-10-CM

## 2020-01-12 DIAGNOSIS — Z5181 Encounter for therapeutic drug level monitoring: Secondary | ICD-10-CM

## 2020-01-21 ENCOUNTER — Ambulatory Visit: Payer: Medicare HMO

## 2020-02-01 ENCOUNTER — Ambulatory Visit: Payer: Medicare PPO

## 2020-02-15 ENCOUNTER — Other Ambulatory Visit: Payer: Self-pay

## 2020-02-15 ENCOUNTER — Other Ambulatory Visit: Payer: Medicare PPO

## 2020-02-15 LAB — COMPREHENSIVE METABOLIC PANEL
ALT: 31 U/L (ref 0–53)
AST: 29 U/L (ref 0–37)
Albumin: 4.2 g/dL (ref 3.5–5.2)
Alkaline Phosphatase: 77 U/L (ref 39–117)
BUN: 16 mg/dL (ref 6–23)
CO2: 28 mEq/L (ref 19–32)
Calcium: 10.6 mg/dL — ABNORMAL HIGH (ref 8.4–10.5)
Chloride: 102 mEq/L (ref 96–112)
Creatinine, Ser: 0.78 mg/dL (ref 0.40–1.50)
GFR: 88.91 mL/min (ref >60.00)
Glucose, Bld: 120 mg/dL — ABNORMAL HIGH (ref 70–99)
Potassium: 4 mEq/L (ref 3.5–5.1)
Sodium: 136 mEq/L (ref 135–145)
Total Bilirubin: 2 mg/dL — ABNORMAL HIGH (ref 0.2–1.2)
Total Protein: 7.1 g/dL (ref 6.0–8.3)

## 2020-02-19 ENCOUNTER — Other Ambulatory Visit: Payer: Self-pay | Admitting: Pulmonary Disease

## 2020-02-19 ENCOUNTER — Ambulatory Visit: Payer: No Typology Code available for payment source

## 2020-02-19 ENCOUNTER — Encounter: Payer: Self-pay | Admitting: Pulmonary Disease

## 2020-02-19 ENCOUNTER — Other Ambulatory Visit: Payer: Self-pay

## 2020-02-19 DIAGNOSIS — Z5181 Encounter for therapeutic drug level monitoring: Secondary | ICD-10-CM

## 2020-02-19 DIAGNOSIS — J849 Interstitial pulmonary disease, unspecified: Secondary | ICD-10-CM

## 2020-02-19 NOTE — Progress Notes (Signed)
Six Minute Walk - 02/19/20 1532      Six Minute Walk   Medications taken before test (dose and time) Vit C, Vit D, B-12, Vit K2, Ofev, Omega 3, Zinc all taken at 12:30    Supplemental oxygen during test? No    Lap distance in meters  34 meters    Laps Completed 10    Partial lap (in meters) 1 meters    Baseline BP (sitting) 104/70    Baseline Heartrate 75    Baseline Dyspnea (Borg Scale) 1    Baseline Fatigue (Borg Scale) 3    Baseline SPO2 98 %      End of Test Values    BP (sitting) 118/70    Heartrate 80    Dyspnea (Borg Scale) 2    Fatigue (Borg Scale) 4    SPO2 95 %      2 Minutes Post Walk Values   BP (sitting) 108/72    Heartrate 79    SPO2 98 %    Stopped or paused before six minutes? No    Other Symptoms at end of exercise: --   No complaits other than normal neuropathy pain     Interpretation   Distance completed 341 meters    Tech Comments: Patient walked average pace, completed entire 6 minute walk without stopping.

## 2020-04-12 ENCOUNTER — Ambulatory Visit (INDEPENDENT_AMBULATORY_CARE_PROVIDER_SITE_OTHER)
Admission: RE | Admit: 2020-04-12 | Discharge: 2020-04-12 | Disposition: A | Discharge status: Home / Self Care | Payer: No Typology Code available for payment source | Source: Ambulatory Visit | Attending: Pulmonary Disease | Admitting: Pulmonary Disease

## 2020-04-12 ENCOUNTER — Other Ambulatory Visit: Payer: Self-pay

## 2020-04-12 DIAGNOSIS — J849 Interstitial pulmonary disease, unspecified: Secondary | ICD-10-CM

## 2020-04-13 ENCOUNTER — Telehealth: Payer: Self-pay | Admitting: Pulmonary Disease

## 2020-04-13 DIAGNOSIS — R911 Solitary pulmonary nodule: Secondary | ICD-10-CM

## 2020-04-13 NOTE — Telephone Encounter (Signed)
Will call Dr. Vaughan Browner.  Left voicemail.  Also sent text message.  Will route via epic.  For call report regarding high-resolution CT chest.  Please advise  Wyn Quaker, FNP

## 2020-04-14 NOTE — Telephone Encounter (Signed)
Agreed. Thanks. Leroy Sea

## 2020-04-14 NOTE — Telephone Encounter (Signed)
I reviewed films with Dr Valeta Harms.  Recommend Super D CT chest in 3 months, no contrast. If it enlarges then we will would consider PET and then probably ENB.

## 2020-04-14 NOTE — Telephone Encounter (Signed)
Will review CT with Dr. Lamonte Sakai and Dr. Valeta Harms to see if we can do a ENB.   Can you review the CT. Do you think PET vs biopsy? He is a smoker with IPF. Would be able to tolerate a procedure if needed.

## 2020-04-15 NOTE — Telephone Encounter (Signed)
Thanks for the input.  I have discussed these recommendations with the patient  [name]-please order super D CT in 3 months for lung nodule and follow-up with me in clinic.

## 2020-04-15 NOTE — Telephone Encounter (Signed)
Super D CT ordered for 3 months and recall placed for 3 month follow after CT. Nothing further at this time.

## 2020-05-03 ENCOUNTER — Telehealth: Payer: Self-pay | Admitting: Pulmonary Disease

## 2020-05-03 MED ORDER — OFEV 150 MG PO CAPS: 150.0000 mg | ORAL_CAPSULE | Freq: Two times a day (BID) | ORAL | 5 refills | Status: DC

## 2020-05-03 NOTE — Telephone Encounter (Signed)
Called and spoke with pt and he stated that he does not have any refills left of the ofev.   Per last ov note he was doing well on this medication.  Refills have been sent to the [facility] in [place] per pt.  Nothing further is needed.

## 2020-06-29 ENCOUNTER — Telehealth: Payer: Self-pay | Admitting: Pulmonary Disease

## 2020-06-29 NOTE — Telephone Encounter (Signed)
LMTCB

## 2020-06-30 NOTE — Progress Notes (Signed)
@Patient  ID: [name], male    DOB: 1946/01/13, 75 y.o.   MRN: [id number]  Chief Complaint  Patient presents with  . Follow-up    Pt states he had complaints bronchitis about 2 weeks ago which treated with meds. Pt states he is still coughing but not getting any phlegm up. Pt also hoarse and is losing weight without trying to lose weight.    Referring provider: Levin Erp, MD  HPI: 75 year old male, current every day smoker. PMH significant for ILD, afib, coronary artery calcification. Patient of Dr. Vaughan Browner, last seen on 01/07/20. Started Ofev July 2019.   Previous [facility] pulmonary encounter: 01/07/20- Dr. Vaughan Browner  [name] is a 75 year old with past medical history of atrial fibrillation, coronary artery disease, tobacco use. He had a CT scan of the chest for evaluation of dyspnea which showed pulmonary fibrosis. He also had PFTs which showed restriction and diffusion defect concerning for interstitial lung disease. He has been sent here for further evaluation  His chief complaint is dyspnea on exertion, climbing status. He denies dyspnea at rest, cough, sputum production, wheezing. His only on aspirin and vitamin D. However he takes multiple over-the-counter naturals and herbals including protandim, B12, omega-3, turmeric among others.   Continues on Ofev without issue.  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 He is exposed to [place] in [place] Smoking history: Half pack per day for 25 years. He still continues to smoke. ILD questionnaire 02/03/18-has a humidifier at home otherwise negative for exposures, occupational history, medication history.  07/01/2020- Interim hx  Patient presents today for 6 month follow-up/ ILD. Breathing is stable, he recently had  bronchitis treated by [facility]. Cough was productive with purulent mucus.  Cough has improved but mucus color has cleared. He has lost approximately 10-15 lbs since September. His diet and activity level has not changed bt  he is verya ctive. He plays tennis and has a gym at home. He has not played tennis this past month d/t carple tunnel surgery. He has upcoming Super-D CT Chest scheduled for early March to follow-up on 66mm RUL pulmonary nodule. Denies hemoptysis, shortness of breath, chest tightness.   No Known Allergies  Immunization History  Administered Date(s) Administered  . PFIZER(Purple Top)SARS-COV-2 Vaccination 08/08/2019, 09/09/2019  . Pneumococcal Conjugate-13 04/23/2011  . Tdap 07/18/2011  . Zoster Recombinat (Shingrix) 05/20/2013    Past Medical History:  Diagnosis Date  . Arthritis   . Coronary artery calcification seen on CAT scan   . PAF (paroxysmal atrial fibrillation) ([place]) 08/02/2014  . PTSD (post-traumatic stress disorder)    controlled    Tobacco History: Social History   Tobacco Use  Smoking Status Current Every Day Smoker  . Packs/day: 0.50  . Years: 40.00  . Pack years: 20.00  . Types: Cigarettes  . Start date: 1982  Smokeless Tobacco Never Used  Tobacco Comment   5-10 cigarettes/day as of 07/01/20   Ready to quit: Not Answered Counseling given: Not Answered Comment: 5-10 cigarettes/day as of 07/01/20   Outpatient Medications Prior to Visit  Medication Sig Dispense Refill  . Ascorbic Acid (VITAMIN C) 100 MG tablet Take 100 mg by mouth daily.    . cholecalciferol (VITAMIN D) 1000 units tablet Take 1,000 Units by mouth daily.    . cyanocobalamin 500 MCG  tablet Take 500 mcg by mouth daily.    . Menaquinone-7 (VITAMIN K2) 100 MCG CAPS Take by mouth.    . Nintedanib (OFEV) 150 MG CAPS Take 1 capsule (150 mg total) by mouth 2 (two) times daily. 60 capsule 5  . Omega 3 1000 MG CAPS Take by mouth.    . Zinc 100 MG TABS Take by mouth.    Kendall Flack 575  MG/5ML SYRP Syrup - Take 1 cupful by mouth daily     No facility-administered medications prior to visit.   Review of Systems  Review of Systems  Constitutional: Positive for unexpected weight change. Negative for activity change, appetite change, chills and fever.  HENT: Negative.   Respiratory: Positive for cough. Negative for chest tightness, shortness of breath and wheezing.   Cardiovascular: Negative.    Physical Exam  BP 110/70 (BP Location: Left Arm, Cuff Size: Normal)   Pulse 71   Temp 98.3 F (36.8 C) (Temporal)   Ht 6\' 2"  (1.88 m)   Wt 200 lb 3.2 oz (90.8 kg)   SpO2 100% Comment: RA  BMI 25.70 kg/m  Physical Exam Constitutional:      Appearance: Normal appearance.  HENT:     Head: Normocephalic and atraumatic.  Cardiovascular:     Rate and Rhythm: Normal rate.     Comments: Regularly irregular (hx afib); HR 71  Pulmonary:     Effort: Pulmonary effort is normal.     Breath sounds: Rales present. No wheezing.     Comments: Fine rales lung bases  Neurological:     General: No focal deficit present.     Mental Status: He is alert and oriented to person, place, and time. Mental status is at baseline.  Psychiatric:        Mood and Affect: Mood normal.        Behavior: Behavior normal.        Thought Content: Thought content normal.        Judgment: Judgment normal.      Lab Results:  CBC    Component Value Date/Time   WBC 8.2 07/22/2014 0423   RBC 5.23 07/22/2014 0423   HGB 13.3 07/22/2014 0423   HCT 42.0 07/22/2014 0423   PLT 160 07/22/2014 0423   MCV 80.3 07/22/2014 0423   MCH 25.4 (L) 07/22/2014 0423   MCHC 31.7 07/22/2014 0423   RDW 15.5 07/22/2014 0423   LYMPHSABS 1.3 07/20/2014 2215   MONOABS 1.2 (H) 07/20/2014 2215   EOSABS 0.1 07/20/2014 2215   BASOSABS 0.0 07/20/2014 2215    BMET    Component Value Date/Time   NA 136 02/15/2020 1419   NA 140 08/14/2016 1427   K 4.0 02/15/2020 1419   CL 102 02/15/2020 1419   CO2 28 02/15/2020 1419    GLUCOSE 120 (H) 02/15/2020 1419   BUN 16 02/15/2020 1419   BUN 16 08/14/2016 1427   CREATININE 0.78 02/15/2020 1419   CALCIUM 10.6 (H) 02/15/2020 1419   GFRNONAA 80 08/14/2016 1427   GFRAA 92 08/14/2016 1427    BNP    Component Value Date/Time   BNP 93.7 07/20/2014 2215    ProBNP No results found for: PROBNP  Imaging: No results found.   Assessment & Plan:   ILD (interstitial lung disease) ([place]) - Maintained on OFEV 150mg  BID. Tolerating medication well but he has had some recent weight loss. He is not on oxygen. Still able to do all ADLs including tennis. Due for hepatic function  panel today. He is overdue for repeat spirometry.   Right lower lobe pulmonary nodule -HRCT 04/12/20 showed a new 9 mm right lower lobe nodule. Lesion was borderline in size for PET resolution. He has lost of 10-15lbs since September 2021 without trying. Denies hemoptysis. He is scheduled for Super-D CT Chest on March 10th, 2022. Discussed potential need for bronchoscopy.   Martyn Ehrich, NP 07/01/2020

## 2020-07-01 ENCOUNTER — Other Ambulatory Visit: Payer: Self-pay

## 2020-07-01 ENCOUNTER — Ambulatory Visit (INDEPENDENT_AMBULATORY_CARE_PROVIDER_SITE_OTHER): Payer: No Typology Code available for payment source | Admitting: Primary Care

## 2020-07-01 ENCOUNTER — Encounter: Payer: Self-pay | Admitting: Primary Care

## 2020-07-01 VITALS — BP 110/70 | HR 71 | Temp 98.3°F | Ht 74.0 in | Wt 200.2 lb

## 2020-07-01 DIAGNOSIS — J849 Interstitial pulmonary disease, unspecified: Secondary | ICD-10-CM | POA: Diagnosis not present

## 2020-07-01 DIAGNOSIS — R911 Solitary pulmonary nodule: Secondary | ICD-10-CM | POA: Diagnosis not present

## 2020-07-01 LAB — HEPATIC FUNCTION PANEL
ALT: 38 U/L (ref 0–53)
AST: 28 U/L (ref 0–37)
Albumin: 3.9 g/dL (ref 3.5–5.2)
Alkaline Phosphatase: 86 U/L (ref 39–117)
Bilirubin, Direct: 0.2 mg/dL (ref 0.0–0.3)
Total Bilirubin: 1.1 mg/dL (ref 0.2–1.2)
Total Protein: 6.8 g/dL (ref 6.0–8.3)

## 2020-07-01 NOTE — Patient Instructions (Addendum)
Recommendations: - Continue OFEV 150mg  twice daily - You have an upcoming CT chest scheduled for March re: pulmonary nodule - Continue to encourage smoking cessation   Orders: - Labs today (liver function panel) - PFTs first available   Follow-up: - 6 months with Dr. Vaughan Browner    Pulmonary Fibrosis  Pulmonary fibrosis is a type of lung disease that causes scarring. Over time, the scar tissue builds up in the air sacs of your lungs (alveoli). This makes it hard for you to breathe. Less oxygen can get into your blood. Scarring from pulmonary fibrosis gets worse over time. This damage is permanent and may lead to other serious health problems. What are the causes? There are many different causes of pulmonary fibrosis. Sometimes the cause is not known. This is called idiopathic pulmonary fibrosis. Other causes include:  Exposure to chemicals and substances found in agricultural, farm, Architect, or factory work. These include mold, asbestos, silica, metal dusts, and toxic fumes.  Sarcoidosis. In this disease, areas of inflammatory cells (granulomas) form and most often affect the lungs.  Autoimmune diseases. These include diseases such as rheumatoid arthritis, systemic sclerosis, or connective tissue disease.  Taking certain medicines. These include drugs used in radiation therapy or used to treat seizures, heart problems, and some infections. What increases the risk? You are more likely to develop this condition if:  You have a family history of the disease.  You are older. The condition is more common in older adults.  You have a history of smoking.  You have a job that exposes you to certain chemicals.  You have gastroesophageal reflux disease (GERD). What are the signs or symptoms? Symptoms of this condition include:  Difficulty breathing that gets worse with activity.  Shortness of breath (dyspnea).  Dry, hacking cough.  Rapid, shallow breathing during exercise or  while at rest.  Bluish skin and lips.  Loss of appetite.  Weakness.  Weight loss and fatigue.  Rounded and enlarged fingertips (clubbing). How is this diagnosed? This condition may be diagnosed based on:  Your symptoms and medical history.  A physical exam. You may also have tests, including:  A test that involves looking inside your lungs with an instrument (bronchoscopy).  Imaging studies of your lungs and heart.  Tests to measure how well you are breathing (pulmonary function tests).  Blood tests.  Tests to see how well your lungs work while you are walking (pulmonary stress test).  A procedure to remove a lung tissue sample to look at it under a microscope (biopsy). How is this treated? There is no cure for pulmonary fibrosis. Treatment focuses on managing symptoms and preventing scarring from getting worse. This may include:  Medicines, such as: ? Steroids to prevent permanent lung changes. ? Medicines to suppress your body's defense system (immune system). ? Medicines to help with lung function by reducing inflammation or scarring.  Ongoing monitoring with X-rays and lab work.  Oxygen therapy.  Pulmonary rehabilitation.  Surgery. In some cases, a lung transplant is possible. Follow these instructions at home: Medicines  Take over-the-counter and prescription medicines only as told by your health care provider.  Keep your vaccinations up to date as recommended by your health care provider. General instructions  Do not use any products that contain nicotine or tobacco, such as cigarettes and e-cigarettes. If you need help quitting, ask your health care provider.  Get regular exercise, but do not overexert yourself. Ask your health care provider to suggest some activities that are  safe for you to do. ? If you have physical limitations, you may get exercise by walking, using a stationary bike, or doing chair exercises. ? Ask your health care provider about  using oxygen while exercising.  If you are exposed to chemicals and substances at work, make sure that you wear a mask or respirator at all times.  Join a pulmonary rehabilitation program or a support group for people with pulmonary fibrosis.  Eat small meals often so you do not get too full. Overeating can make breathing trouble worse.  Maintain a healthy weight. Lose weight if you need to.  Do breathing exercises as directed by your health care provider.  Keep all follow-up visits as told by your health care provider. This is important.      Contact a health care provider if you:  Have symptoms that do not get better with medicines.  Are not able to be as active as usual.  Have trouble taking a deep breath.  Have a fever or chills.  Have blue lips or skin.  Have clubbing of your fingers. Get help right away if you:  Have a sudden worsening of your symptoms.  Have chest pain.  Cough up mucus that is dark in color.  Have a lot of headaches.  Get very confused or sleepy. Summary  Pulmonary fibrosis is a type of lung disease that causes scar tissue to build up in the air sacs of your lungs (alveoli) over time. Less oxygen can get into your blood. This makes it hard for you to breathe.  Scarring from pulmonary fibrosis gets worse over time. This damage is permanent and may lead to other serious health problems.  You are more likely to develop this condition if you have a family history of the condition or a job that exposes you to certain chemicals.  There is no cure for pulmonary fibrosis. Treatment focuses on managing symptoms and preventing scarring from getting worse. This information is not intended to replace advice given to you by your health care provider. Make sure you discuss any questions you have with your health care provider. Document Revised: 05/29/2017 Document Reviewed: 05/29/2017 Elsevier Patient Education  2021 [place].   Pulmonary  Nodule  A pulmonary nodule is a small, round growth of tissue in the lung. A nodule may be cancer, but most nodules are not cancer. What are the causes?  Infection from a germ (bacteria, fungus, or virus), such as tuberculosis.  Tissue that is cancer, such as: ? Cancer in the lung. ? Cancer that has spread to the lung from another part of the body.  A growth of tissue (mass) that is not cancer.  Swelling and irritation from conditions such as rheumatoid arthritis.  Having blood vessels that are not normal in the lungs. What are the signs or symptoms? Many times, there are no symptoms. If you get symptoms, they normally have another cause, such as infection. How is this treated? Treatment depends on:  If your nodule is cancer or if it is not cancer.  What your risk of getting cancer is. Some nodules are not cancer. If this is the case for you, you may not need treatment. Your doctor may do tests to watch the nodule for changes. If the nodule is cancer:  You will need tests, such as CT and PET scans.  You may need treatment. This may include: ? Surgery. ? Treatment with high-energy X-rays (radiation therapy). ? Medicines. Some nodules need to be taken  out. You may have a procedure to have the nodule taken out. During the procedure, your doctor will make a cut (incision) into your chest and take out the part of your lung that has the nodule. Follow these instructions at home:  Take over-the-counter and prescription medicines only as told by your doctor.  Do not smoke or use any products that contain nicotine or tobacco. If you need help quitting, ask your doctor.  Keep all follow-up visits. Contact a doctor if:  You have pain in your chest, back, or shoulder.  You are short of breath or have trouble breathing when you are active.  You get a cough.  Your voice starts to sound raspy, breathy, or strained (hoarse), and you do not know why.  You feel sick or more tired  than normal.  You do not feel like eating.  You lose weight without trying.  You get chills, or you start to sweat a lot during sleep.  You need two or more pillows to sleep on at night.  You have: ? A fever and your symptoms get worse all of a sudden. ? A fever or symptoms for more than 2-3 days. Get help right away if:  You cannot catch your breath.  You have sudden chest pain.  You start making high-pitched whistling sounds when you breathe, most often when you breathe out (you wheeze).  You cannot stop coughing.  You cough up blood or bloody mucus from your lungs (sputum).  You get dizzy or feel like you may faint. These symptoms may represent a serious problem that is an emergency. Do not wait to see if the symptoms will go away. Get medical help right away. Call your local emergency services (911 in the [place] Do not drive yourself to the hospital. Summary  A pulmonary nodule is a small, round growth of tissue in the lung. Most of these nodules are not cancer.  Common causes of nodules in the lung include infection, swelling and irritation, and growths that are not cancer.  Treatment depends on whether the nodule is cancer or is not cancer. Treatment also depends on your risk of getting cancer.  If the nodule is cancer, you will need certain tests and treatments as told by your doctor. This information is not intended to replace advice given to you by your health care provider. Make sure you discuss any questions you have with your health care provider. Document Revised: 11/11/2019 Document Reviewed: 11/11/2019 Elsevier Patient Education  [name].    Steps to Quit Smoking Smoking tobacco is the leading cause of preventable death. It can affect almost every organ in the body. Smoking puts you and people around you at risk for many serious, long-lasting (chronic) diseases. Quitting smoking can be hard, but it is one of the best things that you can do for your  health. It is never too late to quit. How do I get ready to quit? When you decide to quit smoking, make a plan to help you succeed. Before you quit:  Pick a date to quit. Set a date within the next 2 weeks to give you time to prepare.  Write down the reasons why you are quitting. Keep this list in places where you will see it often.  Tell your family, friends, and co-workers that you are quitting. Their support is important.  Talk with your doctor about the choices that may help you quit.  Find out if your health insurance will pay for  these treatments.  Know the people, places, things, and activities that make you want to smoke (triggers). Avoid them. What first steps can I take to quit smoking?  Throw away all cigarettes at home, at work, and in your car.  Throw away the things that you use when you smoke, such as ashtrays and lighters.  Clean your car. Make sure to empty the ashtray.  Clean your home, including curtains and carpets. What can I do to help me quit smoking? Talk with your doctor about taking medicines and seeing a counselor at the same time. You are more likely to succeed when you do both.  If you are pregnant or breastfeeding, talk with your doctor about counseling or other ways to quit smoking. Do not take medicine to help you quit smoking unless your doctor tells you to do so. To quit smoking: Quit right away  Quit smoking totally, instead of slowly cutting back on how much you smoke over a period of time.  Go to counseling. You are more likely to quit if you go to counseling sessions regularly. Take medicine You may take medicines to help you quit. Some medicines need a prescription, and some you can buy over-the-counter. Some medicines may contain a drug called nicotine to replace the nicotine in cigarettes. Medicines may:  Help you to stop having the desire to smoke (cravings).  Help to stop the problems that come when you stop smoking (withdrawal  symptoms). Your doctor may ask you to use:  Nicotine patches, gum, or lozenges.  Nicotine inhalers or sprays.  Non-nicotine medicine that is taken by mouth. Find resources Find resources and other ways to help you quit smoking and remain smoke-free after you quit. These resources are most helpful when you use them often. They include:  Online chats with a Social worker.  Phone quitlines.  Printed Furniture conservator/restorer.  Support groups or group counseling.  Text messaging programs.  Mobile phone apps. Use apps on your mobile phone or tablet that can help you stick to your quit plan. There are many free apps for mobile phones and tablets as well as websites. Examples include Quit Guide from the [employer] and smokefree.gov   What things can I do to make it easier to quit?  Talk to your family and friends. Ask them to support and encourage you.  Call a phone quitline (1-800-QUIT-NOW), reach out to support groups, or work with a Social worker.  Ask people who smoke to not smoke around you.  Avoid places that make you want to smoke, such as: ? Bars. ? Parties. ? Smoke-break areas at work.  Spend time with people who do not smoke.  Lower the stress in your life. Stress can make you want to smoke. Try these things to help your stress: ? Getting regular exercise. ? Doing deep-breathing exercises. ? Doing yoga. ? Meditating. ? Doing a body scan. To do this, close your eyes, focus on one area of your body at a time from head to toe. Notice which parts of your body are tense. Try to relax the muscles in those areas.   How will I feel when I quit smoking? Day 1 to 3 weeks Within the first 24 hours, you may start to have some problems that come from quitting tobacco. These problems are very bad 2-3 days after you quit, but they do not often last for more than 2-3 weeks. You may get these symptoms:  Mood swings.  Feeling restless, nervous, angry, or annoyed.  Trouble  concentrating.  Dizziness.  Strong desire for high-sugar foods and nicotine.  Weight gain.  Trouble pooping (constipation).  Feeling like you may vomit (nausea).  Coughing or a sore throat.  Changes in how the medicines that you take for other issues work in your body.  Depression.  Trouble sleeping (insomnia). Week 3 and afterward After the first 2-3 weeks of quitting, you may start to notice more positive results, such as:  Better sense of smell and taste.  Less coughing and sore throat.  Slower heart rate.  Lower blood pressure.  Clearer skin.  Better breathing.  Fewer sick days. Quitting smoking can be hard. Do not give up if you fail the first time. Some people need to try a few times before they succeed. Do your best to stick to your quit plan, and talk with your doctor if you have any questions or concerns. Summary  Smoking tobacco is the leading cause of preventable death. Quitting smoking can be hard, but it is one of the best things that you can do for your health.  When you decide to quit smoking, make a plan to help you succeed.  Quit smoking right away, not slowly over a period of time.  When you start quitting, seek help from your doctor, family, or friends. This information is not intended to replace advice given to you by your health care provider. Make sure you discuss any questions you have with your health care provider. Document Revised: 01/16/2019 Document Reviewed: 07/12/2018 Elsevier Patient Education  [place].

## 2020-07-01 NOTE — Assessment & Plan Note (Addendum)
-  HRCT 04/12/20 showed a new 9 mm right lower lobe nodule. Lesion was borderline in size for PET resolution. He has lost of 10-15lbs since September 2021 without trying. Denies hemoptysis. He is scheduled for Super-D CT Chest on March 10th, 2022. Discussed potential need for bronchoscopy.

## 2020-07-01 NOTE — Progress Notes (Signed)
LFTs normal

## 2020-07-01 NOTE — Assessment & Plan Note (Addendum)
-   Maintained on OFEV 150mg  BID. Tolerating medication well but he has had some recent weight loss. He is not on oxygen. Still able to do all ADLs including tennis. Due for hepatic function panel today. He is overdue for repeat spirometry.

## 2020-07-01 NOTE — Telephone Encounter (Signed)
Pt was seen in office today 07/01/20, with labs. Will close encounter.

## 2020-07-04 ENCOUNTER — Encounter: Payer: Self-pay | Admitting: *Deleted

## 2020-07-12 ENCOUNTER — Other Ambulatory Visit (HOSPITAL_COMMUNITY)
Admission: RE | Admit: 2020-07-12 | Discharge: 2020-07-12 | Disposition: A | Discharge status: Home / Self Care | Payer: Medicare Other | Source: Ambulatory Visit | Attending: Pulmonary Disease | Admitting: Pulmonary Disease

## 2020-07-12 DIAGNOSIS — Z20822 Contact with and (suspected) exposure to covid-19: Secondary | ICD-10-CM | POA: Insufficient documentation

## 2020-07-12 DIAGNOSIS — Z01812 Encounter for preprocedural laboratory examination: Secondary | ICD-10-CM | POA: Insufficient documentation

## 2020-07-13 LAB — SARS CORONAVIRUS 2 (TAT 6-24 HRS): SARS Coronavirus 2: NEGATIVE

## 2020-07-14 ENCOUNTER — Ambulatory Visit (INDEPENDENT_AMBULATORY_CARE_PROVIDER_SITE_OTHER)
Admission: RE | Admit: 2020-07-14 | Discharge: 2020-07-14 | Disposition: A | Discharge status: Home / Self Care | Payer: No Typology Code available for payment source | Source: Ambulatory Visit | Attending: Pulmonary Disease | Admitting: Pulmonary Disease

## 2020-07-14 ENCOUNTER — Other Ambulatory Visit: Payer: Self-pay

## 2020-07-14 DIAGNOSIS — R911 Solitary pulmonary nodule: Secondary | ICD-10-CM

## 2020-07-15 ENCOUNTER — Telehealth: Payer: Self-pay | Admitting: Pulmonary Disease

## 2020-07-15 ENCOUNTER — Encounter: Payer: Self-pay | Admitting: Pulmonary Disease

## 2020-07-15 ENCOUNTER — Ambulatory Visit (INDEPENDENT_AMBULATORY_CARE_PROVIDER_SITE_OTHER): Payer: No Typology Code available for payment source | Admitting: Pulmonary Disease

## 2020-07-15 ENCOUNTER — Other Ambulatory Visit: Payer: Self-pay | Admitting: Emergency Medicine

## 2020-07-15 DIAGNOSIS — R911 Solitary pulmonary nodule: Secondary | ICD-10-CM

## 2020-07-15 DIAGNOSIS — J849 Interstitial pulmonary disease, unspecified: Secondary | ICD-10-CM

## 2020-07-15 LAB — PULMONARY FUNCTION TEST
DL/VA % pred: 89 %
DL/VA: 3.53 ml/min/mmHg/L
DLCO cor % pred: 66 %
DLCO cor: 18.25 ml/min/mmHg
DLCO unc % pred: 66 %
DLCO unc: 18.25 ml/min/mmHg
FEF 25-75 Post: 0.6 L/sec
FEF 25-75 Pre: 2.35 L/sec
FEF2575-%Change-Post: -74 %
FEF2575-%Pred-Post: 23 %
FEF2575-%Pred-Pre: 93 %
FEV1-%Change-Post: -16 %
FEV1-%Pred-Post: 67 %
FEV1-%Pred-Pre: 80 %
FEV1-Post: 2.31 L
FEV1-Pre: 2.76 L
FEV1FVC-%Change-Post: -8 %
FEV1FVC-%Pred-Pre: 98 %
FEV6-%Change-Post: -15 %
FEV6-%Pred-Post: 72 %
FEV6-%Pred-Pre: 86 %
FEV6-Post: 3.23 L
FEV6-Pre: 3.84 L
FEV6FVC-%Change-Post: -3 %
FEV6FVC-%Pred-Post: 102 %
FEV6FVC-%Pred-Pre: 106 %
FVC-%Change-Post: -8 %
FVC-%Pred-Post: 74 %
FVC-%Pred-Pre: 81 %
FVC-Post: 3.5 L
FVC-Pre: 3.84 L
Post FEV1/FVC ratio: 66 %
Post FEV6/FVC ratio: 96 %
Pre FEV1/FVC ratio: 72 %
Pre FEV6/FVC Ratio: 100 %
RV % pred: 75 %
RV: 2.01 L
TLC % pred: 76 %
TLC: 5.77 L

## 2020-07-15 NOTE — Patient Instructions (Signed)
Full PFT performed today. °

## 2020-07-15 NOTE — Telephone Encounter (Signed)
Order placed. Nothing further needed at this time. 

## 2020-07-15 NOTE — Progress Notes (Signed)
Full PFT performed today. °

## 2020-07-15 NOTE — Telephone Encounter (Signed)
I called and discussed with patient  Please order PET scan. I don't believe he can tolerate surgery due to IPF and low DLCO Will discuss with Drs. Byrum and Icard if a nav bronch can be arranged

## 2020-07-15 NOTE — Telephone Encounter (Signed)
Received call report from [facility] with [facility] on patient's CT Chest done on 07/14/20. Dr. Vaughan Browner please review the result/impression copied below:  IMPRESSION: 1. Interval enlargement of the bilobed nodule in the RIGHT lung base, in the RIGHT lower lobe concerning for neoplasm. When viewed in the sagittal plane this measures nearly 2 cm. Referral to multi disciplinary thoracic oncologic setting is suggested with PET or biopsy as warranted. 2. Mild background pulmonary emphysema. 3. LEFT adrenal adenoma. 4. Nephrolithiasis in the upper pole of the LEFT kidney largest measuring 6 mm. 5. Emphysema and aortic atherosclerosis.    Please advise, thank you.

## 2020-07-18 ENCOUNTER — Telehealth: Payer: Self-pay | Admitting: Pulmonary Disease

## 2020-07-18 NOTE — Telephone Encounter (Signed)
Called the pt and there was no answer- LMTCB    

## 2020-07-18 NOTE — Progress Notes (Signed)
Please let patient know his pre-bronchodilator lung function is stable from 2019 and diffusion capacity appear stable from 2018.

## 2020-07-19 NOTE — Telephone Encounter (Signed)
I scheduled this ENB yesterday and left a message for the patient on the [phone] to call back so I could give him his appt info and then I would schedule his covid test. I then called a 2nd time and left a voicemail asking pt to call again about his PET scan appt and his procedure appt. I did not get a response back.

## 2020-07-19 NOTE — Telephone Encounter (Signed)
I have called the patient and left a vm to see which date is good for him

## 2020-07-19 NOTE — Telephone Encounter (Signed)
I can do the afternoon of 3/23 or 3/25.   Alternatively could do 3/28, 3/29, 3/31 in the afternoon.   Thanks.

## 2020-07-19 NOTE — Telephone Encounter (Signed)
Spoke to [place] with pre service center, who stated that she contacted patient in preparation of bronch scheduled for 07/20/2020. Patient requested to cancel bronch because he was not made aware of this procedure.  I have spoken to patient, who confirmed that he was not made aware of procedure nor notified of date/time. Patient stated that tomorrow does not work with his schedule. He would like to discuss this with Dr. Vaughan Browner.  I spoke to Dyann Kief, RN, who stated that ENB was not listed on ENB spread sheet and she suggested that I reach out to [facility] regarding this matter.  I have spoken to Ruby, who stated that she would look into this.   Dr. Vaughan Browner, please advise. thanks

## 2020-07-19 NOTE — Telephone Encounter (Signed)
Called and spoke with pt about the info stated by Vallarie Mare. Pt said that he tried to call the office back about the missed call but was never able to speak with Vallarie Mare about the reason for her call.  I did provide pt with the date/time/location of the PET scan and he verbalized understanding and stated that he was going to be able to keep that appt.  Pt said that he was needing to have the bronch procedure cancelled as that date would not work but stated that he will get the procedure done but will need to have it done on a different date.  Dr.Byrum, please advise on another date for the bronch and then send to procedure pool.

## 2020-07-19 NOTE — Telephone Encounter (Signed)
Veronica from [facility] states patient would like to cancel Bronch procedure on 07/20/2020. Veronica phone number is [phone].

## 2020-07-19 NOTE — Telephone Encounter (Signed)
[place] to speak with Verdene Lennert but unable to reach. Left message for her to return call.

## 2020-07-20 ENCOUNTER — Ambulatory Visit (HOSPITAL_COMMUNITY): Admission: RE | Admit: 2020-07-20 | Payer: Medicare Other | Source: Home / Self Care | Admitting: Emergency Medicine

## 2020-07-20 ENCOUNTER — Encounter (HOSPITAL_COMMUNITY): Admission: RE | Payer: Self-pay | Source: Home / Self Care

## 2020-07-20 SURGERY — BRONCHOSCOPY, WITH FLUOROSCOPY
Anesthesia: General

## 2020-07-20 NOTE — Telephone Encounter (Signed)
Patrice or Leigh,   Please see if we can get the patient worked in to see me today or tomorrow? Otherwise can be worked in to see Dr. Lamonte Sakai next week to discuss biopsy.   Garner Nash, DO [facility] 07/20/2020 8:49 AM

## 2020-07-20 NOTE — Telephone Encounter (Signed)
I have called the patient trying to schedule the bronch and seems to be a lot of confusion I will speak to Lauren and have her contact the patient

## 2020-07-20 NOTE — Telephone Encounter (Signed)
Called and scheduled patient with Dr. Lamonte Sakai on 07/26/2020 -pr

## 2020-07-21 NOTE — Telephone Encounter (Signed)
OK we will review at the [facility]

## 2020-07-21 NOTE — Telephone Encounter (Signed)
Pt has appt Monday with Dr Lamonte Sakai I will await to schedule this after the appt

## 2020-07-26 ENCOUNTER — Other Ambulatory Visit: Payer: Self-pay

## 2020-07-26 ENCOUNTER — Encounter: Payer: Self-pay | Admitting: Emergency Medicine

## 2020-07-26 ENCOUNTER — Ambulatory Visit (INDEPENDENT_AMBULATORY_CARE_PROVIDER_SITE_OTHER): Payer: No Typology Code available for payment source | Admitting: Emergency Medicine

## 2020-07-26 VITALS — BP 124/76 | HR 77 | Temp 97.5°F | Ht 74.0 in | Wt 198.4 lb

## 2020-07-26 DIAGNOSIS — Z720 Tobacco use: Secondary | ICD-10-CM | POA: Diagnosis not present

## 2020-07-26 DIAGNOSIS — R911 Solitary pulmonary nodule: Secondary | ICD-10-CM | POA: Diagnosis not present

## 2020-07-26 NOTE — Progress Notes (Signed)
Subjective:    Patient ID: [name], male    DOB: [birth year], 75 y.o.   MRN: [id number]   HPI 75 year old active smoker (25 pack years), followed by Dr. Vaughan Browner for interstitial lung disease on OFEV, suspected coexisting obstruction.  Also with atrial fibrillation, CAD. He tells me that he is feeling well.   Patient has a bilobed right lower lobe pulmonary nodule, 2 cm, that has enlarged on serial CT scans of the chest.  He is referred for further evaluation of the nodule.  CT is as below.  He has a PET scan scheduled for 07/29/2020.  CT chest 07/15/2020 reviewed by me, shows a right basilar bilobed irregular nodule 1.7 x 1.2 cm that has enlarged compared with 04/12/2020.  Pulmonary function testing 07/15/2020 reviewed by me, shows restriction, possible superimposed obstruction without a bronchodilator response (airflows are actually worse after bronchodilator).  Decreased diffusion capacity that corrects to the normal range when adjusted for alveolar volume.   Review of Systems As per HPI  Past Medical History:  Diagnosis Date  . Arthritis   . Coronary artery calcification seen on CAT scan   . PAF (paroxysmal atrial fibrillation) ([facility] [place]) 08/02/2014  . PTSD (post-traumatic stress disorder)    controlled     Family History  Problem Relation Age of Onset  . Lung cancer Mother   . Kidney cancer Father   . Heart attack Maternal Grandfather   . Alcoholism Brother   . HIV Brother   . Anesthesia problems Neg Hx   . Hypotension Neg Hx   . Malignant hyperthermia Neg Hx   . Pseudochol deficiency Neg Hx      Social History   Socioeconomic History  . Marital status: Married    Spouse name: Not on file  . Number of children: Not on file  . Years of education: Not on file  . Highest education level: Not on file  Occupational History  . Not on file  Tobacco Use  . Smoking status: Current Every Day Smoker    Packs/day: 0.50    Years: 40.00    Pack years: 20.00    Types:  Cigarettes    Start date: 32  . Smokeless tobacco: Never Used  . Tobacco comment: 5-10 cigarettes/day as of 07/26/20 ARJ   Vaping Use  . Vaping Use: Never used  Substance and Sexual Activity  . Alcohol use: No  . Drug use: No  . Sexual activity: Yes    Comment: Viagra at times  Other Topics Concern  . Not on file  Social History Narrative  . 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 Cholecalciferol (VITAMIN D) 125 MCG (5000 UT) CAPS Take 5,000 Units by mouth daily.    . cyanocobalamin 500 MCG tablet Take 500 mcg by mouth daily.    Kendall Flack 575 MG/5ML SYRP Take 5 mLs by mouth daily.    . Menaquinone-7 (VITAMIN K2) 100 MCG CAPS Take 100 mg by mouth daily.    . Nintedanib (OFEV) 150 MG CAPS Take 1 capsule (150 mg  total) by mouth 2 (two) times daily. 60 capsule 5  . OVER THE COUNTER MEDICATION Apply 1 application topically daily. CBD Cream on hand    . Probiotic Product (PROBIOTIC DAILY) CAPS Take 842 mg by mouth daily.    . vitamin C (ASCORBIC ACID) 500 MG tablet Take 500 mg by mouth daily.    . Zinc 50 MG TABS Take 50 mg by mouth daily.     No facility-administered medications prior to visit.         Objective:   Physical Exam Vitals:   07/26/20 1529  BP: 124/76  Pulse: 77  Temp: (!) 97.5 F (36.4 C)  TempSrc: Temporal  SpO2: 97%  Weight: 198 lb 6.4 oz (90 kg)  Height: 6\' 2"  (1.88 m)   Gen: Pleasant, well-nourished, in no distress,  normal affect  ENT: No lesions,  mouth clear,  oropharynx clear, no postnasal drip  Neck: No JVD, no stridor  Lungs: No  use of accessory muscles, no crackles or wheezing on normal respiration, no wheeze on forced expiration  Cardiovascular: RRR, heart sounds normal, no murmur or gallops, no peripheral edema  Musculoskeletal: No deformities, no cyanosis or clubbing  Neuro: alert, awake, non focal  Skin: Warm, no lesions or rash      Assessment & Plan:  Tobacco abuse He is not interested in quitting at this time.  Right lower lobe pulmonary nodule Larger on most recent CT chest.  At least moderate to high suspicion for primary malignancy.  He has a PET scan planned for this week and we will review with him.  Even so based on suspicion I think he probably needs navigational bronchoscopy and biopsies.  I will work on setting up [place], goal for beginning of April.  Get your PET scan as planned.  We will review the results when available. We will work on scheduling a bronchoscopy to better evaluate your right lower lobe pulmonary nodule.  We will try to get this done in early April.  You will be contacted with detailed scheduling information.  You will also be contacted prior to the procedure regarding instructions for medications, eating, etc.  You will need a designated driver. Continue your other medications as you have been taking them Follow with Dr Lamonte Sakai in 1 month   Baltazar Apo, MD, PhD 07/26/2020, 4:30 PM [facility] [phone] or if no answer before 7:00PM call [phone] For any issues after 7:00PM please call eLink [phone]

## 2020-07-26 NOTE — Addendum Note (Signed)
Addended by: Collene Gobble on: 07/26/2020 05:23 PM   Modules accepted: Orders

## 2020-07-26 NOTE — Assessment & Plan Note (Signed)
Larger on most recent CT chest.  At least moderate to high suspicion for primary malignancy.  He has a PET scan planned for this week and we will review with him.  Even so based on suspicion I think he probably needs navigational bronchoscopy and biopsies.  I will work on setting up [place], goal for beginning of April.  Get your PET scan as planned.  We will review the results when available. We will work on scheduling a bronchoscopy to better evaluate your right lower lobe pulmonary nodule.  We will try to get this done in early April.  You will be contacted with detailed scheduling information.  You will also be contacted prior to the procedure regarding instructions for medications, eating, etc.  You will need a designated driver. Continue your other medications as you have been taking them Follow with Dr Lamonte Sakai in 1 month

## 2020-07-26 NOTE — Patient Instructions (Signed)
Get your PET scan as planned.  We will review the results when available. We will work on scheduling a bronchoscopy to better evaluate your right lower lobe pulmonary nodule.  We will try to get this done in early April.  You will be contacted with detailed scheduling information.  You will also be contacted prior to the procedure regarding instructions for medications, eating, etc.  You will need a designated driver. Continue your other medications as you have been taking them Follow with Dr Lamonte Sakai in 1 month

## 2020-07-26 NOTE — H&P (View-Only) (Signed)
Subjective:    Patient ID: [name], male    DOB: October 18, 1945, 75 y.o.   MRN: [id number]   HPI 75 year old active smoker (25 pack years), followed by Dr. Vaughan Browner for interstitial lung disease on OFEV, suspected coexisting obstruction.  Also with atrial fibrillation, CAD. He tells me that he is feeling well.   Patient has a bilobed right lower lobe pulmonary nodule, 2 cm, that has enlarged on serial CT scans of the chest.  He is referred for further evaluation of the nodule.  CT is as below.  He has a PET scan scheduled for 07/29/2020.  CT chest 07/15/2020 reviewed by me, shows a right basilar bilobed irregular nodule 1.7 x 1.2 cm that has enlarged compared with 04/12/2020.  Pulmonary function testing 07/15/2020 reviewed by me, shows restriction, possible superimposed obstruction without a bronchodilator response (airflows are actually worse after bronchodilator).  Decreased diffusion capacity that corrects to the normal range when adjusted for alveolar volume.   Review of Systems As per HPI  Past Medical History:  Diagnosis Date  . Arthritis   . Coronary artery calcification seen on CAT scan   . PAF (paroxysmal atrial fibrillation) ([place] [facility]) 08/02/2014  . PTSD (post-traumatic stress disorder)    controlled     Family History  Problem Relation Age of Onset  . Lung cancer Mother   . Kidney cancer Father   . Heart attack Maternal Grandfather   . Alcoholism Brother   . HIV Brother   . Anesthesia problems Neg Hx   . Hypotension Neg Hx   . Malignant hyperthermia Neg Hx   . Pseudochol deficiency Neg Hx      Social History   Socioeconomic History  . Marital status: Married    Spouse name: Not on file  . Number of children: Not on file  . Years of education: Not on file  . Highest education level: Not on file  Occupational History  . Not on file  Tobacco Use  . Smoking status: Current Every Day Smoker    Packs/day: 0.50    Years: 40.00    Pack years: 20.00    Types:  Cigarettes    Start date: 57  . Smokeless tobacco: Never Used  . Tobacco comment: 5-10 cigarettes/day as of 07/26/20 ARJ   Vaping Use  . Vaping Use: Never used  Substance and Sexual Activity  . Alcohol use: No  . Drug use: No  . Sexual activity: Yes    Comment: Viagra at times  Other Topics Concern  . Not on file  Social History Narrative  . 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 Cholecalciferol (VITAMIN D) 125 MCG (5000 UT) CAPS Take 5,000 Units by mouth daily.    . cyanocobalamin 500 MCG tablet Take 500 mcg by mouth daily.    Kendall Flack 575 MG/5ML SYRP Take 5 mLs by mouth daily.    . Menaquinone-7 (VITAMIN K2) 100 MCG CAPS Take 100 mg by mouth daily.    . Nintedanib (OFEV) 150 MG CAPS Take 1 capsule (150 mg  total) by mouth 2 (two) times daily. 60 capsule 5  . OVER THE COUNTER MEDICATION Apply 1 application topically daily. CBD Cream on hand    . Probiotic Product (PROBIOTIC DAILY) CAPS Take 842 mg by mouth daily.    . vitamin C (ASCORBIC ACID) 500 MG tablet Take 500 mg by mouth daily.    . Zinc 50 MG TABS Take 50 mg by mouth daily.     No facility-administered medications prior to visit.         Objective:   Physical Exam Vitals:   07/26/20 1529  BP: 124/76  Pulse: 77  Temp: (!) 97.5 F (36.4 C)  TempSrc: Temporal  SpO2: 97%  Weight: 198 lb 6.4 oz (90 kg)  Height: 6\' 2"  (1.88 m)   Gen: Pleasant, well-nourished, in no distress,  normal affect  ENT: No lesions,  mouth clear,  oropharynx clear, no postnasal drip  Neck: No JVD, no stridor  Lungs: No  use of accessory muscles, no crackles or wheezing on normal respiration, no wheeze on forced expiration  Cardiovascular: RRR, heart sounds normal, no murmur or gallops, no peripheral edema  Musculoskeletal: No deformities, no cyanosis or clubbing  Neuro: alert, awake, non focal  Skin: Warm, no lesions or rash      Assessment & Plan:  Tobacco abuse He is not interested in quitting at this time.  Right lower lobe pulmonary nodule Larger on most recent CT chest.  At least moderate to high suspicion for primary malignancy.  He has a PET scan planned for this week and we will review with him.  Even so based on suspicion I think he probably needs navigational bronchoscopy and biopsies.  I will work on setting up [place], goal for beginning of April.  Get your PET scan as planned.  We will review the results when available. We will work on scheduling a bronchoscopy to better evaluate your right lower lobe pulmonary nodule.  We will try to get this done in early April.  You will be contacted with detailed scheduling information.  You will also be contacted prior to the procedure regarding instructions for medications, eating, etc.  You will need a designated driver. Continue your other medications as you have been taking them Follow with Dr Lamonte Sakai in 1 month   Baltazar Apo, MD, PhD 07/26/2020, 4:30 PM [facility] [phone] or if no answer before 7:00PM call [phone] For any issues after 7:00PM please call eLink [phone]

## 2020-07-26 NOTE — Assessment & Plan Note (Signed)
He is not interested in quitting at this time.

## 2020-07-27 ENCOUNTER — Telehealth: Payer: Self-pay | Admitting: Emergency Medicine

## 2020-07-27 NOTE — Telephone Encounter (Signed)
I've got the disc. Thanks

## 2020-07-29 ENCOUNTER — Encounter (HOSPITAL_COMMUNITY)
Admission: RE | Admit: 2020-07-29 | Discharge: 2020-07-29 | Disposition: A | Discharge status: Home / Self Care | Payer: No Typology Code available for payment source | Source: Ambulatory Visit | Attending: Pulmonary Disease | Admitting: Pulmonary Disease

## 2020-07-29 ENCOUNTER — Other Ambulatory Visit: Payer: Self-pay

## 2020-07-29 DIAGNOSIS — R911 Solitary pulmonary nodule: Secondary | ICD-10-CM | POA: Insufficient documentation

## 2020-07-29 LAB — GLUCOSE, CAPILLARY: Glucose-Capillary: 84 mg/dL (ref 70–99)

## 2020-07-29 MED ORDER — FLUDEOXYGLUCOSE F - 18 (FDG) INJECTION
10.0000 | Freq: Once | INTRAVENOUS | Status: AC
  Administered 2020-07-29: 10 via INTRAVENOUS

## 2020-08-02 ENCOUNTER — Telehealth: Payer: Self-pay | Admitting: Pulmonary Disease

## 2020-08-02 NOTE — Telephone Encounter (Signed)
PET scan 3/29 reviewed with patient He has uptake in the lung nodule is scheduled for bronchoscopy next week  In addition he has uptake in the left gluteal region.  Per patient he had a surgery to remove a growth there 1.5 years ago at the [place] and was told that he had some kind of "a wart".  I have instructed him to follow back with the surgeon at the [facility] to reevaluate.  Marshell Garfinkel MD [facility] 08/02/2020, 10:28 AM

## 2020-08-05 ENCOUNTER — Other Ambulatory Visit (HOSPITAL_COMMUNITY): Payer: No Typology Code available for payment source

## 2020-08-05 ENCOUNTER — Encounter (HOSPITAL_COMMUNITY): Payer: Self-pay | Admitting: Emergency Medicine

## 2020-08-05 NOTE — Progress Notes (Signed)
[facility] [place] [facility]ans[facility]  PPM/ICD - NA   Chest x-ray -  EKG - DOS Stress Test - 2018 ECHO - 2018 Cardiac Cath - denies  Sleep Study - denies    COVID TEST- scheduled for 4/2   Anesthesia review:   Patient denies shortness of breath, fever, cough and chest pain at PAT appointment   All instructions explained to the patient, with a verbal understanding of the material.  Patient also instructed to self quarantine after being tested for COVID-19. The opportunity to ask questions was provided.

## 2020-08-06 ENCOUNTER — Other Ambulatory Visit (HOSPITAL_COMMUNITY)
Admission: RE | Admit: 2020-08-06 | Discharge: 2020-08-06 | Disposition: A | Discharge status: Home / Self Care | Payer: No Typology Code available for payment source | Source: Ambulatory Visit | Attending: Emergency Medicine | Admitting: Emergency Medicine

## 2020-08-06 DIAGNOSIS — R911 Solitary pulmonary nodule: Secondary | ICD-10-CM | POA: Diagnosis present

## 2020-08-06 DIAGNOSIS — C3431 Malignant neoplasm of lower lobe, right bronchus or lung: Secondary | ICD-10-CM | POA: Diagnosis not present

## 2020-08-06 DIAGNOSIS — Z20822 Contact with and (suspected) exposure to covid-19: Secondary | ICD-10-CM | POA: Diagnosis not present

## 2020-08-06 DIAGNOSIS — F17210 Nicotine dependence, cigarettes, uncomplicated: Secondary | ICD-10-CM | POA: Diagnosis not present

## 2020-08-06 LAB — SARS CORONAVIRUS 2 (TAT 6-24 HRS): SARS Coronavirus 2: NEGATIVE

## 2020-08-08 ENCOUNTER — Encounter (HOSPITAL_COMMUNITY)
Admission: RE | Disposition: A | Discharge status: Home / Self Care | Payer: Self-pay | Source: Home / Self Care | Attending: Emergency Medicine

## 2020-08-08 ENCOUNTER — Encounter (HOSPITAL_COMMUNITY): Payer: Self-pay | Admitting: Emergency Medicine

## 2020-08-08 ENCOUNTER — Other Ambulatory Visit: Payer: Self-pay

## 2020-08-08 ENCOUNTER — Ambulatory Visit (HOSPITAL_COMMUNITY)
Admission: RE | Admit: 2020-08-08 | Discharge: 2020-08-08 | Disposition: A | Discharge status: Home / Self Care | Payer: No Typology Code available for payment source | Attending: Emergency Medicine | Admitting: Emergency Medicine

## 2020-08-08 ENCOUNTER — Ambulatory Visit (HOSPITAL_COMMUNITY): Payer: No Typology Code available for payment source | Admitting: Anesthesiology

## 2020-08-08 ENCOUNTER — Ambulatory Visit (HOSPITAL_COMMUNITY): Payer: No Typology Code available for payment source

## 2020-08-08 DIAGNOSIS — Z20822 Contact with and (suspected) exposure to covid-19: Secondary | ICD-10-CM | POA: Diagnosis not present

## 2020-08-08 DIAGNOSIS — C3431 Malignant neoplasm of lower lobe, right bronchus or lung: Secondary | ICD-10-CM | POA: Diagnosis not present

## 2020-08-08 DIAGNOSIS — R911 Solitary pulmonary nodule: Secondary | ICD-10-CM | POA: Diagnosis present

## 2020-08-08 DIAGNOSIS — Z9889 Other specified postprocedural states: Secondary | ICD-10-CM

## 2020-08-08 DIAGNOSIS — F17210 Nicotine dependence, cigarettes, uncomplicated: Secondary | ICD-10-CM | POA: Insufficient documentation

## 2020-08-08 HISTORY — PX: VIDEO BRONCHOSCOPY WITH ENDOBRONCHIAL NAVIGATION: SHX6175

## 2020-08-08 HISTORY — PX: BRONCHIAL BRUSHINGS: SHX5108

## 2020-08-08 HISTORY — PX: BRONCHIAL BIOPSY: SHX5109

## 2020-08-08 HISTORY — DX: Personal history of urinary calculi: Z87.442

## 2020-08-08 HISTORY — PX: BRONCHIAL NEEDLE ASPIRATION BIOPSY: SHX5106

## 2020-08-08 HISTORY — PX: FIDUCIAL MARKER PLACEMENT: SHX6858

## 2020-08-08 HISTORY — DX: Cardiac arrhythmia, unspecified: I49.9

## 2020-08-08 LAB — CBC
HCT: 45.3 % (ref 39.0–52.0)
Hemoglobin: 14.1 g/dL (ref 13.0–17.0)
MCH: 26.5 pg (ref 26.0–34.0)
MCHC: 31.1 g/dL (ref 30.0–36.0)
MCV: 85.2 fL (ref 80.0–100.0)
Platelets: 178 10*3/uL (ref 150–400)
RBC: 5.32 MIL/uL (ref 4.22–5.81)
RDW: 16.6 % — ABNORMAL HIGH (ref 11.5–15.5)
WBC: 5.4 10*3/uL (ref 4.0–10.5)
nRBC: 0 % (ref 0.0–0.2)

## 2020-08-08 LAB — BASIC METABOLIC PANEL
Anion gap: 11 (ref 5–15)
BUN: 9 mg/dL (ref 8–23)
CO2: 23 mmol/L (ref 22–32)
Calcium: 10 mg/dL (ref 8.9–10.3)
Chloride: 103 mmol/L (ref 98–111)
Creatinine, Ser: 0.69 mg/dL (ref 0.61–1.24)
GFR, Estimated: >60 mL/min (ref >60)
Glucose, Bld: 99 mg/dL (ref 70–99)
Potassium: 4 mmol/L (ref 3.5–5.1)
Sodium: 137 mmol/L (ref 135–145)

## 2020-08-08 SURGERY — VIDEO BRONCHOSCOPY WITH ENDOBRONCHIAL NAVIGATION
Anesthesia: General | Laterality: Right

## 2020-08-08 MED ORDER — PROPOFOL 10 MG/ML IV BOLUS
INTRAVENOUS | Status: DC | PRN
  Administered 2020-08-08: 160 mg via INTRAVENOUS

## 2020-08-08 MED ORDER — LIDOCAINE 2% (20 MG/ML) 5 ML SYRINGE
INTRAMUSCULAR | Status: DC | PRN
  Administered 2020-08-08: 80 mg via INTRAVENOUS

## 2020-08-08 MED ORDER — ROCURONIUM BROMIDE 100 MG/10ML IV SOLN
INTRAVENOUS | Status: DC | PRN
  Administered 2020-08-08: 60 mg via INTRAVENOUS
  Administered 2020-08-08: 10 mg via INTRAVENOUS

## 2020-08-08 MED ORDER — SUGAMMADEX SODIUM 200 MG/2ML IV SOLN
INTRAVENOUS | Status: DC | PRN
  Administered 2020-08-08: 175 mg via INTRAVENOUS

## 2020-08-08 MED ORDER — FENTANYL CITRATE (PF) 100 MCG/2ML IJ SOLN
INTRAMUSCULAR | Status: DC | PRN
  Administered 2020-08-08 (×2): 50 ug via INTRAVENOUS

## 2020-08-08 MED ORDER — CHLORHEXIDINE GLUCONATE 0.12 % MT SOLN
OROMUCOSAL | Status: AC
  Administered 2020-08-08: 15 mL
  Filled 2020-08-08: qty 15

## 2020-08-08 MED ORDER — FENTANYL CITRATE (PF) 100 MCG/2ML IJ SOLN: 25.0000 ug | INTRAMUSCULAR | Status: DC | PRN

## 2020-08-08 MED ORDER — ONDANSETRON HCL 4 MG/2ML IJ SOLN
INTRAMUSCULAR | Status: DC | PRN
  Administered 2020-08-08: 4 mg via INTRAVENOUS

## 2020-08-08 MED ORDER — PHENYLEPHRINE 40 MCG/ML (10ML) SYRINGE FOR IV PUSH (FOR BLOOD PRESSURE SUPPORT)
PREFILLED_SYRINGE | INTRAVENOUS | Status: DC | PRN
  Administered 2020-08-08 (×4): 80 ug via INTRAVENOUS
  Administered 2020-08-08: 120 ug via INTRAVENOUS
  Administered 2020-08-08 (×2): 80 ug via INTRAVENOUS

## 2020-08-08 MED ORDER — LACTATED RINGERS IV SOLN: INTRAVENOUS | Status: DC | PRN

## 2020-08-08 MED ORDER — DEXAMETHASONE SODIUM PHOSPHATE 10 MG/ML IJ SOLN
INTRAMUSCULAR | Status: DC | PRN
  Administered 2020-08-08: 10 mg via INTRAVENOUS

## 2020-08-08 SURGICAL SUPPLY — 1 items: superlock fiducial marker ×12 IMPLANT

## 2020-08-08 NOTE — Op Note (Signed)
Video Bronchoscopy with Electromagnetic Navigation Procedure Note  Date of Operation: 08/08/2020  Pre-op Diagnosis: Right lower lobe pulmonary nodule  Post-op Diagnosis: Same  Surgeon: Baltazar Apo  Assistants: None  Anesthesia: General endotracheal anesthesia  Operation: Flexible video fiberoptic bronchoscopy with electromagnetic navigation and biopsies.  Estimated Blood Loss: Minimal  Complications: None apparent  Indications and History: [name] is a 75 y.o. male with history of tobacco use, interstitial lung disease.  He is found to have a slowly enlarging right lower lobe pulmonary nodule on serial CT imaging.  This had borderline hypermetabolism on subsequent PET scan.  Recommendation was made to achieve a tissue diagnosis via navigational bronchoscopy.  The risks, benefits, complications, treatment options and expected outcomes were discussed with the patient.  The possibilities of pneumothorax, pneumonia, reaction to medication, pulmonary aspiration, perforation of a viscus, bleeding, failure to diagnose a condition and creating a complication requiring transfusion or operation were discussed with the patient who freely signed the consent.    Description of Procedure: The patient was seen in the Preoperative Area, was examined and was deemed appropriate to proceed.  The patient was taken to [facility] endoscopy room 2, identified as [name] and the procedure verified as Flexible Video Fiberoptic Bronchoscopy.  A Time Out was held and the above information confirmed.   Prior to the date of the procedure a high-resolution CT scan of the chest was performed. Utilizing [place] a virtual tracheobronchial tree was generated to allow the creation of distinct navigation pathways to the patient's parenchymal abnormalities. After being taken to the operating room general anesthesia was initiated and the patient  was orally intubated. The video fiberoptic  bronchoscope was introduced via the endotracheal tube and a general inspection was performed which showed normal airways throughout.  There were no endobronchial lesions or abnormal secretions seen. The extendable working channel and locator guide were introduced into the bronchoscope. The distinct navigation pathways prepared prior to this procedure were then utilized to navigate to within 0.2 cm of patient's lesion identified on CT scan. The extendable working channel was secured into place and the locator guide was withdrawn. Under fluoroscopic guidance transbronchial brushings, transbronchial Wang needle biopsies, and transbronchial forceps biopsies were performed to be sent for cytology and pathology.  Under fluoroscopic guidance 3 fiducial markers were placed triangulating the lesion in the event that radiation therapy should be indicated going forward.  At the end of the procedure a general airway inspection was performed and there was no evidence of active bleeding. The bronchoscope was removed.  The patient tolerated the procedure well. There was no significant blood loss and there were no obvious complications. A post-procedural chest x-ray is pending.  Samples: 1. Transbronchial brushings from right lower lobe nodule 2. Transbronchial Wang needle biopsies from right lower lobe nodule 3. Transbronchial forceps biopsies from right lower lobe nodule  Plans:  The patient will be discharged from the PACU to home when recovered from anesthesia and after chest x-ray is reviewed. We will review the cytology, pathology results with the patient when they become available. Outpatient followup will be with Dr. Vaughan Browner or Dr. Lamonte Sakai.    Cyrstal Leitz S. 08/08/2020

## 2020-08-08 NOTE — Anesthesia Postprocedure Evaluation (Signed)
Anesthesia Post Note  Patient: [name]  Procedure(s) Performed: VIDEO BRONCHOSCOPY WITH ENDOBRONCHIAL NAVIGATION BRONCHIAL BIOPSIES BRONCHIAL BRUSHINGS BRONCHIAL NEEDLE ASPIRATION BIOPSIES FIDUCIAL MARKER PLACEMENT     Patient location during evaluation: PACU Anesthesia Type: General Level of consciousness: awake Pain management: pain level controlled Vital Signs Assessment: post-procedure vital signs reviewed and stable Respiratory status: spontaneous breathing Cardiovascular status: stable Postop Assessment: no apparent nausea or vomiting Anesthetic complications: no   No complications documented.  Last Vitals:  Vitals:   08/08/20 1047 08/08/20 1103  BP: 116/86 100/80  Pulse: 85 76  Resp: 17 20  Temp:  (!) 36.3 C  SpO2: 97% 97%    Last Pain:  Vitals:   08/08/20 1103  TempSrc:   PainSc: 0-No pain                 Ashonte Angelucci

## 2020-08-08 NOTE — Transfer of Care (Signed)
Immediate Anesthesia Transfer of Care Note  Patient: Caswell Corwin  Procedure(s) Performed: VIDEO BRONCHOSCOPY WITH ENDOBRONCHIAL NAVIGATION BRONCHIAL BIOPSIES BRONCHIAL BRUSHINGS BRONCHIAL NEEDLE ASPIRATION BIOPSIES FIDUCIAL MARKER PLACEMENT  Patient Location: PACU  Anesthesia Type:General  Level of Consciousness: drowsy  Airway & Oxygen Therapy: Patient Spontanous Breathing and Patient connected to face mask oxygen  Post-op Assessment: Report given to RN and Post -op Vital signs reviewed and stable  Post vital signs: Reviewed and stable  Last Vitals:  Vitals Value Taken Time  BP 120/75 08/08/20 1030  Temp    Pulse 79 08/08/20 1033  Resp 30 08/08/20 1033  SpO2 100 % 08/08/20 1033  Vitals shown include unvalidated device data.  Last Pain:  Vitals:   08/08/20 0705  TempSrc:   PainSc: 4          Complications: No complications documented.

## 2020-08-08 NOTE — Interval H&P Note (Signed)
History and Physical Interval Note:  08/08/2020 8:09 AM  [name]  has presented today for surgery, with the diagnosis of RIGHT LOWER LOBE NODULE.  The various methods of treatment have been discussed with the patient and family. After consideration of risks, benefits and other options for treatment, the patient has consented to Video bronchoscopy with electromagnetic navigation and biopsies as surgical intervention.  The patient's history has been reviewed, patient examined, no change in status, stable for surgery.  I have reviewed the patient's chart and labs.  Questions were answered to the patient's satisfaction.     Collene Gobble

## 2020-08-08 NOTE — Discharge Instructions (Signed)
Flexible Bronchoscopy, Care After This sheet gives you information about how to care for yourself after your test. Your doctor may also give you more specific instructions. If you have problems or questions, contact your doctor. Follow these instructions at home: Eating and drinking  Do not eat or drink anything (not even water) for 2 hours after your test, or until your numbing medicine (local anesthetic) wears off.  When your numbness is gone and your cough and gag reflexes have come back, you may: ? Eat only soft foods. ? Slowly drink liquids.  The day after the test, go back to your normal diet. Driving  Do not drive for 24 hours if you were given a medicine to help you relax (sedative).  Do not drive or use heavy machinery while taking prescription pain medicine. General instructions   Take over-the-counter and prescription medicines only as told by your doctor.  Return to your normal activities as told. Ask what activities are safe for you.  Do not use any products that have nicotine or tobacco in them. This includes cigarettes and e-cigarettes. If you need help quitting, ask your doctor.  Keep all follow-up visits as told by your doctor. This is important. It is very important if you had a tissue sample (biopsy) taken. Get help right away if:  You have shortness of breath that gets worse.  You get light-headed.  You feel like you are going to pass out (faint).  You have chest pain.  You cough up: ? More than a little blood. ? More blood than before. Summary  Do not eat or drink anything (not even water) for 2 hours after your test, or until your numbing medicine wears off.  Do not use cigarettes. Do not use e-cigarettes.  Get help right away if you have chest pain.  Please call our office for any questions or concerns.  [phone].  This information is not intended to replace advice given to you by your health care provider. Make sure you discuss any  questions you have with your health care provider. Document Released: 02/18/2009 Document Revised: 04/05/2017 Document Reviewed: 05/11/2016 Elsevier Patient Education  2020 [employer].

## 2020-08-08 NOTE — Anesthesia Procedure Notes (Signed)
Procedure Name: Intubation Date/Time: 08/08/2020 8:49 AM Performed by: Gwyndolyn Saxon, CRNA Pre-anesthesia Checklist: Patient identified, Emergency Drugs available, Suction available and Patient being monitored Patient Re-evaluated:Patient Re-evaluated prior to induction Oxygen Delivery Method: Circle system utilized Preoxygenation: Pre-oxygenation with 100% oxygen Induction Type: IV induction Ventilation: Mask ventilation without difficulty and Oral airway inserted - appropriate to patient size Laryngoscope Size: Mac and 4 Grade View: Grade I Tube type: Oral Tube size: 8.5 mm Number of attempts: 1 Airway Equipment and Method: Stylet Placement Confirmation: ETT inserted through vocal cords under direct vision,  positive ETCO2 and breath sounds checked- equal and bilateral Secured at: 26 (per surgeon request) cm Tube secured with: Tape Dental Injury: Teeth and Oropharynx as per pre-operative assessment

## 2020-08-08 NOTE — Anesthesia Preprocedure Evaluation (Addendum)
Anesthesia Evaluation  Patient identified by MRN, date of birth, ID band  Reviewed: Allergy & Precautions, NPO status , Patient's Chart, lab work & pertinent test results  Airway Mallampati: II  TM Distance: >3 FB     Dental   Pulmonary pneumonia, Current Smoker and Patient abstained from smoking.,    breath sounds clear to auscultation       Cardiovascular + CAD  + dysrhythmias Atrial Fibrillation  Rhythm:Regular Rate:Normal     Neuro/Psych    GI/Hepatic negative GI ROS, Neg liver ROS,   Endo/Other    Renal/GU negative Renal ROS     Musculoskeletal  (+) Arthritis ,   Abdominal   Peds  Hematology  (+) anemia ,   Anesthesia Other Findings   Reproductive/Obstetrics                           Anesthesia Physical Anesthesia Plan  ASA: III  Anesthesia Plan: General   Post-op Pain Management:    Induction: Intravenous  PONV Risk Score and Plan: 2 and Ondansetron, Dexamethasone and Midazolam  Airway Management Planned: Oral ETT  Additional Equipment:   Intra-op Plan:   Post-operative Plan: Possible Post-op intubation/ventilation  Informed Consent: I have reviewed the patients History and Physical, chart, labs and discussed the procedure including the risks, benefits and alternatives for the proposed anesthesia with the patient or authorized representative who has indicated his/her understanding and acceptance.     Dental advisory given  Plan Discussed with: Anesthesiologist and CRNA  Anesthesia Plan Comments:        Anesthesia Quick Evaluation

## 2020-08-09 LAB — CYTOLOGY - NON PAP

## 2020-08-10 ENCOUNTER — Encounter (HOSPITAL_COMMUNITY): Payer: Self-pay | Admitting: Emergency Medicine

## 2020-08-10 ENCOUNTER — Telehealth: Payer: Self-pay | Admitting: Emergency Medicine

## 2020-08-11 ENCOUNTER — Telehealth: Payer: Self-pay | Admitting: Emergency Medicine

## 2020-08-11 DIAGNOSIS — R911 Solitary pulmonary nodule: Secondary | ICD-10-CM

## 2020-08-11 NOTE — Telephone Encounter (Signed)
Spoke with the patient and reviewed his biopsy results.  Consistent with carcinoma, does not look like they were able to specify any more specifically than that based on the amount of material available.  I do believe he would probably be a surgical candidate unless there are barriers that are brought on by his interstitial lung disease.  His pulmonary function testing would suggest that he could tolerate.  I will send him to [place].  Will need to be approved by the [place]

## 2020-08-11 NOTE — Telephone Encounter (Signed)
Nothing noted in message. Will close encounter.  

## 2020-08-19 ENCOUNTER — Other Ambulatory Visit: Payer: Self-pay | Admitting: *Deleted

## 2020-08-19 NOTE — Progress Notes (Signed)
The proposed treatment discussed in cancer conference is for discussion purpose only and is not a binding recommendation.  The patient was not physically examined nor present for their treatment options.  Therefore, final treatment plans cannot be decided.   Waiting for [facility] approval to be seen with oncology here.

## 2020-08-23 ENCOUNTER — Encounter: Payer: Self-pay | Admitting: *Deleted

## 2020-08-29 ENCOUNTER — Telehealth: Payer: Self-pay | Admitting: Genetic Counselor

## 2020-08-29 NOTE — Telephone Encounter (Signed)
Created in error

## 2020-08-30 ENCOUNTER — Encounter: Payer: Self-pay | Admitting: *Deleted

## 2020-08-30 NOTE — Progress Notes (Signed)
I received referral on [name] 2 weeks ago but this was not authorized with the [place].  Yesterday received an update that [facility].  No need to schedule patient with community oncology at this time.

## 2020-09-06 ENCOUNTER — Ambulatory Visit (INDEPENDENT_AMBULATORY_CARE_PROVIDER_SITE_OTHER): Payer: No Typology Code available for payment source | Admitting: Emergency Medicine

## 2020-09-06 ENCOUNTER — Encounter: Payer: Self-pay | Admitting: Emergency Medicine

## 2020-09-06 ENCOUNTER — Other Ambulatory Visit: Payer: Self-pay

## 2020-09-06 DIAGNOSIS — R911 Solitary pulmonary nodule: Secondary | ICD-10-CM | POA: Diagnosis not present

## 2020-09-06 NOTE — Progress Notes (Signed)
Subjective:    Patient ID: [name], male    DOB: 1946-04-05, 75 y.o.   MRN: [id number]   HPI 75 year old active smoker (25 pack years), followed by Dr. Vaughan Browner for interstitial lung disease on OFEV, suspected coexisting obstruction.  Also with atrial fibrillation, CAD. He tells me that he is feeling well.   Patient has a bilobed right lower lobe pulmonary nodule, 2 cm, that has enlarged on serial CT scans of the chest.  He is referred for further evaluation of the nodule.  CT is as below.  He has a PET scan scheduled for 07/29/2020.  CT chest 07/15/2020 reviewed by me, shows a right basilar bilobed irregular nodule 1.7 x 1.2 cm that has enlarged compared with 04/12/2020.  Pulmonary function testing 07/15/2020 reviewed by me, shows restriction, possible superimposed obstruction without a bronchodilator response (airflows are actually worse after bronchodilator).  Decreased diffusion capacity that corrects to the normal range when adjusted for alveolar volume.  ROV 09/06/20 --follow-up visit for [name], 75 year old active smoker with a history of interstitial lung disease on OFEV, atrial fibrillation, CAD.  He underwent navigational bronchoscopy 08/08/2020 for a lobulated right lower lobe pulmonary nodule that showed carcinoma. He is planning to see Oncology at the [facility] tomorrow. I believe that he is a candidate for resection should he been deemed a candidate.   PET scan 07/30/2020 reviewed by me shows mild hypermetabolism SUV 2.8 in the pulmonary nodule, no evidence of metastasis or distant disease.   Review of Systems As per HPI  Past Medical History:  Diagnosis Date  . Arthritis   . Coronary artery calcification seen on CAT scan   . Dysrhythmia   . History of kidney stones   . PAF (paroxysmal atrial fibrillation) ([place]) 08/02/2014  . PTSD (post-traumatic stress disorder)    controlled     Family History  Problem Relation Age of Onset  . Lung cancer Mother   . Kidney cancer  Father   . Heart attack Maternal Grandfather   . Alcoholism Brother   . HIV Brother   . Anesthesia problems Neg Hx   . Hypotension Neg Hx   . Malignant hyperthermia Neg Hx   . Pseudochol deficiency Neg Hx      Social History   Socioeconomic History  . Marital status: Married    Spouse name: Not on file  . Number of children: Not on file  . Years of education: Not on file  . Highest education level: Not on file  Occupational History  . Not on file  Tobacco Use  . Smoking status: Current Every Day Smoker    Packs/day: 0.50    Years: 40.00    Pack years: 20.00    Types: Cigarettes    Start date: 26  . Smokeless tobacco: Never Used  . Tobacco comment: 4-5 cigarettes smoke daily 09/06/20 [name]   Vaping Use  . Vaping Use: Never used  Substance and Sexual Activity  . Alcohol use: No  . Drug use: No  . Sexual activity: Yes    Comment: Viagra at times  Other Topics Concern  . Not on file  Social History Narrative  . 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 Cholecalciferol (VITAMIN D) 125 MCG (5000 UT) CAPS Take 5,000 Units by mouth daily.    . cyanocobalamin 500 MCG tablet Take 500 mcg by mouth daily.    [name] 575 MG/5ML SYRP Take 5 mLs by mouth daily.    . Menaquinone-7 (VITAMIN K2) 100 MCG CAPS Take 100 mg by mouth daily.    . Nintedanib (OFEV) 150 MG CAPS Take 1 capsule (150 mg total) by mouth 2 (two) times daily. 60 capsule 5  . OVER THE COUNTER MEDICATION Apply 1 application topically daily. CBD Cream on hand    . Probiotic Product (PROBIOTIC DAILY) CAPS  Take 842 mg by mouth daily.    . vitamin C (ASCORBIC ACID) 500 MG tablet Take 500 mg by mouth daily.    . Zinc 50 MG TABS Take 50 mg by mouth daily.     No facility-administered medications prior to visit.         Objective:   Physical Exam Vitals:   09/06/20 1454  BP: 124/82  Pulse: 75  Temp: 98 F (36.7 C)  TempSrc: Temporal  SpO2: 97%  Weight: 195 lb 12.8 oz (88.8 kg)  Height: 6\' 2"  (1.88 m)   Gen: Pleasant, well-nourished, in no distress,  normal affect  ENT: No lesions,  mouth clear,  oropharynx clear, no postnasal drip  Neck: No JVD, no stridor  Lungs: No use of accessory muscles, no crackles or wheezing on normal respiration, no wheeze on forced expiration  Cardiovascular: RRR, heart sounds normal, no murmur or gallops, no peripheral edema  Musculoskeletal: No deformities, no cyanosis or clubbing  Neuro: alert, awake, non focal  Skin: Warm, no lesions or rash      Assessment & Plan:  Right lower lobe pulmonary nodule PET scan without any evidence of metastatic disease.  The low SUV on PET as well as the pathological characteristics of carcinoma argue against small cell lung cancer.  I think that he is a candidate for primary resection, likely has stage I disease.  He supposed to see oncology at the [facility] tomorrow.  Long discussion, 45 minutes, with him and his wife today about the diagnosis, options for treatment and next steps.     Baltazar Apo, MD, PhD 09/06/2020, 3:31 PM [facility] [phone] or if no answer before 7:00PM call [phone] For any issues after 7:00PM please call eLink [phone]

## 2020-09-06 NOTE — Assessment & Plan Note (Signed)
PET scan without any evidence of metastatic disease.  The low SUV on PET as well as the pathological characteristics of carcinoma argue against small cell lung cancer.  I think that he is a candidate for primary resection, likely has stage I disease.  He supposed to see oncology at the [facility] tomorrow.  Long discussion, 45 minutes, with him and his wife today about the diagnosis, options for treatment and next steps.

## 2020-09-06 NOTE — Patient Instructions (Addendum)
We discussed your bronchoscopy results today. 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  No other characterization was able to be made.  You have been given a copy of your pathology report to take with you to the [place] Your pulmonary function testing is acceptable for you to be considered as a possible candidate for primary resection if this is the recommendation of oncology. Follow with Dr Lamonte Sakai if needed Follow-up Dr. Vaughan Browner in 3 months

## 2020-09-15 ENCOUNTER — Encounter: Payer: Medicare Other | Admitting: Thoracic Surgery (Cardiothoracic Vascular Surgery)

## 2020-09-27 ENCOUNTER — Other Ambulatory Visit: Payer: Self-pay

## 2020-09-27 ENCOUNTER — Encounter: Payer: Self-pay | Admitting: Thoracic Surgery (Cardiothoracic Vascular Surgery)

## 2020-09-27 ENCOUNTER — Institutional Professional Consult (permissible substitution) (INDEPENDENT_AMBULATORY_CARE_PROVIDER_SITE_OTHER): Payer: No Typology Code available for payment source | Admitting: Thoracic Surgery (Cardiothoracic Vascular Surgery)

## 2020-09-27 VITALS — BP 126/70 | HR 69 | Resp 20 | Ht 74.0 in | Wt 190.0 lb

## 2020-09-27 DIAGNOSIS — R911 Solitary pulmonary nodule: Secondary | ICD-10-CM | POA: Diagnosis not present

## 2020-09-27 NOTE — Progress Notes (Signed)
PCP is [facility] Referring Provider is Callie Fielding, MD  Chief Complaint  Patient presents with  . 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  [name] is a 75 year old man with a past history significant for PTSD, ILD, pulmonary fibrosis, paroxysmal atrial fibrillation, arthritis, and kidney stones.  He has been on Ofev for ILD.  He is followed with high resolution CT scans.  In December he had a right lower lobe lung nodule.  A follow-up CT in March showed that had increased in size.  A PET/CT showed mild hypermetabolic activity with no evidence of regional or distant metastatic disease.  Dr. Lamonte Sakai did a navigational bronchoscopy.  Biopsy showed carcinoma although further characterization was not possible.  He was diagnosed with ILD about 4 years ago.  Despite that he is active.  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  He also plays tennis.  He is not having any chest pain, pressure, or tightness.  He does get short of breath with exertion.  His appetite is good but he has lost about 20 pounds over the past 3 months.  He does bruise easily.  He has PTSD from being a [place]. Zubrod Score: At the time of surgery this patient's most appropriate activity status/level should be described as: []     0    Normal activity, no symptoms [x]     1    Restricted in physical strenuous activity but ambulatory, able to do out light work []     2    Ambulatory and capable of self care, unable to do work activities, up and about >50 % of waking hours                              []     3    Only limited self care, in bed greater than 50% of waking hours []     4    Completely disabled, no self care, confined to bed or chair []     5    Moribund  Past Medical History:  Diagnosis Date  . Arthritis   .  Coronary artery calcification seen on CAT scan   . Dysrhythmia   . History of kidney stones   . PAF (paroxysmal atrial fibrillation) ([place]) 08/02/2014  . PTSD (post-traumatic stress disorder)    controlled    Past Surgical History:  Procedure Laterality Date  . BACK SURGERY     2000  . BRONCHIAL BIOPSY  08/08/2020   Procedure: BRONCHIAL BIOPSIES;  Surgeon: Collene Gobble, MD;  Location: [facility] ENDOSCOPY;  Service: Cardiopulmonary;;  . BRONCHIAL BRUSHINGS  08/08/2020   Procedure: BRONCHIAL BRUSHINGS;  Surgeon: Collene Gobble, MD;  Location: [place] [facility];  Service: Cardiopulmonary;;  . BRONCHIAL NEEDLE ASPIRATION BIOPSY  08/08/2020   Procedure: BRONCHIAL NEEDLE ASPIRATION BIOPSIES;  Surgeon: Collene Gobble, MD;  Location: [facility] ENDOSCOPY;  Service: Cardiopulmonary;;  . CARPAL TUNNEL RELEASE Right   . FIDUCIAL MARKER PLACEMENT  08/08/2020   Procedure: FIDUCIAL MARKER PLACEMENT;  Surgeon: Collene Gobble, MD;  Location: [facility] ENDOSCOPY;  Service: Cardiopulmonary;;  . JOINT REPLACEMENT      left 2005  . SHOULDER ARTHROSCOPY W/ ROTATOR CUFF REPAIR  2012   right  . TOTAL  KNEE ARTHROPLASTY  08/28/2011   Procedure: TOTAL KNEE ARTHROPLASTY;  Surgeon: Garald Balding, MD;  Location: [place];  Service: Orthopedics;  Laterality: Right;  [name] VIDEO BRONCHOSCOPY WITH ENDOBRONCHIAL NAVIGATION  08/08/2020   Procedure: VIDEO BRONCHOSCOPY WITH ENDOBRONCHIAL NAVIGATION;  Surgeon: Collene Gobble, MD;  Location: [facility] ENDOSCOPY;  Service: Cardiopulmonary;;    Family History  Problem Relation Age of Onset  . Lung cancer Mother   . Kidney cancer Father   . Heart attack Maternal Grandfather   . Alcoholism Brother   . HIV Brother   . Anesthesia problems Neg Hx   . Hypotension Neg Hx   . Malignant hyperthermia Neg Hx   . Pseudochol deficiency Neg Hx     Social History Social History   Tobacco Use  . Smoking status: Current Every Day Smoker    Packs/day: 0.50    Years: 40.00    Pack years: 20.00    Types: Cigarettes     Start date: 79  . Smokeless tobacco: Never Used  . Tobacco comment: 4-5 cigarettes smoke daily 09/06/20 [facility]   Vaping Use  . Vaping Use: Never used  Substance Use Topics  . Alcohol use: No  . Drug use: No    Current Outpatient Medications  Medication Sig Dispense Refill  . Cholecalciferol (VITAMIN D) 125 MCG (5000 UT) CAPS Take 5,000 Units by mouth daily.    . cyanocobalamin 500 MCG tablet Take 500 mcg by mouth daily.    Kendall Flack 575 MG/5ML SYRP Take 5 mLs by mouth daily.    . Menaquinone-7 (VITAMIN K2) 100 MCG CAPS Take 100 mg by mouth daily.    . Nintedanib (OFEV) 150 MG CAPS Take 1 capsule (150 mg total) by mouth 2 (two) times daily. 60 capsule 5  . OVER THE COUNTER MEDICATION Apply 1 application topically daily. CBD Cream on hand    . Probiotic Product (PROBIOTIC DAILY) CAPS Take 842 mg by mouth daily.    . vitamin C (ASCORBIC ACID) 500 MG tablet Take 500 mg by mouth daily.    . Zinc 50 MG TABS Take 50 mg by mouth daily.     No current facility-administered medications for this visit.    No Known Allergies  Review of Systems  Constitutional: Positive for unexpected weight change (20 pound weight loss last 3 months). Negative for activity change and appetite change.  HENT: Positive for dental problem (dentures). Negative for trouble swallowing and voice change.   Eyes: Positive for visual disturbance.  Respiratory: Positive for shortness of breath. Negative for chest tightness and wheezing.   Cardiovascular: Negative for chest pain and leg swelling.       History of atrial fibrillation.  Claudication  Gastrointestinal: Negative.   Genitourinary: Positive for frequency. Negative for dysuria.  Musculoskeletal: Positive for arthralgias. Negative for gait problem.  Neurological: Positive for numbness. Negative for seizures and weakness.  Hematological: Negative for adenopathy. Bruises/bleeds easily.  Psychiatric/Behavioral: The patient is nervous/anxious.   All other  systems reviewed and are negative.   BP 126/70 (BP Location: Left Arm, Patient Position: Sitting, Cuff Size: Normal)   Pulse 69   Resp 20   Ht 6\' 2"  (1.88 m)   Wt 190 lb (86.2 kg)   SpO2 97% Comment: RA  BMI 24.39 kg/m  Physical Exam Vitals reviewed.  Constitutional:      General: He is not in acute distress.    Appearance: Normal appearance.  HENT:     Head: Normocephalic and atraumatic.  Eyes:     General: No scleral icterus.    Extraocular Movements: Extraocular movements intact.  Cardiovascular:     Rate and Rhythm: Normal rate and regular rhythm.     Heart sounds: Normal heart sounds. No murmur heard. No friction rub. No gallop.   Pulmonary:     Effort: Pulmonary effort is normal. No respiratory distress.     Breath sounds: Rales (Bibasilar) present. No wheezing.  Abdominal:     General: There is no distension.     Palpations: Abdomen is soft.     Tenderness: There is no abdominal tenderness.  Musculoskeletal:        General: No swelling.     Cervical back: Neck supple.  Lymphadenopathy:     Cervical: No cervical adenopathy.  Skin:    General: Skin is warm and dry.  Neurological:     General: No focal deficit present.     Mental Status: He is alert and oriented to person, place, and time.     Cranial Nerves: No cranial nerve deficit.     Motor: No weakness.     Gait: Gait normal.    Diagnostic Tests: CT CHEST WITHOUT CONTRAST  TECHNIQUE: Multidetector CT imaging of the chest was performed using thin slice collimation for electromagnetic bronchoscopy planning purposes, without intravenous contrast.  COMPARISON:  April 12, 2020.  FINDINGS: Cardiovascular: Heart size is normal. Scattered atherosclerotic plaque in the thoracic aorta which is nonaneurysmal. The stable mild cardiac enlargement without pericardial effusion. Central pulmonary vasculature is normal caliber. Limited assessment of cardiovascular structures given lack of intravenous  contrast.  Mediastinum/Nodes: No adenopathy in the chest. Esophagus mildly patulous.  Lungs/Pleura: Interstitial lung disease with redemonstration of pulmonary nodule in the RIGHT lung base. This appears bilobed and is enlarged since previous imaging measuring approximately 1.7 x 1.2 cm. The area measured before excluded an area of added density seen anterior to the dominant nodule. This includes this area. When measured similar to the prior study this area measures 12 x 12 mm which is also enlarged. Measured in the sagittal plane this measures nearly 2 cm at 1.9 cm and is clearly larger than on the previous study. Mild background pulmonary emphysema is similar to previous imaging. Airways are patent. No consolidation or sign of pleural effusion.  Upper Abdomen: Incidental imaging of upper abdominal contents shows no acute process. Thickening of LEFT adrenal measuring 2 x 1.4 cm is stable.  Spleen, pancreas, imaged portions of liver and gastrointestinal tract are unremarkable.  Nephrolithiasis in the upper pole of the LEFT kidney largest measuring 6 mm.  Musculoskeletal: Spinal degenerative changes. No acute or destructive bone process.  IMPRESSION: 1. Interval enlargement of the bilobed nodule in the RIGHT lung base, in the RIGHT lower lobe concerning for neoplasm. When viewed in the sagittal plane this measures nearly 2 cm. Referral to multi disciplinary thoracic oncologic setting is suggested with PET or biopsy as warranted. 2. Mild background pulmonary emphysema. 3. LEFT adrenal adenoma. 4. Nephrolithiasis in the upper pole of the LEFT kidney largest measuring 6 mm. 5. Emphysema and aortic atherosclerosis.  These results will be called to the ordering clinician or representative by the Radiologist Assistant, and communication documented in the PACS or [employer].  Aortic Atherosclerosis (ICD10-I70.0) and Emphysema (ICD10-J43.9).   Electronically  Signed   By: Zetta Bills M.D.   On: 07/15/2020 12:36 NUCLEAR MEDICINE PET SKULL BASE TO THIGH  TECHNIQUE: 10.0 mCi F-18 FDG was injected intravenously. Full-ring PET imaging was performed  from the skull base to thigh after the radiotracer. CT data was obtained and used for attenuation correction and anatomic localization.  Fasting blood glucose: 87 mg/dl  COMPARISON:  CT chest dated 07/14/2020  FINDINGS: Mediastinal blood pool activity: SUV max 1.6  Liver activity: SUV max NA  NECK: No hypermetabolic cervical lymphadenopathy.  Incidental CT findings: none  CHEST: 2.0 x 1.2 cm bilobed nodule in the right lung base (series 8/image 60), corresponding to the CT abnormality, max SUV 2.8.  Subpleural reticulation/fibrosis in the lungs bilaterally, lower lobe predominant, suggesting chronic interstitial lung disease with a UIP pattern. Superimposed mild paraseptal emphysematous changes, upper lung predominant.  No hypermetabolic thoracic lymphadenopathy.  Incidental CT findings: Mild atherosclerotic calcifications aortic root/arch. Mild coronary atherosclerosis of the LAD.  ABDOMEN/PELVIS: No hypermetabolic abdominopelvic lymphadenopathy.  Mild low-density nodular thickening of the left adrenal gland (series 4/image 125), non FDG avid, compatible with benign adrenal adenomas.  No abnormal hypermetabolism in the liver, spleen, pancreas, or right adrenal gland.  Incidental CT findings: Nonobstructing bilateral renal calculi measuring up to 9 mm (series 4/image 132). No ureteral or bladder calculi. No hydronephrosis.  Thick-walled bladder, although underdistended, suggesting chronic bladder outlet obstruction. Prostatomegaly, suggesting BPH, with associated dystrophic calcifications.  Sigmoid diverticulosis, without associated diverticulitis. Atherosclerotic calcifications the abdominal aorta and branch vessels.  SKELETON: No focal hypermetabolic  activity to suggest skeletal metastasis.  Curvilinear tract in the left buttock/inferior gluteal region (series 4/images 215 and 217), with associated hypermetabolism, max SUV 6.5. Correlate for cutaneous fistula/abscess. This does not appear contiguous with the anorectal region.  Incidental CT findings: Degenerative changes of the visualized thoracolumbar spine. Postsurgical changes involving the lower lumbar spine.  IMPRESSION: 2.0 cm bilobed nodule at the right lung base, suspicious for primary bronchogenic neoplasm.  No findings suspicious for metastatic disease.  Curvilinear tract in the left buttock/inferior gluteal region, correlate for cutaneous fistula/abscess. This does not appear contiguous with the anorectal region.  Benign left adrenal adenomas.  Additional ancillary findings as above.   Electronically Signed   By: Julian Hy M.D.   On: 07/30/2020 08:20 I personally reviewed the CT and PET/CT images.  There is a bilobed nodule that increased in size from December to March.  Measures about 1.9 cm in greatest dimension.  It is mildly hypermetabolic on PET/CT.  Background of ILD and pulmonary fibrosis.  Pulmonary function testing 07/15/2020 FVC 3.84 (81%) FEV1 2.76 (80%) FEV1 2.31 (67%) postbronchodilator TLC and RV 75% predicted DLCO 18.25 (66%)  Impression: [name] is a 75 year old man with a past history significant for PTSD, ILD, pulmonary fibrosis, paroxysmal atrial fibrillation, arthritis, and kidney stones.  He was found to have a lung nodule in December.  On follow-up CT that nodule increased in size.  There was mild hypermetabolic activity on PET/CT.  Navigational bronchoscopy revealed carcinoma.  Most likely non-small cell based on appearance.  Clinical stage is T1, N0.  He does have interstitial lung disease.  He is on Ofev for that.  Despite that he remains very active.  He plays tennis on a regular basis and still works full-time.   He has adequate pulmonary function to tolerate resection based on his PFTs from March of this year.  The tumor is peripheral and amenable to segmentectomy or possibly even a wedge resection depending on intraoperative findings.  I discussed potential surgical resection with Mr. and Mrs. Delcastillo.  I informed them of the general nature of the procedure including the need for general anesthesia, the incisions to  be used, the use of a drainage tube postoperatively, the expected hospital stay, and the overall recovery.  I informed them of the indications, risks, benefits, and alternatives.  They understand that stereotactic radiation is an alternative treatment.  They understand the risks of surgery include but not limited to death, MI, DVT, PE, bleeding, infection, respiratory failure, cardiac arrhythmias, prolonged air leak, as well as possibility of other unforeseeable complications.  He understands the risks of surgery.  He wishes to talk it over with his wife before making a final decision.  Coronary calcification on CT-no anginal symptoms despite being very active.   Plan: Robotic right VATS for segmentectomy versus wedge resection.  Patient will call to schedule.  Melrose Nakayama, MD [facility] [phone]

## 2021-03-10 ENCOUNTER — Other Ambulatory Visit: Payer: Self-pay

## 2021-03-10 ENCOUNTER — Other Ambulatory Visit (INDEPENDENT_AMBULATORY_CARE_PROVIDER_SITE_OTHER): Payer: No Typology Code available for payment source

## 2021-03-10 ENCOUNTER — Ambulatory Visit (INDEPENDENT_AMBULATORY_CARE_PROVIDER_SITE_OTHER): Payer: No Typology Code available for payment source | Admitting: Pulmonary Disease

## 2021-03-10 ENCOUNTER — Encounter: Payer: Self-pay | Admitting: Pulmonary Disease

## 2021-03-10 VITALS — BP 116/70 | HR 60 | Temp 97.6°F | Ht 74.0 in | Wt 186.4 lb

## 2021-03-10 DIAGNOSIS — C3490 Malignant neoplasm of unspecified part of unspecified bronchus or lung: Secondary | ICD-10-CM | POA: Insufficient documentation

## 2021-03-10 DIAGNOSIS — J84112 Idiopathic pulmonary fibrosis: Secondary | ICD-10-CM | POA: Diagnosis not present

## 2021-03-10 DIAGNOSIS — Z5181 Encounter for therapeutic drug level monitoring: Secondary | ICD-10-CM

## 2021-03-10 DIAGNOSIS — C3432 Malignant neoplasm of lower lobe, left bronchus or lung: Secondary | ICD-10-CM

## 2021-03-10 LAB — COMPREHENSIVE METABOLIC PANEL
ALT: 58 U/L — ABNORMAL HIGH (ref 0–53)
AST: 46 U/L — ABNORMAL HIGH (ref 0–37)
Albumin: 3.8 g/dL (ref 3.5–5.2)
Alkaline Phosphatase: 141 U/L — ABNORMAL HIGH (ref 39–117)
BUN: 10 mg/dL (ref 6–23)
CO2: 30 mEq/L (ref 19–32)
Calcium: 8.3 mg/dL — ABNORMAL LOW (ref 8.4–10.5)
Chloride: 93 mEq/L — ABNORMAL LOW (ref 96–112)
Creatinine, Ser: 0.64 mg/dL (ref 0.40–1.50)
GFR: 92.89 mL/min (ref >60.00)
Glucose, Bld: 92 mg/dL (ref 70–99)
Potassium: 4 mEq/L (ref 3.5–5.1)
Sodium: 123 mEq/L — ABNORMAL LOW (ref 135–145)
Total Bilirubin: 1.5 mg/dL — ABNORMAL HIGH (ref 0.2–1.2)
Total Protein: 6.3 g/dL (ref 6.0–8.3)

## 2021-03-10 LAB — CBC WITH DIFFERENTIAL/PLATELET
Basophils Absolute: 0.1 10*3/uL (ref 0.0–0.1)
Basophils Relative: 0.8 % (ref 0.0–3.0)
Eosinophils Absolute: 0 10*3/uL (ref 0.0–0.7)
Eosinophils Relative: 0.4 % (ref 0.0–5.0)
HCT: 44.3 % (ref 39.0–52.0)
Hemoglobin: 14.6 g/dL (ref 13.0–17.0)
Lymphocytes Relative: 10.4 % — ABNORMAL LOW (ref 12.0–46.0)
Lymphs Abs: 0.7 10*3/uL (ref 0.7–4.0)
MCHC: 32.9 g/dL (ref 30.0–36.0)
MCV: 84.7 fl (ref 78.0–100.0)
Monocytes Absolute: 0.6 10*3/uL (ref 0.1–1.0)
Monocytes Relative: 9 % (ref 3.0–12.0)
Neutro Abs: 5.4 10*3/uL (ref 1.4–7.7)
Neutrophils Relative %: 79.4 % — ABNORMAL HIGH (ref 43.0–77.0)
Platelets: 166 10*3/uL (ref 150.0–400.0)
RBC: 5.23 Mil/uL (ref 4.22–5.81)
RDW: 15.9 % — ABNORMAL HIGH (ref 11.5–15.5)
WBC: 6.8 10*3/uL (ref 4.0–10.5)

## 2021-03-10 NOTE — Patient Instructions (Signed)
Continue the Ofev We will get labs today include comprehensive metabolic panel, CBC  Follow-up in 3 months

## 2021-03-10 NOTE — Addendum Note (Signed)
Addended by: Vanessa Barbara on: 03/10/2021 03:48 PM   Modules accepted: Orders

## 2021-03-10 NOTE — Progress Notes (Signed)
[name]    [id number]    Feb 14, 1946  Primary Care Physician:[facility]  Referring Physician: [facility] [address] [place],  [place] [address]  Chief complaint: Follow-up for IPF Started ofev July 2019 Small cell lung cancer diagnosed 2022  HPI: [name] is a 75 year old with past medical history of atrial fibrillation, coronary artery disease, tobacco use. He had a CT scan of the chest for evaluation of dyspnea which showed pulmonary fibrosis. He also had PFTs which showed restriction and diffusion defect concerning for interstitial lung disease. He has been sent here for further evaluation  His chief complaint is dyspnea on exertion, climbing status. He denies dyspnea at rest, cough, sputum production, wheezing. His only on aspirin and vitamin D. However he takes multiple over-the-counter naturals and herbals including protandim, B12, omega-3, turmeric among others.   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 He is exposed to [place] in [place] Smoking history: Half pack per day for 25 years. He still continues to smoke. ILD questionnaire 02/03/18-has a humidifier at home otherwise negative for exposures, occupational history, medication history.  Interim history: He had navigational bronchoscopy done on 08/08/2020 after PET scan for enlarging right lower lobe lung nodule by Dr. Lamonte Sakai.  Pathology showed carcinoma but not enough cells to make a definitive diagnosis.  He was evaluated by Dr. Roxan Hockey and offered robotic resection but he was not sure she wanted to go through with that  He had a second opinion at [facility] with follow-up imaging on 11/18/2020 which showed increased size of lung nodule with new nodule in the right major fissure and new  subcarinal and hilar lymphadenopathy concerning for progression of disease.  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  Chemotherapy +/- immunotherapy was recommended but after discussion with family he did not want to go through with this  He is currently getting holistic therapy for treatment of cancer Continues on Ofev without any issue  Outpatient Encounter Medications as of 03/10/2021  Medication Sig   Cholecalciferol (VITAMIN D) 125 MCG (5000 UT) CAPS Take 5,000 Units by mouth daily.   cyanocobalamin 500 MCG tablet Take 500 mcg by mouth daily.   Elderberry 575 MG/5ML SYRP Take 5 mLs by mouth daily.   Menaquinone-7 (VITAMIN K2) 100 MCG CAPS Take 100 mg by mouth daily.   Nintedanib (OFEV) 150 MG CAPS Take 1 capsule (150 mg total) by mouth 2 (two) times daily.   OVER THE COUNTER MEDICATION Apply 1 application topically daily. CBD Cream on hand   Probiotic Product (PROBIOTIC DAILY) CAPS Take 842 mg by mouth daily.   vitamin C (ASCORBIC ACID) 500 MG tablet Take 500 mg by mouth daily.   Zinc 50 MG TABS Take 50 mg by mouth daily.   No facility-administered encounter medications on file as of 03/10/2021.   Physical Exam: Blood pressure 116/70, pulse 60, temperature 97.6 F (36.4 C), temperature source Oral, height 6\' 2"  (1.88 m), weight 186 lb 6.4 oz (84.6 kg), SpO2 100 %. Gen:      No acute distress HEENT:  EOMI, sclera anicteric Neck:     No masses; no thyromegaly Lungs:    Bibasal crackles CV:         Regular rate and rhythm;  no murmurs Abd:      + bowel sounds; soft, non-tender; no palpable masses, no distension Ext:    No edema; adequate peripheral perfusion Skin:      Warm and dry; no rash Neuro: alert and oriented x 3 Psych: normal mood and affect   Data Reviewed: Imaging CT angio chest 08/21/16-no evidence of pulmonary emboli, mild atherosclerosis, coronary artery calcification. Retuicalar markings  which have basilar predominant. No clear evidence of honeycombing. No consolidation, lung infiltrate. This appears to have progressed slightly from 2016.  High res CT 09/03/17 Basal predominant fibrosis, traction bronchiectasis.  No honeycombing Upper lobe paraseptal emphysema. I have reviewed the images personally.  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  No suspicion for metastatic disease I have reviewed the images personally  PFTs 09/27/16 FVC 4.08 (84%), FEV1 3.23 (90%), F/F 79, TLC 70%, DLCO 52% Mild restriction with moderate-severe DLCO impairment. Small airway disease with improvement in flow rates postbronchodilator  Unable to complete repeat PFTs in spite of multiple attempts.  6-minute walk test 10/03/17 390 m Starting heart rate, stats 78, 97% Ending heart rate, sats 103, 94%  Outside records Imaging from the [place] Chest x-ray 05/20/17 Chronic lung changes, bibasilar scarring  CT chest 05/20/17  Widespread subpleural reticulation greatest in the bases, groundglass opacities at the bases, sub pleural cystic changes in the upper aspect of the lung Scattered nodular opacities-10 mm in the right lower lobe, 6 mm the left lung apex, 7 mm in the right lung apex. Left adrenal adenoma.  Labs: Hypersensitivity profile 10/04/16-negative Connective tissue serology 10/04/16-ANA, CCP, rheumatoid factor, CRP, aldolase-negative CK 443 SCL70, Jo1 10/09/17-negative  Assessment:  New diagnosis of metastatic small cell cancer He has gotten a second opinion at [facility] and offered chemotherapy plus minus immunotherapy but he did not want not to go through with that Currently getting holistic therapy and he is hopeful that his disease will be under control with this  IPF CT scan shows basal predominant reticular opacities that is clearly progressive from 2016. 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 He does not have any significant exposures except for agent orange. There is no exposure to asbestos. He does not have signs and symptoms of connective tissue disease or autoimmune process. We had an extensive discussion with him regarding the possible etiologies of pulmonary fibrosis and the treatment options.  Started on Ofev through the [place].  He is tolerating it well except for occasional diarrhea and indigestion He is unable to complete PFTs in spite of multiple attempts Get labs today for monitoring He is due to get CT follow-up at the [place] later this month and I will obtain these images for our review   Plan/Recommendations: - Continue Ofev - Check hepatic function panel. - Follow-up CT to be done at [facility] 03/10/2021, 3:31 PM  CC: [facility]

## 2021-03-16 ENCOUNTER — Encounter: Payer: Self-pay | Admitting: *Deleted

## 2021-03-16 NOTE — Progress Notes (Signed)
I called [name] to get an update on his wishes for treatment here at [facility].  He states right now he is seeing medical oncology at [facility] but if he needs chemo then he wants to come here. He states he will call with an update next week.

## 2021-05-26 ENCOUNTER — Telehealth: Payer: Self-pay | Admitting: Pulmonary Disease

## 2021-05-26 DIAGNOSIS — J84112 Idiopathic pulmonary fibrosis: Secondary | ICD-10-CM

## 2021-05-26 MED ORDER — OFEV 150 MG PO CAPS: 150.0000 mg | ORAL_CAPSULE | Freq: Two times a day (BID) | ORAL | 5 refills | Status: DC

## 2021-05-26 NOTE — Telephone Encounter (Signed)
Refill for Ofev 150mg  sent to [facility]  Most recent LFTs on 05/09/21 per [facility]   Last seen by Dr. Vaughan Browner on 03/10/21 with plan to continue Ofev  Knox Saliva, PharmD, MPH, BCPS Clinical Pharmacist (Rheumatology and Pulmonology)

## 2021-08-30 ENCOUNTER — Encounter: Payer: Self-pay | Admitting: Pulmonary Disease

## 2021-08-30 ENCOUNTER — Ambulatory Visit (INDEPENDENT_AMBULATORY_CARE_PROVIDER_SITE_OTHER): Payer: No Typology Code available for payment source | Admitting: Pulmonary Disease

## 2021-08-30 VITALS — BP 138/74 | HR 114 | Temp 97.9°F | Ht 74.0 in | Wt 179.2 lb

## 2021-08-30 DIAGNOSIS — Z5181 Encounter for therapeutic drug level monitoring: Secondary | ICD-10-CM | POA: Diagnosis not present

## 2021-08-30 DIAGNOSIS — J84112 Idiopathic pulmonary fibrosis: Secondary | ICD-10-CM

## 2021-08-30 NOTE — Progress Notes (Signed)
? ?      [name]    [id number]    03-03-1946 ? ?Primary Care Physician:[facility] ? ?Referring Physician: [facility] ?[place] ?[place],   [id number] ? ?Chief complaint: Follow-up for IPF ?Started ofev July 2019 ?Small cell lung cancer diagnosed 2022 ? ?HPI: ?[name] is a 76 year old with past medical history of atrial fibrillation, coronary artery disease, tobacco use. He had a CT scan of the chest for evaluation of dyspnea which showed pulmonary fibrosis. He also had PFTs which showed restriction and diffusion defect concerning for interstitial lung disease. He has been sent here for further evaluation ? ?His chief complaint is dyspnea on exertion, climbing status. He denies dyspnea at rest, cough, sputum production, wheezing. His only on aspirin and vitamin D. However he takes multiple over-the-counter naturals and herbals including protandim, B12, omega-3, turmeric among others.  ? ?He had navigational bronchoscopy done on 08/08/2020 after PET scan for enlarging right lower lobe lung nodule by Dr. Lamonte Sakai.  Pathology showed carcinoma but not enough cells to make a definitive diagnosis.  He was evaluated by Dr. Roxan Hockey and offered robotic resection but he did not want that  ? ?He had a second opinion at [facility] with follow-up imaging on 11/18/2020 which showed increased size of lung nodule with new nodule in the right major fissure and new subcarinal and hilar lymphadenopathy concerning for progression of disease.  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  Chemotherapy +/- immunotherapy was recommended but after discussion with family he did not want to go through with this ? 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 He is exposed to [place] in [place] ?Smoking history: Half pack per day for 25 years. He still continues to smoke. ?ILD questionnaire 02/03/18-has a humidifier at home otherwise negative for exposures, occupational history, medication history. ? ?Interim history: ?He was on holistic therapy for cancer but unfortunately the malignancy has continued to spread to the bones and liver.  He has ultimately started chemotherapy a few months ago at [facility] and has received 4 sessions ? ?The oncologist recommended 2 more sessions but since he had significant side effects such as fatigue with the chemotherapy  ? ?Outpatient Encounter Medications as of 08/30/2021  ?Medication Sig  ? Cholecalciferol (VITAMIN D) 125 MCG (5000 UT) CAPS Take 5,000 Units by mouth daily.  ? cyanocobalamin 500 MCG tablet Take 500 mcg by mouth daily.  ? Elderberry 575 MG/5ML SYRP Take 5 mLs by mouth daily.  ? Menaquinone-7 (VITAMIN K2) 100 MCG CAPS Take 100 mg by mouth daily.  ? Nintedanib (OFEV) 150 MG CAPS Take 1 capsule (150 mg total) by mouth 2 (two) times daily.  ? OVER THE COUNTER MEDICATION Apply 1 application topically daily. CBD Cream on hand  ? Probiotic Product (PROBIOTIC DAILY) CAPS Take 842 mg by mouth daily.  ? vitamin C (ASCORBIC ACID) 500 MG tablet Take 500 mg by mouth daily.  ? Zinc 50 MG TABS Take 50 mg by mouth daily.  ? ?No facility-administered encounter medications on file as of 08/30/2021.  ? ?Physical Exam: ?Blood pressure 138/74, pulse (!) 114, temperature 97.9 ?F (36.6 ?C), temperature source Oral, height 6\' 2"  (1.88 m), weight 179 lb 3.2 oz (81.3 kg), SpO2 95 %. ?Gen:  No acute distress ?HEENT:  EOMI, sclera anicteric ?Neck:     No masses; no thyromegaly ?Lungs:    Clear to auscultation bilaterally; normal respiratory effort ?CV:         Regular rate and rhythm; no murmurs ?Abd:      + bowel sounds; soft, non-tender; no palpable masses, no distension ?Ext:    No edema; adequate peripheral  perfusion ?Skin:      Warm and dry; no rash ?Neuro: alert and oriented x 3 ?Psych: normal mood and affect  ? ?Data Reviewed: ?Imaging ?CT angio chest 08/21/16-no evidence of pulmonary emboli, mild atherosclerosis, coronary artery calcification. Retuicalar markings which have basilar predominant. No clear evidence of honeycombing. No consolidation, lung infiltrate. This appears to have progressed slightly from 2016. ? ?High res CT 09/03/17 ?Basal predominant fibrosis, traction bronchiectasis.  No honeycombing ?Upper lobe paraseptal emphysema. ?I have reviewed the images personally. ? 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 ?PET scan 07/29/2020-PET avid 2 cm nodule in the right lung base.  No suspicion for metastatic disease ?I have reviewed the images personally ? ?PFTs 09/27/16 ?FVC 4.08 (84%), FEV1 3.23 (90%), F/F 79, TLC 70%, DLCO 52% ?Mild restriction with moderate-severe DLCO impairment. ?Small airway disease with improvement in flow rates postbronchodilator ? ?Unable to complete repeat PFTs in spite of multiple attempts. ? ?6-minute walk test 10/03/17 ?390 m ?Starting heart rate, stats 78, 97% ?Ending heart rate, sats 103, 94% ? ?Labs: ?Hypersensitivity profile 10/04/16-negative ?Connective tissue serology 10/04/16-ANA, CCP, rheumatoid factor, CRP, aldolase-negative ?CK 443 ?SCL70, Jo1 10/09/17-negative ? ?Assessment:  ?Metastatic small cell cancer ?He has finally started chemotherapy after multiple opinions.  Follow-up with [facility] oncology with surveillance scans ? ?IPF ?CT scan shows basal predominant reticular opacities that is clearly progressive from 2016. 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 He does not have any significant exposures except for agent orange. There is no exposure to asbestos. He does not have signs and symptoms of connective tissue disease or autoimmune process.  ? ?Started on Ofev through the [place].  He is tolerating it well except for occasional  diarrhea and indigestion ?He is unable to complete PFTs in spite of multiple attempts ?Get labs today for monitoring ? ?Plan/Recommendations: ?- Continue Ofev ?- Check hepatic function panel. ? ?Marshell Garfinkel MD ?[facility] ?08/30/2021, 3:57 PM ? ?CC: [facility] ? ? ?

## 2021-08-30 NOTE — Patient Instructions (Signed)
I am glad you are doing well with your breathing and tolerating the Ofev ?I hope you have good response to the chemotherapy ?We will order comprehensive metabolic panel  ? ?Follow-up in 6 months. ?

## 2021-09-23 ENCOUNTER — Other Ambulatory Visit: Payer: Self-pay

## 2021-09-23 ENCOUNTER — Emergency Department (HOSPITAL_BASED_OUTPATIENT_CLINIC_OR_DEPARTMENT_OTHER): Payer: No Typology Code available for payment source

## 2021-09-23 ENCOUNTER — Inpatient Hospital Stay (HOSPITAL_BASED_OUTPATIENT_CLINIC_OR_DEPARTMENT_OTHER)
Admission: EM | Admit: 2021-09-23 | Discharge: 2021-09-28 | DRG: 208 | Disposition: A | Discharge status: Hospice | Payer: No Typology Code available for payment source | Attending: Family Medicine | Admitting: Family Medicine

## 2021-09-23 ENCOUNTER — Encounter (HOSPITAL_BASED_OUTPATIENT_CLINIC_OR_DEPARTMENT_OTHER): Payer: Self-pay | Admitting: Emergency Medicine

## 2021-09-23 DIAGNOSIS — L89153 Pressure ulcer of sacral region, stage 3: Secondary | ICD-10-CM | POA: Diagnosis present

## 2021-09-23 DIAGNOSIS — Z87442 Personal history of urinary calculi: Secondary | ICD-10-CM

## 2021-09-23 DIAGNOSIS — I272 Pulmonary hypertension, unspecified: Secondary | ICD-10-CM | POA: Diagnosis present

## 2021-09-23 DIAGNOSIS — J810 Acute pulmonary edema: Secondary | ICD-10-CM

## 2021-09-23 DIAGNOSIS — R778 Other specified abnormalities of plasma proteins: Secondary | ICD-10-CM

## 2021-09-23 DIAGNOSIS — C7951 Secondary malignant neoplasm of bone: Secondary | ICD-10-CM | POA: Diagnosis present

## 2021-09-23 DIAGNOSIS — G9349 Other encephalopathy: Secondary | ICD-10-CM | POA: Diagnosis present

## 2021-09-23 DIAGNOSIS — Z801 Family history of malignant neoplasm of trachea, bronchus and lung: Secondary | ICD-10-CM

## 2021-09-23 DIAGNOSIS — I4891 Unspecified atrial fibrillation: Secondary | ICD-10-CM | POA: Diagnosis present

## 2021-09-23 DIAGNOSIS — Z87891 Personal history of nicotine dependence: Secondary | ICD-10-CM

## 2021-09-23 DIAGNOSIS — Z20822 Contact with and (suspected) exposure to covid-19: Secondary | ICD-10-CM | POA: Diagnosis present

## 2021-09-23 DIAGNOSIS — J189 Pneumonia, unspecified organism: Secondary | ICD-10-CM | POA: Diagnosis present

## 2021-09-23 DIAGNOSIS — R338 Other retention of urine: Secondary | ICD-10-CM | POA: Diagnosis present

## 2021-09-23 DIAGNOSIS — Z515 Encounter for palliative care: Secondary | ICD-10-CM

## 2021-09-23 DIAGNOSIS — E876 Hypokalemia: Secondary | ICD-10-CM | POA: Diagnosis present

## 2021-09-23 DIAGNOSIS — J8410 Pulmonary fibrosis, unspecified: Secondary | ICD-10-CM | POA: Diagnosis present

## 2021-09-23 DIAGNOSIS — Z811 Family history of alcohol abuse and dependence: Secondary | ICD-10-CM

## 2021-09-23 DIAGNOSIS — J90 Pleural effusion, not elsewhere classified: Secondary | ICD-10-CM

## 2021-09-23 DIAGNOSIS — L89321 Pressure ulcer of left buttock, stage 1: Secondary | ICD-10-CM | POA: Diagnosis present

## 2021-09-23 DIAGNOSIS — I5033 Acute on chronic diastolic (congestive) heart failure: Secondary | ICD-10-CM | POA: Diagnosis present

## 2021-09-23 DIAGNOSIS — Z66 Do not resuscitate: Secondary | ICD-10-CM

## 2021-09-23 DIAGNOSIS — E872 Acidosis, unspecified: Secondary | ICD-10-CM | POA: Diagnosis present

## 2021-09-23 DIAGNOSIS — Z7901 Long term (current) use of anticoagulants: Secondary | ICD-10-CM

## 2021-09-23 DIAGNOSIS — C7931 Secondary malignant neoplasm of brain: Secondary | ICD-10-CM

## 2021-09-23 DIAGNOSIS — C3490 Malignant neoplasm of unspecified part of unspecified bronchus or lung: Secondary | ICD-10-CM | POA: Diagnosis present

## 2021-09-23 DIAGNOSIS — D62 Acute posthemorrhagic anemia: Secondary | ICD-10-CM | POA: Diagnosis present

## 2021-09-23 DIAGNOSIS — L89312 Pressure ulcer of right buttock, stage 2: Secondary | ICD-10-CM | POA: Diagnosis present

## 2021-09-23 DIAGNOSIS — Z8249 Family history of ischemic heart disease and other diseases of the circulatory system: Secondary | ICD-10-CM

## 2021-09-23 DIAGNOSIS — J9621 Acute and chronic respiratory failure with hypoxia: Principal | ICD-10-CM | POA: Diagnosis present

## 2021-09-23 DIAGNOSIS — J849 Interstitial pulmonary disease, unspecified: Secondary | ICD-10-CM | POA: Diagnosis present

## 2021-09-23 DIAGNOSIS — I2510 Atherosclerotic heart disease of native coronary artery without angina pectoris: Secondary | ICD-10-CM | POA: Diagnosis present

## 2021-09-23 DIAGNOSIS — K5791 Diverticulosis of intestine, part unspecified, without perforation or abscess with bleeding: Secondary | ICD-10-CM | POA: Diagnosis present

## 2021-09-23 DIAGNOSIS — E871 Hypo-osmolality and hyponatremia: Secondary | ICD-10-CM | POA: Diagnosis present

## 2021-09-23 DIAGNOSIS — G9340 Encephalopathy, unspecified: Secondary | ICD-10-CM | POA: Diagnosis present

## 2021-09-23 DIAGNOSIS — L8990 Pressure ulcer of unspecified site, unspecified stage: Secondary | ICD-10-CM | POA: Insufficient documentation

## 2021-09-23 DIAGNOSIS — Z79899 Other long term (current) drug therapy: Secondary | ICD-10-CM

## 2021-09-23 DIAGNOSIS — R0902 Hypoxemia: Principal | ICD-10-CM

## 2021-09-23 DIAGNOSIS — C787 Secondary malignant neoplasm of liver and intrahepatic bile duct: Secondary | ICD-10-CM | POA: Diagnosis present

## 2021-09-23 DIAGNOSIS — Z96651 Presence of right artificial knee joint: Secondary | ICD-10-CM | POA: Diagnosis present

## 2021-09-23 DIAGNOSIS — J9601 Acute respiratory failure with hypoxia: Secondary | ICD-10-CM | POA: Diagnosis present

## 2021-09-23 DIAGNOSIS — F4310 Post-traumatic stress disorder, unspecified: Secondary | ICD-10-CM

## 2021-09-23 DIAGNOSIS — N390 Urinary tract infection, site not specified: Secondary | ICD-10-CM | POA: Diagnosis present

## 2021-09-23 DIAGNOSIS — I420 Dilated cardiomyopathy: Secondary | ICD-10-CM | POA: Diagnosis present

## 2021-09-23 DIAGNOSIS — Z8051 Family history of malignant neoplasm of kidney: Secondary | ICD-10-CM

## 2021-09-23 DIAGNOSIS — I480 Paroxysmal atrial fibrillation: Secondary | ICD-10-CM | POA: Diagnosis present

## 2021-09-23 DIAGNOSIS — R578 Other shock: Secondary | ICD-10-CM | POA: Diagnosis not present

## 2021-09-23 HISTORY — DX: Malignant (primary) neoplasm, unspecified: C80.1

## 2021-09-23 LAB — CBC
HCT: 43.9 % (ref 39.0–52.0)
Hemoglobin: 14.2 g/dL (ref 13.0–17.0)
MCH: 25.8 pg — ABNORMAL LOW (ref 26.0–34.0)
MCHC: 32.3 g/dL (ref 30.0–36.0)
MCV: 79.7 fL — ABNORMAL LOW (ref 80.0–100.0)
Platelets: 184 10*3/uL (ref 150–400)
RBC: 5.51 MIL/uL (ref 4.22–5.81)
RDW: 20.2 % — ABNORMAL HIGH (ref 11.5–15.5)
WBC: 10.2 10*3/uL (ref 4.0–10.5)
nRBC: 0.2 % (ref 0.0–0.2)

## 2021-09-23 LAB — BASIC METABOLIC PANEL
Anion gap: 8 (ref 5–15)
BUN: 17 mg/dL (ref 8–23)
CO2: 25 mmol/L (ref 22–32)
Calcium: 8.2 mg/dL — ABNORMAL LOW (ref 8.9–10.3)
Chloride: 96 mmol/L — ABNORMAL LOW (ref 98–111)
Creatinine, Ser: 0.53 mg/dL — ABNORMAL LOW (ref 0.61–1.24)
GFR, Estimated: >60 mL/min (ref >60)
Glucose, Bld: 140 mg/dL — ABNORMAL HIGH (ref 70–99)
Potassium: 3.8 mmol/L (ref 3.5–5.1)
Sodium: 129 mmol/L — ABNORMAL LOW (ref 135–145)

## 2021-09-23 LAB — TROPONIN I (HIGH SENSITIVITY): Troponin I (High Sensitivity): 39 ng/L — ABNORMAL HIGH (ref <18)

## 2021-09-23 LAB — BRAIN NATRIURETIC PEPTIDE: B Natriuretic Peptide: 287.6 pg/mL — ABNORMAL HIGH (ref 0.0–100.0)

## 2021-09-23 MED ORDER — DILTIAZEM HCL-DEXTROSE 125-5 MG/125ML-% IV SOLN (PREMIX)
5.0000 mg/h | INTRAVENOUS | Status: DC
  Administered 2021-09-23: 5 mg/h via INTRAVENOUS
  Filled 2021-09-23: qty 125

## 2021-09-23 MED ORDER — DILTIAZEM LOAD VIA INFUSION
10.0000 mg | Freq: Once | INTRAVENOUS | Status: DC
Start: 2021-09-23 — End: 2021-09-25

## 2021-09-23 NOTE — ED Triage Notes (Signed)
SOB x 1 hour. O2 sats at home in the 80's. Hx of lung CA and pulmonary fibrosis.

## 2021-09-24 ENCOUNTER — Encounter (HOSPITAL_BASED_OUTPATIENT_CLINIC_OR_DEPARTMENT_OTHER): Payer: Self-pay | Admitting: Emergency Medicine

## 2021-09-24 ENCOUNTER — Inpatient Hospital Stay (HOSPITAL_COMMUNITY): Payer: No Typology Code available for payment source

## 2021-09-24 DIAGNOSIS — D62 Acute posthemorrhagic anemia: Secondary | ICD-10-CM | POA: Diagnosis present

## 2021-09-24 DIAGNOSIS — J849 Interstitial pulmonary disease, unspecified: Secondary | ICD-10-CM

## 2021-09-24 DIAGNOSIS — I5033 Acute on chronic diastolic (congestive) heart failure: Secondary | ICD-10-CM

## 2021-09-24 DIAGNOSIS — E871 Hypo-osmolality and hyponatremia: Secondary | ICD-10-CM | POA: Diagnosis present

## 2021-09-24 DIAGNOSIS — L8990 Pressure ulcer of unspecified site, unspecified stage: Secondary | ICD-10-CM | POA: Insufficient documentation

## 2021-09-24 DIAGNOSIS — C3490 Malignant neoplasm of unspecified part of unspecified bronchus or lung: Secondary | ICD-10-CM | POA: Diagnosis present

## 2021-09-24 DIAGNOSIS — J9621 Acute and chronic respiratory failure with hypoxia: Secondary | ICD-10-CM | POA: Diagnosis present

## 2021-09-24 DIAGNOSIS — N390 Urinary tract infection, site not specified: Secondary | ICD-10-CM | POA: Diagnosis present

## 2021-09-24 DIAGNOSIS — Z66 Do not resuscitate: Secondary | ICD-10-CM | POA: Diagnosis present

## 2021-09-24 DIAGNOSIS — R338 Other retention of urine: Secondary | ICD-10-CM | POA: Diagnosis present

## 2021-09-24 DIAGNOSIS — G9340 Encephalopathy, unspecified: Secondary | ICD-10-CM | POA: Diagnosis not present

## 2021-09-24 DIAGNOSIS — K5791 Diverticulosis of intestine, part unspecified, without perforation or abscess with bleeding: Secondary | ICD-10-CM | POA: Diagnosis present

## 2021-09-24 DIAGNOSIS — Z20822 Contact with and (suspected) exposure to covid-19: Secondary | ICD-10-CM | POA: Diagnosis present

## 2021-09-24 DIAGNOSIS — I272 Pulmonary hypertension, unspecified: Secondary | ICD-10-CM | POA: Diagnosis present

## 2021-09-24 DIAGNOSIS — J9601 Acute respiratory failure with hypoxia: Secondary | ICD-10-CM | POA: Diagnosis present

## 2021-09-24 DIAGNOSIS — L89153 Pressure ulcer of sacral region, stage 3: Secondary | ICD-10-CM | POA: Diagnosis present

## 2021-09-24 DIAGNOSIS — I480 Paroxysmal atrial fibrillation: Secondary | ICD-10-CM | POA: Diagnosis present

## 2021-09-24 DIAGNOSIS — C7951 Secondary malignant neoplasm of bone: Secondary | ICD-10-CM | POA: Diagnosis present

## 2021-09-24 DIAGNOSIS — C787 Secondary malignant neoplasm of liver and intrahepatic bile duct: Secondary | ICD-10-CM | POA: Diagnosis present

## 2021-09-24 DIAGNOSIS — J189 Pneumonia, unspecified organism: Secondary | ICD-10-CM | POA: Diagnosis present

## 2021-09-24 DIAGNOSIS — I4891 Unspecified atrial fibrillation: Secondary | ICD-10-CM | POA: Diagnosis present

## 2021-09-24 DIAGNOSIS — J810 Acute pulmonary edema: Secondary | ICD-10-CM | POA: Diagnosis not present

## 2021-09-24 DIAGNOSIS — L89321 Pressure ulcer of left buttock, stage 1: Secondary | ICD-10-CM | POA: Diagnosis present

## 2021-09-24 DIAGNOSIS — R578 Other shock: Secondary | ICD-10-CM | POA: Diagnosis not present

## 2021-09-24 DIAGNOSIS — F4310 Post-traumatic stress disorder, unspecified: Secondary | ICD-10-CM | POA: Diagnosis present

## 2021-09-24 DIAGNOSIS — G9349 Other encephalopathy: Secondary | ICD-10-CM | POA: Diagnosis present

## 2021-09-24 DIAGNOSIS — L89312 Pressure ulcer of right buttock, stage 2: Secondary | ICD-10-CM | POA: Diagnosis present

## 2021-09-24 DIAGNOSIS — Z515 Encounter for palliative care: Secondary | ICD-10-CM | POA: Diagnosis not present

## 2021-09-24 DIAGNOSIS — E872 Acidosis, unspecified: Secondary | ICD-10-CM | POA: Diagnosis present

## 2021-09-24 DIAGNOSIS — I420 Dilated cardiomyopathy: Secondary | ICD-10-CM | POA: Diagnosis present

## 2021-09-24 DIAGNOSIS — C7931 Secondary malignant neoplasm of brain: Secondary | ICD-10-CM | POA: Diagnosis present

## 2021-09-24 LAB — URINALYSIS, ROUTINE W REFLEX MICROSCOPIC
Bilirubin Urine: NEGATIVE
Glucose, UA: NEGATIVE mg/dL
Ketones, ur: NEGATIVE mg/dL
Leukocytes,Ua: NEGATIVE
Nitrite: NEGATIVE
Protein, ur: NEGATIVE mg/dL
RBC / HPF: >50 RBC/hpf — ABNORMAL HIGH (ref 0–5)
Specific Gravity, Urine: 1.013 (ref 1.005–1.030)
pH: 7 (ref 5.0–8.0)

## 2021-09-24 LAB — ECHOCARDIOGRAM COMPLETE
AR max vel: 3.85 cm2
AV Area VTI: 3.57 cm2
AV Area mean vel: 3.86 cm2
AV Mean grad: 5 mmHg
AV Peak grad: 7.8 mmHg
Ao pk vel: 1.4 m/s
Area-P 1/2: 10.84 cm2
Calc EF: 54.6 %
Height: 74 in
S' Lateral: 2.8 cm
Single Plane A2C EF: 58 %
Single Plane A4C EF: 51.5 %
Weight: 2913.6 oz

## 2021-09-24 LAB — BASIC METABOLIC PANEL
Anion gap: 11 (ref 5–15)
BUN: 16 mg/dL (ref 8–23)
CO2: 24 mmol/L (ref 22–32)
Calcium: 8.2 mg/dL — ABNORMAL LOW (ref 8.9–10.3)
Chloride: 98 mmol/L (ref 98–111)
Creatinine, Ser: 0.38 mg/dL — ABNORMAL LOW (ref 0.61–1.24)
GFR, Estimated: >60 mL/min (ref >60)
Glucose, Bld: 133 mg/dL — ABNORMAL HIGH (ref 70–99)
Potassium: 3.2 mmol/L — ABNORMAL LOW (ref 3.5–5.1)
Sodium: 133 mmol/L — ABNORMAL LOW (ref 135–145)

## 2021-09-24 LAB — CBC
HCT: 41.4 % (ref 39.0–52.0)
Hemoglobin: 13.1 g/dL (ref 13.0–17.0)
MCH: 25.2 pg — ABNORMAL LOW (ref 26.0–34.0)
MCHC: 31.6 g/dL (ref 30.0–36.0)
MCV: 79.8 fL — ABNORMAL LOW (ref 80.0–100.0)
Platelets: 168 10*3/uL (ref 150–400)
RBC: 5.19 MIL/uL (ref 4.22–5.81)
RDW: 19.9 % — ABNORMAL HIGH (ref 11.5–15.5)
WBC: 8.9 10*3/uL (ref 4.0–10.5)
nRBC: 0 % (ref 0.0–0.2)

## 2021-09-24 LAB — RESP PANEL BY RT-PCR (FLU A&B, COVID) ARPGX2
Influenza A by PCR: NEGATIVE
Influenza B by PCR: NEGATIVE
SARS Coronavirus 2 by RT PCR: NEGATIVE

## 2021-09-24 LAB — TROPONIN I (HIGH SENSITIVITY): Troponin I (High Sensitivity): 34 ng/L — ABNORMAL HIGH (ref <18)

## 2021-09-24 LAB — MRSA NEXT GEN BY PCR, NASAL: MRSA by PCR Next Gen: NOT DETECTED

## 2021-09-24 MED ORDER — ACETAMINOPHEN 325 MG PO TABS: 650.0000 mg | ORAL_TABLET | Freq: Four times a day (QID) | ORAL | Status: DC | PRN

## 2021-09-24 MED ORDER — VITAMIN D 125 MCG (5000 UT) PO CAPS: 5000.0000 [IU] | ORAL_CAPSULE | Freq: Every day | ORAL | Status: DC

## 2021-09-24 MED ORDER — ONDANSETRON HCL 4 MG/2ML IJ SOLN: 4.0000 mg | Freq: Four times a day (QID) | INTRAMUSCULAR | Status: DC | PRN

## 2021-09-24 MED ORDER — SODIUM CHLORIDE 0.9 % IV SOLN
1.0000 g | INTRAVENOUS | Status: AC
  Administered 2021-09-25 – 2021-09-26 (×2): 1 g via INTRAVENOUS
  Filled 2021-09-24 (×2): qty 10

## 2021-09-24 MED ORDER — FUROSEMIDE 10 MG/ML IJ SOLN
40.0000 mg | Freq: Once | INTRAMUSCULAR | Status: AC
  Administered 2021-09-24: 40 mg via INTRAVENOUS
  Filled 2021-09-24: qty 4

## 2021-09-24 MED ORDER — POTASSIUM CHLORIDE CRYS ER 20 MEQ PO TBCR: 20.0000 meq | EXTENDED_RELEASE_TABLET | Freq: Every day | ORAL | Status: DC

## 2021-09-24 MED ORDER — SODIUM CHLORIDE 0.9 % IV SOLN
1.0000 g | Freq: Once | INTRAVENOUS | Status: AC
  Administered 2021-09-24: 1 g via INTRAVENOUS
  Filled 2021-09-24: qty 10

## 2021-09-24 MED ORDER — GUAIFENESIN 100 MG/5ML PO LIQD
5.0000 mL | ORAL | Status: DC | PRN
Start: 2021-09-24 — End: 2021-09-27

## 2021-09-24 MED ORDER — FUROSEMIDE 10 MG/ML IJ SOLN
40.0000 mg | Freq: Two times a day (BID) | INTRAMUSCULAR | Status: DC
  Administered 2021-09-24 – 2021-09-25 (×2): 40 mg via INTRAVENOUS
  Filled 2021-09-24 (×2): qty 4

## 2021-09-24 MED ORDER — NINTEDANIB ESYLATE 150 MG PO CAPS
150.0000 mg | ORAL_CAPSULE | Freq: Two times a day (BID) | ORAL | Status: DC
  Administered 2021-09-24 – 2021-09-25 (×3): 150 mg via ORAL

## 2021-09-24 MED ORDER — DILTIAZEM HCL 30 MG PO TABS
30.0000 mg | ORAL_TABLET | Freq: Four times a day (QID) | ORAL | Status: DC
  Administered 2021-09-24 – 2021-09-25 (×4): 30 mg via ORAL
  Filled 2021-09-24 (×4): qty 1

## 2021-09-24 MED ORDER — SODIUM CHLORIDE 0.9% FLUSH
3.0000 mL | Freq: Two times a day (BID) | INTRAVENOUS | Status: DC
  Administered 2021-09-24 – 2021-09-28 (×9): 3 mL via INTRAVENOUS

## 2021-09-24 MED ORDER — ACETAMINOPHEN 650 MG RE SUPP: 650.0000 mg | Freq: Four times a day (QID) | RECTAL | Status: DC | PRN

## 2021-09-24 MED ORDER — ORAL CARE MOUTH RINSE
15.0000 mL | Freq: Two times a day (BID) | OROMUCOSAL | Status: DC
  Administered 2021-09-24 – 2021-09-25 (×3): 15 mL via OROMUCOSAL
  Filled 2021-09-24: qty 15

## 2021-09-24 MED ORDER — IOHEXOL 350 MG/ML SOLN
75.0000 mL | Freq: Once | INTRAVENOUS | Status: AC | PRN
  Administered 2021-09-24: 75 mL via INTRAVENOUS

## 2021-09-24 MED ORDER — APIXABAN 5 MG PO TABS
5.0000 mg | ORAL_TABLET | Freq: Two times a day (BID) | ORAL | Status: DC
  Administered 2021-09-24 (×2): 5 mg via ORAL
  Filled 2021-09-24 (×2): qty 1

## 2021-09-24 MED ORDER — POTASSIUM CHLORIDE CRYS ER 20 MEQ PO TBCR
40.0000 meq | EXTENDED_RELEASE_TABLET | ORAL | Status: AC
  Administered 2021-09-24 (×2): 40 meq via ORAL
  Filled 2021-09-24 (×2): qty 2

## 2021-09-24 MED ORDER — CHLORHEXIDINE GLUCONATE CLOTH 2 % EX PADS
6.0000 | MEDICATED_PAD | Freq: Every day | CUTANEOUS | Status: DC
  Administered 2021-09-24 – 2021-09-28 (×4): 6 via TOPICAL
  Filled 2021-09-24: qty 6

## 2021-09-24 MED ORDER — ONDANSETRON HCL 4 MG PO TABS: 4.0000 mg | ORAL_TABLET | Freq: Four times a day (QID) | ORAL | Status: DC | PRN

## 2021-09-24 MED ORDER — TRAZODONE HCL 50 MG PO TABS
50.0000 mg | ORAL_TABLET | Freq: Every evening | ORAL | Status: DC | PRN
  Administered 2021-09-24: 50 mg via ORAL
  Filled 2021-09-24: qty 1

## 2021-09-24 MED ORDER — SENNOSIDES-DOCUSATE SODIUM 8.6-50 MG PO TABS: 1.0000 | ORAL_TABLET | Freq: Every evening | ORAL | Status: DC | PRN

## 2021-09-24 MED ORDER — VITAMIN B-12 1000 MCG PO TABS: 500.0000 ug | ORAL_TABLET | Freq: Every day | ORAL | Status: DC

## 2021-09-24 MED ORDER — SODIUM CHLORIDE 0.9 % IV SOLN
500.0000 mg | Freq: Once | INTRAVENOUS | Status: AC
  Administered 2021-09-24: 500 mg via INTRAVENOUS
  Filled 2021-09-24: qty 5

## 2021-09-24 NOTE — Progress Notes (Signed)
Per wife, last chemo was 06/22/21

## 2021-09-24 NOTE — Progress Notes (Signed)
  Echocardiogram 2D Echocardiogram has been performed.  Joette Catching 09/24/2021, 10:42 AM

## 2021-09-24 NOTE — Progress Notes (Signed)
PROGRESS NOTE    [name]  BEE:[id number] DOB: 1946/02/21 DOA: 09/23/2021 [facility]CP: [facility]   Brief Narrative:  76 year old with history of A-fib on Eliquis, interstitial lung disease, small cell lung cancer came to the ED with fatigue, shortness of breath, confusion and hypoxia.  Patient was recently seen at the [place] on 5/17 and was diagnosed with UTI and started on Macrobid.  Patient continues to have confusion, hypoxia and fatigue.  Upon admission CT showed concerns of right lower and middle lobe metastatic disease, pleural effusions and possible pneumonia.  He was also noted to be in atrial fibrillation with RVR requiring Cardizem drip.   Assessment & Plan:  Principal Problem:   Acute respiratory failure with hypoxia (HCC) Active Problems:   ILD (interstitial lung disease) ([place])   Small cell lung cancer (HCC)   Atrial fibrillation with RVR (HCC)   Hyponatremia   Acute on chronic heart failure with preserved ejection fraction (HFpEF) (HCC)   Acute UTI   Acute encephalopathy   Acute urinary retention   Malignant neoplasm of lung (HCC)   Pressure injury of skin    Acute hypoxic respiratory failure; acute on chronic HFpEF, class IIII Bilateral pleural effusion nitial signs of hypoxia.  This is combination of progression of metastatic disease, possible underlying pneumonia and pleural effusion.  Wean off oxygen as appropriate.  Echocardiogram in November 2022 showed preserved ejection fraction. Continue Lasix 40 mg IV twice daily Repeat echocardiogram May require thoracentesis if diuretics alone are not helping his symptoms.   Atrial fibrillation with RVR  - In rapid a fib on presentation; has hx of a fib on Eliquis but no rate-control agent  Continue Cardizem drip, p.o. Cardizem ordered.  Wean off   Acute encephalopathy, resolved Multifactorial.  Recently diagnosed with outpatient UTI on Macrobid but currently on IV Rocephin.  If necessary will obtain MRI  brain   UTI; POA -Was on outpatient Macrobid, currently on IV Rocephin.  Follow-up culture data.   Acute urinary retention  -Likely causing UTI.  Bladder scan when appropriate followed by straight in and out cath if necessary   Small cell lung cancer  - Managed by Dr. Mindi Junker at [facility] where he received carbo/etop, had significant side effects, and now followed with surveillance CT imaging  - CT in ED concerning for interval increase in size of RLL nodule and new RML and RLL nodules     ILD  - Continue Ofev    Hyponatremia/hypokalemia -Sodium levels improving with diuresis - Electrolyte repletion     Large coccygeal and buttock ulcer noted bilaterally.  Present prior to admission.  Wound care consulted.   DVT prophylaxis: Eliquis Code Status: Full Code Family Communication: Wife at bedside, also spoke with the daughter over the phone  Status is: Inpatient Remains inpatient appropriate because: Maintain hospital stay to wean him off Cardizem drip.  Requires IV Lasix.  May even need thoracentesis eventually.    Subjective:  Breathing is improved with diuresis.  Heart rate fluctuating from 90-115 while on Cardizem drip.    Examination:  General exam: Appears calm and comfortable  Respiratory system: Diminished breath sounds bilaterally at the bases Cardiovascular system: Irregularly irregular heart rate Gastrointestinal system: Abdomen is nondistended, soft and nontender. No organomegaly or masses felt. Normal bowel sounds heard. Central nervous system: Alert and oriented. No focal neurological deficits. Extremities: Symmetric 5 x 5 power. Skin: No rashes, lesions or ulcers Psychiatry: Judgement and insight appear normal. Mood & affect appropriate.  Objective: Vitals:   09/24/21 0210 09/24/21 0410 09/24/21 0500 09/24/21 0600  BP:  (!) 158/102 130/79 124/79  Pulse: 96 96 89 89  Resp: 19 (!) 25 20 16   Temp:  (!) 97.4 F (36.3 C)    TempSrc:  Oral     SpO2: 97% 96% 96% 97%  Weight:  82.6 kg    Height:  6\' 2"  (1.88 m)      Intake/Output Summary (Last 24 hours) at 09/24/2021 0744 Last data filed at 09/24/2021 0525 Gross per 24 hour  Intake 354.25 ml  Output 1450 ml  Net -1095.75 ml   Filed Weights   09/23/21 2237 09/24/21 0410  Weight: 77.1 kg 82.6 kg     Data Reviewed:   CBC: Recent Labs  Lab 09/23/21 2305 09/24/21 0524  WBC 10.2 8.9  HGB 14.2 13.1  HCT 43.9 41.4  MCV 79.7* 79.8*  PLT 184 347   Basic Metabolic Panel: Recent Labs  Lab 09/23/21 2305 09/24/21 0524  NA 129* 133*  K 3.8 3.2*  CL 96* 98  CO2 25 24  GLUCOSE 140* 133*  BUN 17 16  CREATININE 0.53* 0.38*  CALCIUM 8.2* 8.2*   GFR: Estimated Creatinine Clearance: 92.8 mL/min (A) (by C-G formula based on SCr of 0.38 mg/dL (L)). Liver Function Tests: No results for input(s): AST, ALT, ALKPHOS, BILITOT, PROT, ALBUMIN in the last 168 hours. No results for input(s): LIPASE, AMYLASE in the last 168 hours. No results for input(s): AMMONIA in the last 168 hours. Coagulation Profile: No results for input(s): INR, PROTIME in the last 168 hours. Cardiac Enzymes: No results for input(s): CKTOTAL, CKMB, CKMBINDEX, TROPONINI in the last 168 hours. BNP (last 3 results) No results for input(s): PROBNP in the last 8760 hours. HbA1C: No results for input(s): HGBA1C in the last 72 hours. CBG: No results for input(s): GLUCAP in the last 168 hours. Lipid Profile: No results for input(s): CHOL, HDL, LDLCALC, TRIG, CHOLHDL, LDLDIRECT in the last 72 hours. Thyroid Function Tests: No results for input(s): TSH, T4TOTAL, FREET4, T3FREE, THYROIDAB in the last 72 hours. Anemia Panel: No results for input(s): VITAMINB12, FOLATE, FERRITIN, TIBC, IRON, RETICCTPCT in the last 72 hours. Sepsis Labs: No results for input(s): PROCALCITON, LATICACIDVEN in the last 168 hours.  Recent Results (from the past 240 hour(s))  Resp Panel by RT-PCR (Flu A&B, Covid) Nasopharyngeal Swab      Status: None   Collection Time: 09/23/21 11:18 PM   Specimen: Nasopharyngeal Swab; Nasopharyngeal(NP) swabs in vial transport medium  Result Value Ref Range Status   SARS Coronavirus 2 by RT PCR NEGATIVE NEGATIVE Final    Comment: (NOTE) SARS-CoV-2 target nucleic acids are NOT DETECTED.  The SARS-CoV-2 RNA is generally detectable in upper respiratory specimens during the acute phase of infection. The lowest concentration of SARS-CoV-2 viral copies this assay can detect is 138 copies/mL. A negative result does not preclude SARS-Cov-2 infection and should not be used as the sole basis for treatment or other patient management decisions. A negative result may occur with  improper specimen collection/handling, submission of specimen other than nasopharyngeal swab, presence of viral mutation(s) within the areas targeted by this assay, and inadequate number of viral copies(<138 copies/mL). A negative result must be combined with clinical observations, patient history, and epidemiological information. The expected result is Negative.  Fact Sheet for Patients:  EntrepreneurPulse.com.au  Fact Sheet for Healthcare Providers:  IncredibleEmployment.be  This test is no t yet approved or cleared by the [place] and  has been authorized for detection and/or diagnosis of SARS-CoV-2 by FDA under an Emergency Use Authorization (EUA). This EUA will remain  in effect (meaning this test can be used) for the duration of the COVID-19 declaration under Section 564(b)(1) of the Act, 21 U.S.C.section 360bbb-3(b)(1), unless the authorization is terminated  or revoked sooner.       Influenza A by PCR NEGATIVE NEGATIVE Final   Influenza B by PCR NEGATIVE NEGATIVE Final    Comment: (NOTE) The Xpert Xpress SARS-CoV-2/FLU/RSV plus assay is intended as an aid in the diagnosis of influenza from Nasopharyngeal swab specimens and should not be used as a sole  basis for treatment. Nasal washings and aspirates are unacceptable for Xpert Xpress SARS-CoV-2/FLU/RSV testing.  Fact Sheet for Patients: EntrepreneurPulse.com.au  Fact Sheet for Healthcare Providers: IncredibleEmployment.be  This test is not yet approved or cleared by the [place] FDA and has been authorized for detection and/or diagnosis of SARS-CoV-2 by FDA under an Emergency Use Authorization (EUA). This EUA will remain in effect (meaning this test can be used) for the duration of the COVID-19 declaration under Section 564(b)(1) of the Act, 21 U.S.C. section 360bbb-3(b)(1), unless the authorization is terminated or revoked.  Performed at [facility], [address] [place], [place] [address]   MRSA Next Gen by PCR, Nasal     Status: None   Collection Time: 09/24/21  3:26 AM   Specimen: Nasal Mucosa; Nasal Swab  Result Value Ref Range Status   MRSA by PCR Next Gen NOT DETECTED NOT DETECTED Final    Comment: (NOTE) The GeneXpert MRSA Assay (FDA approved for NASAL specimens only), is one component of a comprehensive MRSA colonization surveillance program. It is not intended to diagnose MRSA infection nor to guide or monitor treatment for MRSA infections. Test performance is not FDA approved in patients less than 53 years old. Performed at [facility], [place] [address], [place], [place] [address]          Radiology Studies: DG Chest 2 View  Result Date: 09/23/2021 CLINICAL DATA:  Shortness of breath. Low oxygen saturation. History of lung cancer and pulmonary fibrosis. EXAM: CHEST - 2 VIEW COMPARISON:  Radiograph 08/18/2020, CT 07/14/2020 FINDINGS: Chronic cardiomegaly. Fiducial markers at the right lung base. There are fine interstitial opacities are increased from prior exam, particularly in the right upper lobe. Peripheral subpleural opacity consistent with interstitial lung disease. Possible small  right pleural effusion. No pneumothorax. IMPRESSION: Increased fine interstitial opacities, particularly in the right upper lobe, may be pulmonary edema, and progression in pulmonary fibrosis or atypical infection. Possible small right pleural effusion. Electronically Signed   By: Keith Rake M.D.   On: 09/23/2021 23:03   CT Angio Chest PE W and/or Wo Contrast  Result Date: 09/24/2021 CLINICAL DATA:  Lung/mediastinal abscess Shortness of breath.  History of lung cancer and pulmonary fibrosis. EXAM: CT ANGIOGRAPHY CHEST WITH CONTRAST TECHNIQUE: Multidetector CT imaging of the chest was performed using the standard protocol during bolus administration of intravenous contrast. Multiplanar CT image reconstructions and MIPs were obtained to evaluate the vascular anatomy. RADIATION DOSE REDUCTION: This exam was performed according to the departmental dose-optimization program which includes automated exposure control, adjustment of the mA and/or kV according to patient size and/or use of iterative reconstruction technique. CONTRAST:  21mL OMNIPAQUE IOHEXOL 350 MG/ML SOLN COMPARISON:  Radiograph yesterday. Chest CT 06/20/2021 at [facility], images available but no report. FINDINGS: Cardiovascular: There are no filling defects within  the pulmonary arteries to suggest pulmonary embolus. No aortic dissection or acute aortic findings. There is mild cardiomegaly. Mild contrast refluxing into the IVC. Trace pericardial effusion. Mediastinum/Nodes: Nonspecific right hilar adenopathy including a 19 mm infrahilar node series 5, image 61. There additional prominent and mildly enlarged right hilar lymph nodes. Scattered small mediastinal lymph nodes are not enlarged by size criteria. No esophageal wall thickening. Lungs/Pleura: Moderate right and small left pleural effusion, new from prior. There is peripheral subpleural reticulation consistent with underlying interstitial lung disease, chronic. New from prior exam  diffuse ground-glass opacity throughout the right upper and lower lobes. There is also mild ground-glass opacity in the perifissural and anterior left upper lobe. There are multiple small nodules in the right lower lobe which appear to be new from prior, for example series 6, image 69 in 63. Peripheral right lower lobe nodule with fiducial markers has increased in size currently 3.2 x 3.2 cm, previously 2.6 x 1.4 cm. Right middle lobe 8 mm nodule series 6, image 66, new. There is retained mucus within the right aspect of the trachea. Upper Abdomen: Contrast refluxing into the IVC. Nonobstructing left renal calculi. Musculoskeletal: There is marked body wall edema. 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 These are grossly stable. No vertebral body compression deformity or evidence of pathologic fracture. Review of the MIP images confirms the above findings. IMPRESSION: 1. No pulmonary embolus. 2. Moderate right and small left pleural effusions, new from prior exam. Ground-glass opacities throughout the right upper and lower lobes and to a lesser extent left upper lobe, favored to represent pulmonary edema in this setting. Possibility of atypical infection is also considered, lymphangitic spread of tumor is felt less likely. There is generalized body wall edema. 3. Right lower lobe nodule with fiducial markers has increased in size from prior exam. There are additional new pulmonary nodules in the right lower and right middle lobes, concerning for metastatic disease. Recommend continued oncologic follow-up. 4. Nonspecific right hilar adenopathy. 5. 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 Electronically Signed   By: Keith Rake M.D.   On: 09/24/2021 00:53        Scheduled Meds:  Chlorhexidine Gluconate Cloth  6 each Topical Q0600   diltiazem  10 mg Intravenous Once    furosemide  40 mg Intravenous Q12H   mouth rinse  15 mL Mouth Rinse BID   potassium chloride  20 mEq Oral Daily   sodium chloride flush  3 mL Intravenous Q12H   Continuous Infusions:  [START ON 09/25/2021] cefTRIAXone (ROCEPHIN)  IV     diltiazem (CARDIZEM) infusion 5 mg/hr (09/24/21 0525)     LOS: 0 days   Time spent= 35 mins    Kilah Drahos Arsenio Loader, MD [facility]  If 7PM-7AM, please contact night-coverage  09/24/2021, 7:44 AM

## 2021-09-24 NOTE — Progress Notes (Signed)
On admission, pt noted with a large St I to coccyx and bilateral buttocks.  Within that area, there are scattered St II to [facility] III wounds, some appear to possibly be tunneling and connected to each other.  Pt also noted with DTI to bilateral buttocks and coccyx.  Rt buttocks presents with ST II, appears to be enhanced by shearing and MASD.   Pt also has MASD, reddened area to groin, worse on left side.  Unable to measure and count individual wounds on admission due to pt anxiety during admission;  MD notified and wound care consult placed.

## 2021-09-24 NOTE — H&P (Addendum)
History and Physical    [name] SWF:[id number] DOB: July 19, 1945 DOA: 09/23/2021  PCP: [facility]   Patient coming from: Home   Chief Complaint: Fatigue, confusion, SOB, low oxygen saturation   HPI: [name] is a pleasant 76 y.o. male with medical history significant for atrial fibrillation on Eliquis, interstitial lung disease, and small cell lung cancer, now presenting to the emergency department for evaluation of fatigue, confusion, shortness of breath, and low oxygen saturations.  Patient has had waxing and waning confusion for several days, was seen at the [place] for this on 09/20/2021, was diagnosed with UTI, and started nitrofurantoin the following day.  He has continued to have some confusion, worse at night, and has also had increased fatigue and shortness of breath.  A relative who is an Therapist, sports checked on him yesterday and found his oxygen saturations to be in the 80s, and advised that he seek evaluation in the emergency department.  [facility] ED Course: Upon arrival to the ED, patient is found to be afebrile, saturating upper 80s on room air, tachycardic in the 110s, and with stable blood pressure.  EKG features atrial fibrillation with rate 111 and LAD.  CTA chest negative for PE but notable for new pleural effusions and likely edema, as well as interval increase in size of right lower lobe nodule.  Also noted on CT is new right lower lobe and right middle lobe nodules concerning for metastatic disease.  Patient was given 40 mg IV Lasix, 10 mg IV diltiazem, Rocephin, azithromycin, and started on diltiazem infusion.  He was transferred to [facility] for admission.  Review of Systems:  All other systems reviewed and apart from HPI, are negative.  Past Medical History:  Diagnosis Date   Arthritis    Cancer [facility])    Coronary artery calcification seen on CAT scan    Dysrhythmia    History of kidney stones    PAF (paroxysmal atrial fibrillation) ([place]) 08/02/2014    PTSD (post-traumatic stress disorder)    controlled    Past Surgical History:  Procedure Laterality Date   BACK SURGERY     2000   BRONCHIAL BIOPSY  08/08/2020   Procedure: BRONCHIAL BIOPSIES;  Surgeon: Collene Gobble, MD;  Location: [place];  Service: Cardiopulmonary;;   BRONCHIAL BRUSHINGS  08/08/2020   Procedure: BRONCHIAL BRUSHINGS;  Surgeon: Collene Gobble, MD;  Location: [place];  Service: Cardiopulmonary;;   BRONCHIAL NEEDLE ASPIRATION BIOPSY  08/08/2020   Procedure: BRONCHIAL NEEDLE ASPIRATION BIOPSIES;  Surgeon: Collene Gobble, MD;  Location: [place] [facility];  Service: Cardiopulmonary;;   CARPAL TUNNEL RELEASE Right    FIDUCIAL MARKER PLACEMENT  08/08/2020   Procedure: FIDUCIAL MARKER PLACEMENT;  Surgeon: Collene Gobble, MD;  Location: [place] [facility];  Service: Cardiopulmonary;;   JOINT REPLACEMENT      left 2005   SHOULDER ARTHROSCOPY W/ ROTATOR CUFF REPAIR  2012   right   TOTAL KNEE ARTHROPLASTY  08/28/2011   Procedure: TOTAL KNEE ARTHROPLASTY;  Surgeon: Garald Balding, MD;  Location: [place];  Service: Orthopedics;  Laterality: Right;   VIDEO BRONCHOSCOPY WITH ENDOBRONCHIAL NAVIGATION  08/08/2020   Procedure: VIDEO BRONCHOSCOPY WITH ENDOBRONCHIAL NAVIGATION;  Surgeon: Collene Gobble, MD;  Location: [facility];  Service: Cardiopulmonary;;    Social History:   reports that he quit smoking about 10 months ago. His smoking use included cigarettes. He started smoking about 41 years ago. He has a 20.00 pack-year smoking history. He has never used  smokeless tobacco. He reports that he does not drink alcohol and does not use drugs.  No Known Allergies  Family History  Problem Relation Age of Onset   Lung cancer Mother    Kidney cancer Father    Heart attack Maternal Grandfather    Alcoholism Brother    HIV Brother    Anesthesia problems Neg Hx    Hypotension Neg Hx    Malignant hyperthermia Neg Hx    Pseudochol deficiency Neg Hx      Prior to Admission  medications   Medication Sig Start Date End Date Taking? Authorizing Provider  apixaban (ELIQUIS) 5 MG TABS tablet Take 5 mg by mouth 2 (two) times daily.   Yes [provider]  Cholecalciferol (VITAMIN D) 125 MCG (5000 UT) CAPS Take 5,000 Units by mouth daily.    [provider]  cyanocobalamin 500 MCG tablet Take 500 mcg by mouth daily.    [provider]  Elderberry 575 MG/5ML SYRP Take 5 mLs by mouth daily.    [provider]  Menaquinone-7 (VITAMIN K2) 100 MCG CAPS Take 100 mg by mouth daily.    [provider]  Nintedanib (OFEV) 150 MG CAPS Take 1 capsule (150 mg total) by mouth 2 (two) times daily. 05/26/21   Marshell Garfinkel, MD  OVER THE COUNTER MEDICATION Apply 1 application topically daily. CBD Cream on hand    [provider]  Probiotic Product (PROBIOTIC DAILY) CAPS Take 842 mg by mouth daily.    [provider]  vitamin C (ASCORBIC ACID) 500 MG tablet Take 500 mg by mouth daily.    [provider]  Zinc 50 MG TABS Take 50 mg by mouth daily.    [provider]    Physical Exam: Vitals:   09/24/21 0130 09/24/21 0145 09/24/21 0210 09/24/21 0410  BP: (!) 135/92 139/90  (!) 158/102  Pulse: (!) 111 90 96 96  Resp: 19 (!) 22 19 (!) 25  Temp:    (!) 97.4 F (36.3 C)  TempSrc:    Oral  SpO2: 99% 97% 97% 96%  Weight:    82.6 kg  Height:    6\' 2"  (1.88 m)    Constitutional: NAD, restless  Eyes: PERTLA, lids and conjunctivae normal ENMT: Mucous membranes are moist. Posterior pharynx clear of any exudate or lesions.   Neck: supple, no masses  Respiratory: Fine rales bilaterally. No accessory muscle use.  Cardiovascular: Rate ~110 and irregularly irregular. Lower leg swelling bilaterally.  Abdomen: No distension, soft. Bowel sounds active.  Musculoskeletal: no clubbing / cyanosis. No joint deformity upper and lower extremities.   Skin: no significant rashes, lesions, ulcers. Warm, dry,  well-perfused. Neurologic: CN 2-12 grossly intact. Moving all exremities. Alert and oriented to person and place.   Psychiatric: Pleasant. Cooperative. Restless and distracted, pulling at wires and removing gown.   Labs and Imaging on Admission: I have personally reviewed following labs and imaging studies  CBC: Recent Labs  Lab 09/23/21 2305  WBC 10.2  HGB 14.2  HCT 43.9  MCV 79.7*  PLT 992   Basic Metabolic Panel: Recent Labs  Lab 09/23/21 2305  NA 129*  K 3.8  CL 96*  CO2 25  GLUCOSE 140*  BUN 17  CREATININE 0.53*  CALCIUM 8.2*   GFR: Estimated Creatinine Clearance: 92.8 mL/min (A) (by C-G formula based on SCr of 0.53 mg/dL (L)). Liver Function Tests: No results for input(s): AST, ALT, ALKPHOS, BILITOT, PROT, ALBUMIN in the last 168  hours. No results for input(s): LIPASE, AMYLASE in the last 168 hours. No results for input(s): AMMONIA in the last 168 hours. Coagulation Profile: No results for input(s): INR, PROTIME in the last 168 hours. Cardiac Enzymes: No results for input(s): CKTOTAL, CKMB, CKMBINDEX, TROPONINI in the last 168 hours. BNP (last 3 results) No results for input(s): PROBNP in the last 8760 hours. HbA1C: No results for input(s): HGBA1C in the last 72 hours. CBG: No results for input(s): GLUCAP in the last 168 hours. Lipid Profile: No results for input(s): CHOL, HDL, LDLCALC, TRIG, CHOLHDL, LDLDIRECT in the last 72 hours. Thyroid Function Tests: No results for input(s): TSH, T4TOTAL, FREET4, T3FREE, THYROIDAB in the last 72 hours. Anemia Panel: No results for input(s): VITAMINB12, FOLATE, FERRITIN, TIBC, IRON, RETICCTPCT in the last 72 hours. Urine analysis:    Component Value Date/Time   COLORURINE YELLOW 08/20/2011 [place] 08/20/2011 1355   LABSPEC 1.004 (L) 08/20/2011 1355   PHURINE 7.0 08/20/2011 1355   GLUCOSEU NEGATIVE 08/20/2011 1355   HGBUR NEGATIVE 08/20/2011 1355   BILIRUBINUR NEGATIVE 08/20/2011 1355    KETONESUR NEGATIVE 08/20/2011 1355   PROTEINUR NEGATIVE 08/20/2011 1355   UROBILINOGEN 1.0 08/20/2011 1355   NITRITE NEGATIVE 08/20/2011 1355   LEUKOCYTESUR NEGATIVE 08/20/2011 1355   Sepsis Labs: @LABRCNTIP (procalcitonin:4,lacticidven:4) ) Recent Results (from the past 240 hour(s))  Resp Panel by RT-PCR (Flu A&B, Covid) Nasopharyngeal Swab     Status: None   Collection Time: 09/23/21 11:18 PM   Specimen: Nasopharyngeal Swab; Nasopharyngeal(NP) swabs in vial transport medium  Result Value Ref Range Status   SARS Coronavirus 2 by RT PCR NEGATIVE NEGATIVE Final    Comment: (NOTE) SARS-CoV-2 target nucleic acids are NOT DETECTED.  The SARS-CoV-2 RNA is generally detectable in upper respiratory specimens during the acute phase of infection. The lowest concentration of SARS-CoV-2 viral copies this assay can detect is 138 copies/mL. A negative result does not preclude SARS-Cov-2 infection and should not be used as the sole basis for treatment or other patient management decisions. A negative result may occur with  improper specimen collection/handling, submission of specimen other than nasopharyngeal swab, presence of viral mutation(s) within the areas targeted by this assay, and inadequate number of viral copies(<138 copies/mL). A negative result must be combined with clinical observations, patient history, and epidemiological information. The expected result is Negative.  Fact Sheet for Patients:  EntrepreneurPulse.com.au  Fact Sheet for Healthcare Providers:  IncredibleEmployment.be  This test is no t yet approved or cleared by the [place] FDA and  has been authorized for detection and/or diagnosis of SARS-CoV-2 by FDA under an Emergency Use Authorization (EUA). This EUA will remain  in effect (meaning this test can be used) for the duration of the COVID-19 declaration under Section 564(b)(1) of the Act, 21 U.S.C.section 360bbb-3(b)(1),  unless the authorization is terminated  or revoked sooner.       Influenza A by PCR NEGATIVE NEGATIVE Final   Influenza B by PCR NEGATIVE NEGATIVE Final    Comment: (NOTE) The Xpert Xpress SARS-CoV-2/FLU/RSV plus assay is intended as an aid in the diagnosis of influenza from Nasopharyngeal swab specimens and should not be used as a sole basis for treatment. Nasal washings and aspirates are unacceptable for Xpert Xpress SARS-CoV-2/FLU/RSV testing.  Fact Sheet for Patients: EntrepreneurPulse.com.au  Fact Sheet for Healthcare Providers: IncredibleEmployment.be  This test is not yet approved or cleared by the [place] FDA and has been authorized for detection and/or diagnosis of SARS-CoV-2 by FDA  under an Emergency Use Authorization (EUA). This EUA will remain in effect (meaning this test can be used) for the duration of the COVID-19 declaration under Section 564(b)(1) of the Act, 21 U.S.C. section 360bbb-3(b)(1), unless the authorization is terminated or revoked.  Performed at [facility], [address] [place], [place] [address]      Radiological Exams on Admission: DG Chest 2 View  Result Date: 09/23/2021 CLINICAL DATA:  Shortness of breath. Low oxygen saturation. History of lung cancer and pulmonary fibrosis. EXAM: CHEST - 2 VIEW COMPARISON:  Radiograph 08/18/2020, CT 07/14/2020 FINDINGS: Chronic cardiomegaly. Fiducial markers at the right lung base. There are fine interstitial opacities are increased from prior exam, particularly in the right upper lobe. Peripheral subpleural opacity consistent with interstitial lung disease. Possible small right pleural effusion. No pneumothorax. IMPRESSION: Increased fine interstitial opacities, particularly in the right upper lobe, may be pulmonary edema, and progression in pulmonary fibrosis or atypical infection. Possible small right pleural effusion. Electronically Signed   By: Keith Rake M.D.   On: 09/23/2021 23:03   CT Angio Chest PE W and/or Wo Contrast  Result Date: 09/24/2021 CLINICAL DATA:  Lung/mediastinal abscess Shortness of breath.  History of lung cancer and pulmonary fibrosis. EXAM: CT ANGIOGRAPHY CHEST WITH CONTRAST TECHNIQUE: Multidetector CT imaging of the chest was performed using the standard protocol during bolus administration of intravenous contrast. Multiplanar CT image reconstructions and MIPs were obtained to evaluate the vascular anatomy. RADIATION DOSE REDUCTION: This exam was performed according to the departmental dose-optimization program which includes automated exposure control, adjustment of the mA and/or kV according to patient size and/or use of iterative reconstruction technique. CONTRAST:  42mL OMNIPAQUE IOHEXOL 350 MG/ML SOLN COMPARISON:  Radiograph yesterday. Chest CT 06/20/2021 at [facility], images available but no report. FINDINGS: Cardiovascular: There are no filling defects within the pulmonary arteries to suggest pulmonary embolus. No aortic dissection or acute aortic findings. There is mild cardiomegaly. Mild contrast refluxing into the IVC. Trace pericardial effusion. Mediastinum/Nodes: Nonspecific right hilar adenopathy including a 19 mm infrahilar node series 5, image 61. There additional prominent and mildly enlarged right hilar lymph nodes. Scattered small mediastinal lymph nodes are not enlarged by size criteria. No esophageal wall thickening. Lungs/Pleura: Moderate right and small left pleural effusion, new from prior. There is peripheral subpleural reticulation consistent with underlying interstitial lung disease, chronic. New from prior exam diffuse ground-glass opacity throughout the right upper and lower lobes. There is also mild ground-glass opacity in the perifissural and anterior left upper lobe. There are multiple small nodules in the right lower lobe which appear to be new from prior, for example series 6, image 69 in  63. Peripheral right lower lobe nodule with fiducial markers has increased in size currently 3.2 x 3.2 cm, previously 2.6 x 1.4 cm. Right middle lobe 8 mm nodule series 6, image 66, new. There is retained mucus within the right aspect of the trachea. Upper Abdomen: Contrast refluxing into the IVC. Nonobstructing left renal calculi. Musculoskeletal: There is marked body wall edema. 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 These are grossly stable. No vertebral body compression deformity or evidence of pathologic fracture. Review of the MIP images confirms the above findings. IMPRESSION: 1. No pulmonary embolus. 2. Moderate right and small left pleural effusions, new from prior exam. Ground-glass opacities throughout the right upper and lower lobes and to a lesser extent left upper lobe, favored to represent pulmonary edema in this  setting. Possibility of atypical infection is also considered, lymphangitic spread of tumor is felt less likely. There is generalized body wall edema. 3. Right lower lobe nodule with fiducial markers has increased in size from prior exam. There are additional new pulmonary nodules in the right lower and right middle lobes, concerning for metastatic disease. Recommend continued oncologic follow-up. 4. Nonspecific right hilar adenopathy. 5. 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 Electronically Signed   By: Keith Rake M.D.   On: 09/24/2021 00:53    EKG: Independently reviewed. Atrial fibrillation, rate 111, LAD.   Assessment/Plan   1. Acute hypoxic respiratory failure; acute on chronic HFpEF  - Presents with fatigue, confusion, SOB, and oxygen saturation in 80s at home  - Found to have new 2 Lpm supplemental O2 requirement and pulmonary edema and pleural effusions on CT  - EF was preserved on TTE from November 2022  - Treated with 40 mg IV Lasix in  ED  - Continue diuresis with IV Lasix, update echo    2. Atrial fibrillation with RVR  - In rapid a fib on presentation; has hx of a fib on Eliquis but no rate-control agent  - Started on IV diltiazem infusion in ED  - Rate may slow with diuresis and improvement in respiratory status, continue IV diltiazem for now   3. Acute encephalopathy  - Several days of waxing and waning confusion  - Most likely from his UTI but considering his SCLC, would consider MRI brain if fails to improve with antibiotic  - Delirium precautions   4. UTI  - Seen at [facility] on 5/17 with fatigue, delirium, and urinary symptoms and started nitrofurantoin 5/18   - Received Rocephin in ED  - Culture urine, continue Rocephin for now   5. Acute urinary retention  - 700 mL post-void residual, likely from acute UTI  - I&O cath as needed for now    6. Small cell lung cancer  - Managed by Dr. Mindi Junker at [facility] where he received carbo/etop, had significant side effects, and now followed with surveillance CT imaging  - CT in ED concerning for interval increase in size of RLL nodule and new RML and RLL nodules    7. ILD  - Continue Ofev   8. Hyponatremia  - Serum sodium 129 on admission in setting of hypervolemia and small cell lung cancer  - Monitor closely while diuresing, restrict free-water    DVT prophylaxis: Eliquis  Code Status: Full, discussed with patient's wife on admission  Level of Care: Level of care: Stepdown Family Communication: wife and sister at bedside  Disposition Plan:  Patient is from: home  Anticipated d/c is to: TBD Anticipated d/c date is: 09/27/21  Patient currently: pending improved respiratory status with diuresis, improved mental status, rate-control, transition to oral medications  Consults called: None  Admission status: Inpatient     Vianne Bulls, MD [facility]  09/24/2021, 4:46 AM

## 2021-09-24 NOTE — ED Provider Notes (Signed)
[facility] EMERGENCY DEPARTMENT Provider Note   CSN: [id number] Arrival date & time: 09/23/21  2233     History  Chief Complaint  Patient presents with   Shortness of [place] is a 76 y.o. male.  The history is provided by the patient and the spouse.  Shortness of Breath Severity:  Severe Onset quality:  Sudden Duration: hours with O2 saturations in the 80s. Timing:  Constant Progression:  Unchanged Chronicity:  New Context: not URI   Relieved by:  Nothing Worsened by:  Nothing Ineffective treatments:  None tried Associated symptoms: no fever and no wheezing   Risk factors: hx of cancer   Patient with lung CA and pulmonary fibrosis presents with AFIB with RVR and hypoxia into the 80s at home.      Home Medications Prior to Admission medications   Medication Sig Start Date End Date Taking? Authorizing Provider  Cholecalciferol (VITAMIN D) 125 MCG (5000 UT) CAPS Take 5,000 Units by mouth daily.    [provider]  cyanocobalamin 500 MCG tablet Take 500 mcg by mouth daily.    [provider]  Elderberry 575 MG/5ML SYRP Take 5 mLs by mouth daily.    [provider]  Menaquinone-7 (VITAMIN K2) 100 MCG CAPS Take 100 mg by mouth daily.    [provider]  Nintedanib (OFEV) 150 MG CAPS Take 1 capsule (150 mg total) by mouth 2 (two) times daily. 05/26/21   Marshell Garfinkel, MD  OVER THE COUNTER MEDICATION Apply 1 application topically daily. CBD Cream on hand    [provider]  Probiotic Product (PROBIOTIC DAILY) CAPS Take 842 mg by mouth daily.    [provider]  vitamin C (ASCORBIC ACID) 500 MG tablet Take 500 mg by mouth daily.    [provider]  Zinc 50 MG TABS Take 50 mg by mouth daily.    [provider]      Allergies    Patient has no known allergies.    Review of Systems   Review of Systems  Constitutional:  Negative for fever.  HENT:  Negative for facial  swelling.   Eyes:  Negative for redness.  Respiratory:  Positive for shortness of breath. Negative for wheezing.   Cardiovascular:  Positive for palpitations.  Neurological:  Negative for facial asymmetry.  All other systems reviewed and are negative.  Physical Exam Updated Vital Signs BP (!) 133/98   Pulse 90   Temp 97.6 F (36.4 C) (Oral)   Resp (!) 21   Ht 6\' 2"  (1.88 m)   Wt 77.1 kg   SpO2 98%   BMI 21.83 kg/m  Physical Exam Vitals and nursing note reviewed. Exam conducted with a chaperone present.  Constitutional:      Appearance: Normal appearance. He is not diaphoretic.  HENT:     Head: Normocephalic and atraumatic.     Nose: Nose normal.  Eyes:     Pupils: Pupils are equal, round, and reactive to light.  Cardiovascular:     Rate and Rhythm: Tachycardia present. Rhythm irregular.     Pulses: Normal pulses.     Heart sounds: Normal heart sounds.  Pulmonary:     Breath sounds: Decreased breath sounds present. No rhonchi.  Abdominal:     General: Bowel sounds are normal.     Palpations: Abdomen is soft.     Tenderness: There is no abdominal tenderness. There is no guarding.  Musculoskeletal:  General: Normal range of motion.  Skin:    General: Skin is warm and dry.     Capillary Refill: Capillary refill takes less than 2 seconds.  Neurological:     General: No focal deficit present.     Mental Status: He is alert and oriented to person, place, and time.     Deep Tendon Reflexes: Reflexes normal.  Psychiatric:        Mood and Affect: Mood normal.        Behavior: Behavior normal.    ED Results / Procedures / Treatments   Labs (all labs ordered are listed, but only abnormal results are displayed) Results for orders placed or performed during the hospital encounter of 09/23/21  Resp Panel by RT-PCR (Flu A&B, Covid) Nasopharyngeal Swab   Specimen: Nasopharyngeal Swab; Nasopharyngeal(NP) swabs in vial transport medium  Result Value Ref Range   SARS  Coronavirus 2 by RT PCR NEGATIVE NEGATIVE   Influenza A by PCR NEGATIVE NEGATIVE   Influenza B by PCR NEGATIVE NEGATIVE  Basic metabolic panel  Result Value Ref Range   Sodium 129 (L) 135 - 145 mmol/L   Potassium 3.8 3.5 - 5.1 mmol/L   Chloride 96 (L) 98 - 111 mmol/L   CO2 25 22 - 32 mmol/L   Glucose, Bld 140 (H) 70 - 99 mg/dL   BUN 17 8 - 23 mg/dL   Creatinine, Ser 0.53 (L) 0.61 - 1.24 mg/dL   Calcium 8.2 (L) 8.9 - 10.3 mg/dL   GFR, Estimated >60 >60 mL/min   Anion gap 8 5 - 15  CBC  Result Value Ref Range   WBC 10.2 4.0 - 10.5 K/uL   RBC 5.51 4.22 - 5.81 MIL/uL   Hemoglobin 14.2 13.0 - 17.0 g/dL   HCT 43.9 39.0 - 52.0 %   MCV 79.7 (L) 80.0 - 100.0 fL   MCH 25.8 (L) 26.0 - 34.0 pg   MCHC 32.3 30.0 - 36.0 g/dL   RDW 20.2 (H) 11.5 - 15.5 %   Platelets 184 150 - 400 K/uL   nRBC 0.2 0.0 - 0.2 %  Brain natriuretic peptide  Result Value Ref Range   B Natriuretic Peptide 287.6 (H) 0.0 - 100.0 pg/mL  Troponin I (High Sensitivity)  Result Value Ref Range   Troponin I (High Sensitivity) 39 (H) <18 ng/L   DG Chest 2 View  Result Date: 09/23/2021 CLINICAL DATA:  Shortness of breath. Low oxygen saturation. History of lung cancer and pulmonary fibrosis. EXAM: CHEST - 2 VIEW COMPARISON:  Radiograph 08/18/2020, CT 07/14/2020 FINDINGS: Chronic cardiomegaly. Fiducial markers at the right lung base. There are fine interstitial opacities are increased from prior exam, particularly in the right upper lobe. Peripheral subpleural opacity consistent with interstitial lung disease. Possible small right pleural effusion. No pneumothorax. IMPRESSION: Increased fine interstitial opacities, particularly in the right upper lobe, may be pulmonary edema, and progression in pulmonary fibrosis or atypical infection. Possible small right pleural effusion. Electronically Signed   By: Keith Rake M.D.   On: 09/23/2021 23:03   CT Angio Chest PE W and/or Wo Contrast  Result Date: 09/24/2021 CLINICAL DATA:   Lung/mediastinal abscess Shortness of breath.  History of lung cancer and pulmonary fibrosis. EXAM: CT ANGIOGRAPHY CHEST WITH CONTRAST TECHNIQUE: Multidetector CT imaging of the chest was performed using the standard protocol during bolus administration of intravenous contrast. Multiplanar CT image reconstructions and MIPs were obtained to evaluate the vascular anatomy. RADIATION DOSE REDUCTION: This exam was performed according to  the departmental dose-optimization program which includes automated exposure control, adjustment of the mA and/or kV according to patient size and/or use of iterative reconstruction technique. CONTRAST:  66mL OMNIPAQUE IOHEXOL 350 MG/ML SOLN COMPARISON:  Radiograph yesterday. Chest CT [facility]/20/2021 at [facility], images available but no report. FINDINGS: Cardiovascular: There are no filling defects within the pulmonary arteries to suggest pulmonary embolus. No aortic dissection or acute aortic findings. There is mild cardiomegaly. Mild contrast refluxing into the IVC. Trace pericardial effusion. Mediastinum/Nodes: Nonspecific right hilar adenopathy including a 19 mm infrahilar node series 5, image 61. There additional prominent and mildly enlarged right hilar lymph nodes. Scattered small mediastinal lymph nodes are not enlarged by size criteria. No esophageal wall thickening. Lungs/Pleura: Moderate right and small left pleural effusion, new from prior. There is peripheral subpleural reticulation consistent with underlying interstitial lung disease, chronic. New from prior exam diffuse ground-glass opacity throughout the right upper and lower lobes. There is also mild ground-glass opacity in the perifissural and anterior left upper lobe. There are multiple small nodules in the right lower lobe which appear to be new from prior, for example series 6, image 69 in 63. Peripheral right lower lobe nodule with fiducial markers has increased in size currently 3.2 x 3.2 cm, previously 2.6 x  1.4 cm. Right middle lobe 8 mm nodule series 6, image 66, new. There is retained mucus within the right aspect of the trachea. Upper Abdomen: Contrast refluxing into the IVC. Nonobstructing left renal calculi. Musculoskeletal: There is marked body wall edema. 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 These are grossly stable. No vertebral body compression deformity or evidence of pathologic fracture. Review of the MIP images confirms the above findings. IMPRESSION: 1. No pulmonary embolus. 2. Moderate right and small left pleural effusions, new from prior exam. Ground-glass opacities throughout the right upper and lower lobes and to a lesser extent left upper lobe, favored to represent pulmonary edema in this setting. Possibility of atypical infection is also considered, lymphangitic spread of tumor is felt less likely. There is generalized body wall edema. 3. Right lower lobe nodule with fiducial markers has increased in size from prior exam. There are additional new pulmonary nodules in the right lower and right middle lobes, concerning for metastatic disease. Recommend continued oncologic follow-up. 4. Nonspecific right hilar adenopathy. 5. 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 Electronically Signed   By: Keith Rake M.D.   On: 09/24/2021 00:53       Radiology DG Chest 2 View  Result Date: 09/23/2021 CLINICAL DATA:  Shortness of breath. Low oxygen saturation. History of lung cancer and pulmonary fibrosis. EXAM: CHEST - 2 VIEW COMPARISON:  Radiograph 08/18/2020, CT 07/14/2020 FINDINGS: Chronic cardiomegaly. Fiducial markers at the right lung base. There are fine interstitial opacities are increased from prior exam, particularly in the right upper lobe. Peripheral subpleural opacity consistent with interstitial lung disease. Possible small right pleural effusion. No  pneumothorax. IMPRESSION: Increased fine interstitial opacities, particularly in the right upper lobe, may be pulmonary edema, and progression in pulmonary fibrosis or atypical infection. Possible small right pleural effusion. Electronically Signed   By: Keith Rake M.D.   On: 09/23/2021 23:03   CT Angio Chest PE W and/or Wo Contrast  Result Date: 09/24/2021 CLINICAL DATA:  Lung/mediastinal abscess Shortness of breath.  History of lung cancer and pulmonary fibrosis. EXAM: CT ANGIOGRAPHY CHEST WITH CONTRAST TECHNIQUE: Multidetector CT imaging of the chest was  performed using the standard protocol during bolus administration of intravenous contrast. Multiplanar CT image reconstructions and MIPs were obtained to evaluate the vascular anatomy. RADIATION DOSE REDUCTION: This exam was performed according to the departmental dose-optimization program which includes automated exposure control, adjustment of the mA and/or kV according to patient size and/or use of iterative reconstruction technique. CONTRAST:  66mL OMNIPAQUE IOHEXOL 350 MG/ML SOLN COMPARISON:  Radiograph yesterday. Chest CT [facility]/20/2021 at [facility], images available but no report. FINDINGS: Cardiovascular: There are no filling defects within the pulmonary arteries to suggest pulmonary embolus. No aortic dissection or acute aortic findings. There is mild cardiomegaly. Mild contrast refluxing into the IVC. Trace pericardial effusion. Mediastinum/Nodes: Nonspecific right hilar adenopathy including a 19 mm infrahilar node series 5, image 61. There additional prominent and mildly enlarged right hilar lymph nodes. Scattered small mediastinal lymph nodes are not enlarged by size criteria. No esophageal wall thickening. Lungs/Pleura: Moderate right and small left pleural effusion, new from prior. There is peripheral subpleural reticulation consistent with underlying interstitial lung disease, chronic. New from prior exam diffuse ground-glass  opacity throughout the right upper and lower lobes. There is also mild ground-glass opacity in the perifissural and anterior left upper lobe. There are multiple small nodules in the right lower lobe which appear to be new from prior, for example series 6, image 69 in 63. Peripheral right lower lobe nodule with fiducial markers has increased in size currently 3.2 x 3.2 cm, previously 2.6 x 1.4 cm. Right middle lobe 8 mm nodule series 6, image 66, new. There is retained mucus within the right aspect of the trachea. Upper Abdomen: Contrast refluxing into the IVC. Nonobstructing left renal calculi. Musculoskeletal: There is marked body wall edema. 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 These are grossly stable. No vertebral body compression deformity or evidence of pathologic fracture. Review of the MIP images confirms the above findings. IMPRESSION: 1. No pulmonary embolus. 2. Moderate right and small left pleural effusions, new from prior exam. Ground-glass opacities throughout the right upper and lower lobes and to a lesser extent left upper lobe, favored to represent pulmonary edema in this setting. Possibility of atypical infection is also considered, lymphangitic spread of tumor is felt less likely. There is generalized body wall edema. 3. Right lower lobe nodule with fiducial markers has increased in size from prior exam. There are additional new pulmonary nodules in the right lower and right middle lobes, concerning for metastatic disease. Recommend continued oncologic follow-up. 4. Nonspecific right hilar adenopathy. 5. 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 Electronically Signed   By: Keith Rake M.D.   On: 09/24/2021 00:53    Procedures Procedures    Medications Ordered in ED Medications  diltiazem (CARDIZEM) 1 mg/mL load via infusion 10 mg (0 mg Intravenous Hold  09/23/21 2331)    And  diltiazem (CARDIZEM) 125 mg in dextrose 5% 125 mL (1 mg/mL) infusion (5 mg/hr Intravenous New Bag/Given 09/23/21 2330)  cefTRIAXone (ROCEPHIN) 1 g in sodium chloride 0.9 % 100 mL IVPB (has no administration in time range)  azithromycin (ZITHROMAX) 500 mg in sodium chloride 0.9 % 250 mL IVPB (has no administration in time range)  furosemide (LASIX) injection 40 mg (has no administration in time range)  iohexol (OMNIPAQUE) 350 MG/ML injection 75 mL (75 mLs Intravenous Contrast Given 09/24/21 0020)    ED Course/ Medical Decision Making/ A&P  Medical Decision Making Hypoxia to the 80s at home   Problems Addressed: Acute pulmonary edema ([place]): acute illness or injury    Details: lasix given and admission Atrial fibrillation with RVR ([place]): acute illness or injury with systemic symptoms    Details: diltiazem drip Hypoxia: acute illness or injury    Details: O2 with 2 L [place] Pleural effusion: acute illness or injury    Details: lasix initiated  Amount and/or Complexity of Data Reviewed Independent Historian: spouse    Details: see above Labs: ordered.    Details: all labs reviewed:  elevated BNP and troponin 39. negative covid and flu Sodium low at 129 normal potassium 3.8.  Normal creatinine.  Normal white count 10.2 and normal hemoglobin 14.2 Radiology: ordered.    Details: pleural effusions by me on CTA chest ECG/medicine tests: ordered and independent interpretation performed.    Details: AFIB with RVR rate 105  Risk Prescription drug management. Decision regarding hospitalization. Risk Details: Have started diltiazem drip and lasix and antibiotics given CTA findings.  On O2 and now resting comfortably. The patient appears reasonably stabilized for admission considering the current resources, flow, and capabilities available in the ED at this time, and I doubt any other [facility] requiring further screening and/or treatment in the ED prior to  admission.   Critical Care Total time providing critical care: 75 minutes (Diltiazem drip and review of CTA discussion with hospitalist )  Final Clinical Impression(s) / ED Diagnoses Final diagnoses:  Hypoxia  Malignant neoplasm of lung, unspecified laterality, unspecified part of lung (HCC)  Atrial fibrillation with RVR (HCC)  Acute pulmonary edema (HCC)  Pleural effusion  Elevated troponin I level    Rx / DC Orders ED Discharge Orders     None         Clinton Wahlberg, MD 09/24/21 5003

## 2021-09-24 NOTE — Consult Note (Signed)
WOC Nurse Consult Note: Reason for Consult:scattered full and partial thickness wounds in sacral area, likely related to shear forces Wound type:full and partial thickness Pressure Injury POA: Yes Measurement:Area of involvement (rather than individual lesions) to be measured by Bedside RN today and documented on Nursing Flow Sheet (LxW in cm) Wound bed:red, moist Drainage (amount, consistency, odor) scant serous Periwound: intact Dressing procedure/placement/frequency: Patient is on a mattress replacement while in the ICU that provides a low CoF and low air loss feature to mitigate moist microclimate. I have provided guidance for turning and repositioning form side to side and to minimize time in the supine position and topical care using a daily cleanse with house skin cleanser followed by covering the lesions with an antimicrobial nonadherent (xeroform) topped with dry gauze and secured with our house silicone foam dressing for the sacrum. I have provided a pressure redistribution chair cushion for use here when OOB in chair and this is to be sent home with the patient upon discharge.    [place] nursing team will not follow, but will remain available to this patient, the nursing and medical teams.  Please re-consult if needed. Thanks, Maudie Flakes, MSN, RN, [place], Arther Abbott  Pager# [phone]

## 2021-09-25 ENCOUNTER — Inpatient Hospital Stay (HOSPITAL_COMMUNITY): Payer: No Typology Code available for payment source

## 2021-09-25 DIAGNOSIS — J9601 Acute respiratory failure with hypoxia: Secondary | ICD-10-CM | POA: Diagnosis not present

## 2021-09-25 LAB — BASIC METABOLIC PANEL
Anion gap: 11 (ref 5–15)
Anion gap: 8 (ref 5–15)
BUN: 15 mg/dL (ref 8–23)
BUN: 19 mg/dL (ref 8–23)
CO2: 25 mmol/L (ref 22–32)
CO2: 26 mmol/L (ref 22–32)
Calcium: 8.2 mg/dL — ABNORMAL LOW (ref 8.9–10.3)
Calcium: 7.3 mg/dL — ABNORMAL LOW (ref 8.9–10.3)
Chloride: 96 mmol/L — ABNORMAL LOW (ref 98–111)
Chloride: 95 mmol/L — ABNORMAL LOW (ref 98–111)
Creatinine, Ser: 0.5 mg/dL — ABNORMAL LOW (ref 0.61–1.24)
Creatinine, Ser: 0.55 mg/dL — ABNORMAL LOW (ref 0.61–1.24)
GFR, Estimated: >60 mL/min (ref >60)
GFR, Estimated: >60 mL/min (ref >60)
Glucose, Bld: 122 mg/dL — ABNORMAL HIGH (ref 70–99)
Glucose, Bld: 207 mg/dL — ABNORMAL HIGH (ref 70–99)
Potassium: 3.4 mmol/L — ABNORMAL LOW (ref 3.5–5.1)
Potassium: 4.5 mmol/L (ref 3.5–5.1)
Sodium: 132 mmol/L — ABNORMAL LOW (ref 135–145)
Sodium: 129 mmol/L — ABNORMAL LOW (ref 135–145)

## 2021-09-25 LAB — CBC
HCT: 42.6 % (ref 39.0–52.0)
HCT: 36.5 % — ABNORMAL LOW (ref 39.0–52.0)
HCT: 32.7 % — ABNORMAL LOW (ref 39.0–52.0)
Hemoglobin: 13.7 g/dL (ref 13.0–17.0)
Hemoglobin: 11.7 g/dL — ABNORMAL LOW (ref 13.0–17.0)
Hemoglobin: 10.2 g/dL — ABNORMAL LOW (ref 13.0–17.0)
MCH: 25.7 pg — ABNORMAL LOW (ref 26.0–34.0)
MCH: 25.9 pg — ABNORMAL LOW (ref 26.0–34.0)
MCH: 25.1 pg — ABNORMAL LOW (ref 26.0–34.0)
MCHC: 32.2 g/dL (ref 30.0–36.0)
MCHC: 32.1 g/dL (ref 30.0–36.0)
MCHC: 31.2 g/dL (ref 30.0–36.0)
MCV: 79.8 fL — ABNORMAL LOW (ref 80.0–100.0)
MCV: 80.8 fL (ref 80.0–100.0)
MCV: 80.5 fL (ref 80.0–100.0)
Platelets: 165 10*3/uL (ref 150–400)
Platelets: 184 10*3/uL (ref 150–400)
Platelets: 181 10*3/uL (ref 150–400)
RBC: 5.34 MIL/uL (ref 4.22–5.81)
RBC: 4.52 MIL/uL (ref 4.22–5.81)
RBC: 4.06 MIL/uL — ABNORMAL LOW (ref 4.22–5.81)
RDW: 20.4 % — ABNORMAL HIGH (ref 11.5–15.5)
RDW: 20.1 % — ABNORMAL HIGH (ref 11.5–15.5)
RDW: 19.7 % — ABNORMAL HIGH (ref 11.5–15.5)
WBC: 8.8 10*3/uL (ref 4.0–10.5)
WBC: 10.1 10*3/uL (ref 4.0–10.5)
WBC: 9.2 10*3/uL (ref 4.0–10.5)
nRBC: 0.3 % — ABNORMAL HIGH (ref 0.0–0.2)
nRBC: 0.5 % — ABNORMAL HIGH (ref 0.0–0.2)
nRBC: 0.3 % — ABNORMAL HIGH (ref 0.0–0.2)

## 2021-09-25 LAB — BLOOD GAS, ARTERIAL
Acid-Base Excess: 6.9 mmol/L — ABNORMAL HIGH (ref 0.0–2.0)
Acid-Base Excess: 4 mmol/L — ABNORMAL HIGH (ref 0.0–2.0)
Bicarbonate: 34.1 mmol/L — ABNORMAL HIGH (ref 20.0–28.0)
Bicarbonate: 32.5 mmol/L — ABNORMAL HIGH (ref 20.0–28.0)
Drawn by: 560031
Drawn by: 23281
FIO2: 60 %
MECHVT: 490 mL
O2 Saturation: 98.2 %
O2 Saturation: 98.7 %
PEEP: 5 cmH2O
Patient temperature: 36.6
Patient temperature: 36.3
RATE: 20 resp/min
pCO2 arterial: 58 mmHg — ABNORMAL HIGH (ref 32–48)
pCO2 arterial: 64 mmHg — ABNORMAL HIGH (ref 32–48)
pH, Arterial: 7.38 (ref 7.35–7.45)
pH, Arterial: 7.31 — ABNORMAL LOW (ref 7.35–7.45)
pO2, Arterial: 108 mmHg (ref 83–108)
pO2, Arterial: 156 mmHg — ABNORMAL HIGH (ref 83–108)

## 2021-09-25 LAB — URINE CULTURE: Culture: NO GROWTH

## 2021-09-25 LAB — MAGNESIUM: Magnesium: 1.8 mg/dL (ref 1.7–2.4)

## 2021-09-25 LAB — POTASSIUM: Potassium: 4.8 mmol/L (ref 3.5–5.1)

## 2021-09-25 LAB — LACTIC ACID, PLASMA
Lactic Acid, Venous: 2.4 mmol/L (ref 0.5–1.9)
Lactic Acid, Venous: 2.8 mmol/L (ref 0.5–1.9)

## 2021-09-25 LAB — GLUCOSE, CAPILLARY
Glucose-Capillary: 180 mg/dL — ABNORMAL HIGH (ref 70–99)
Glucose-Capillary: 199 mg/dL — ABNORMAL HIGH (ref 70–99)

## 2021-09-25 LAB — TSH: TSH: 2.208 u[IU]/mL (ref 0.350–4.500)

## 2021-09-25 LAB — PROTIME-INR
INR: 1.8 — ABNORMAL HIGH (ref 0.8–1.2)
Prothrombin Time: 20.9 seconds — ABNORMAL HIGH (ref 11.4–15.2)

## 2021-09-25 LAB — AMMONIA: Ammonia: 48 umol/L — ABNORMAL HIGH (ref 9–35)

## 2021-09-25 LAB — PHOSPHORUS
Phosphorus: <1 mg/dL — CL (ref 2.5–4.6)
Phosphorus: 2.9 mg/dL (ref 2.5–4.6)

## 2021-09-25 LAB — APTT: aPTT: 32 seconds (ref 24–36)

## 2021-09-25 LAB — HEPARIN LEVEL (UNFRACTIONATED): Heparin Unfractionated: >1.1 IU/mL — ABNORMAL HIGH (ref 0.30–0.70)

## 2021-09-25 MED ORDER — FENTANYL 2500MCG IN NS 250ML (10MCG/ML) PREMIX INFUSION
25.0000 ug/h | INTRAVENOUS | Status: DC
  Administered 2021-09-25: 25 ug/h via INTRAVENOUS
  Administered 2021-09-27: 75 ug/h via INTRAVENOUS
  Filled 2021-09-25 (×2): qty 250

## 2021-09-25 MED ORDER — DEXTROSE 5 % IV SOLN
30.0000 mmol | Freq: Once | INTRAVENOUS | Status: AC
  Administered 2021-09-25: 30 mmol via INTRAVENOUS
  Filled 2021-09-25: qty 10

## 2021-09-25 MED ORDER — DILTIAZEM HCL ER COATED BEADS 180 MG PO CP24
180.0000 mg | ORAL_CAPSULE | Freq: Every day | ORAL | Status: DC
  Administered 2021-09-25: 180 mg via ORAL
  Filled 2021-09-25 (×2): qty 1

## 2021-09-25 MED ORDER — PHENYLEPHRINE 80 MCG/ML (10ML) SYRINGE FOR IV PUSH (FOR BLOOD PRESSURE SUPPORT): 80.0000 ug | PREFILLED_SYRINGE | INTRAVENOUS | Status: DC | PRN

## 2021-09-25 MED ORDER — PROPOFOL 1000 MG/100ML IV EMUL
0.0000 ug/kg/min | INTRAVENOUS | Status: DC
  Administered 2021-09-25: 2 ug/kg/min via INTRAVENOUS
  Administered 2021-09-26 – 2021-09-27 (×2): 10 ug/kg/min via INTRAVENOUS
  Filled 2021-09-25 (×3): qty 100

## 2021-09-25 MED ORDER — PANTOPRAZOLE 2 MG/ML SUSPENSION
40.0000 mg | Freq: Every day | ORAL | Status: DC
  Administered 2021-09-26: 40 mg
  Filled 2021-09-25: qty 20

## 2021-09-25 MED ORDER — PHENYLEPHRINE 80 MCG/ML (10ML) SYRINGE FOR IV PUSH (FOR BLOOD PRESSURE SUPPORT): 200.0000 ug | PREFILLED_SYRINGE | INTRAVENOUS | Status: DC | PRN

## 2021-09-25 MED ORDER — NOREPINEPHRINE 4 MG/250ML-% IV SOLN
2.0000 ug/min | INTRAVENOUS | Status: DC
  Administered 2021-09-25: 5 ug/min via INTRAVENOUS
  Administered 2021-09-26: 3 ug/min via INTRAVENOUS
  Filled 2021-09-25 (×2): qty 250

## 2021-09-25 MED ORDER — K PHOS MONO-SOD PHOS DI & MONO 155-852-130 MG PO TABS
500.0000 mg | ORAL_TABLET | Freq: Two times a day (BID) | ORAL | Status: DC
  Administered 2021-09-25 – 2021-09-26 (×3): 500 mg
  Filled 2021-09-25 (×4): qty 2

## 2021-09-25 MED ORDER — FENTANYL CITRATE PF 50 MCG/ML IJ SOSY: 25.0000 ug | PREFILLED_SYRINGE | Freq: Once | INTRAMUSCULAR | Status: DC

## 2021-09-25 MED ORDER — POTASSIUM CHLORIDE CRYS ER 20 MEQ PO TBCR: 20.0000 meq | EXTENDED_RELEASE_TABLET | Freq: Once | ORAL | Status: DC

## 2021-09-25 MED ORDER — PHENYLEPHRINE 80 MCG/ML (10ML) SYRINGE FOR IV PUSH (FOR BLOOD PRESSURE SUPPORT)
PREFILLED_SYRINGE | INTRAVENOUS | Status: AC
  Administered 2021-09-25: 800 ug via INTRAVENOUS
  Filled 2021-09-25: qty 10

## 2021-09-25 MED ORDER — PHENYLEPHRINE 80 MCG/ML (10ML) SYRINGE FOR IV PUSH (FOR BLOOD PRESSURE SUPPORT)
PREFILLED_SYRINGE | INTRAVENOUS | Status: AC
  Filled 2021-09-25: qty 20

## 2021-09-25 MED ORDER — SODIUM CHLORIDE 0.9 % IV SOLN: INTRAVENOUS | Status: DC

## 2021-09-25 MED ORDER — DOCUSATE SODIUM 50 MG/5ML PO LIQD
100.0000 mg | Freq: Two times a day (BID) | ORAL | Status: DC
  Administered 2021-09-26: 100 mg
  Filled 2021-09-25 (×2): qty 10

## 2021-09-25 MED ORDER — POLYETHYLENE GLYCOL 3350 17 G PO PACK
17.0000 g | PACK | Freq: Every day | ORAL | Status: DC
  Filled 2021-09-25: qty 1

## 2021-09-25 MED ORDER — ROCURONIUM BROMIDE 10 MG/ML (PF) SYRINGE
PREFILLED_SYRINGE | INTRAVENOUS | Status: AC
  Administered 2021-09-25: 100 mg
  Filled 2021-09-25: qty 10

## 2021-09-25 MED ORDER — FUROSEMIDE 10 MG/ML IJ SOLN: 40.0000 mg | Freq: Every day | INTRAMUSCULAR | Status: DC

## 2021-09-25 MED ORDER — PROTHROMBIN COMPLEX CONC HUMAN 500 UNITS IV KIT
3779.0000 [IU] | PACK | Status: AC
  Administered 2021-09-25: 3779 [IU] via INTRAVENOUS
  Filled 2021-09-25: qty 549

## 2021-09-25 MED ORDER — IOHEXOL 350 MG/ML SOLN
100.0000 mL | Freq: Once | INTRAVENOUS | Status: AC | PRN
  Administered 2021-09-25: 100 mL via INTRAVENOUS

## 2021-09-25 MED ORDER — SODIUM CHLORIDE 0.9% IV SOLUTION: Freq: Once | INTRAVENOUS | Status: AC

## 2021-09-25 MED ORDER — K PHOS MONO-SOD PHOS DI & MONO 155-852-130 MG PO TABS
500.0000 mg | ORAL_TABLET | Freq: Two times a day (BID) | ORAL | Status: DC
  Administered 2021-09-25: 500 mg via ORAL
  Filled 2021-09-25 (×2): qty 2

## 2021-09-25 MED ORDER — FUROSEMIDE 10 MG/ML IJ SOLN
40.0000 mg | Freq: Once | INTRAMUSCULAR | Status: DC
Start: 2021-09-25 — End: 2021-09-29

## 2021-09-25 MED ORDER — FENTANYL BOLUS VIA INFUSION
25.0000 ug | INTRAVENOUS | Status: DC | PRN
  Administered 2021-09-26: 50 ug via INTRAVENOUS
  Administered 2021-09-27 (×2): 100 ug via INTRAVENOUS

## 2021-09-25 MED ORDER — ORAL CARE MOUTH RINSE
15.0000 mL | OROMUCOSAL | Status: DC
  Administered 2021-09-26 – 2021-09-27 (×19): 15 mL via OROMUCOSAL

## 2021-09-25 MED ORDER — FENTANYL CITRATE (PF) 100 MCG/2ML IJ SOLN
INTRAMUSCULAR | Status: AC
  Administered 2021-09-25: 50 ug
  Filled 2021-09-25: qty 2

## 2021-09-25 MED ORDER — POTASSIUM PHOSPHATES 15 MMOLE/5ML IV SOLN
30.0000 mmol | Freq: Once | INTRAVENOUS | Status: AC
  Administered 2021-09-25: 30 mmol via INTRAVENOUS
  Filled 2021-09-25: qty 10

## 2021-09-25 MED ORDER — FENTANYL CITRATE PF 50 MCG/ML IJ SOSY
PREFILLED_SYRINGE | INTRAMUSCULAR | Status: AC
  Filled 2021-09-25: qty 2

## 2021-09-25 MED ORDER — ETOMIDATE 2 MG/ML IV SOLN
INTRAVENOUS | Status: AC
  Administered 2021-09-25: 20 mg
  Filled 2021-09-25: qty 20

## 2021-09-25 MED ORDER — MIDAZOLAM HCL 2 MG/2ML IJ SOLN
INTRAMUSCULAR | Status: AC
  Administered 2021-09-25: 2 mg
  Filled 2021-09-25: qty 2

## 2021-09-25 MED ORDER — MAGNESIUM SULFATE 4 GM/100ML IV SOLN
4.0000 g | Freq: Once | INTRAVENOUS | Status: AC
  Administered 2021-09-25: 4 g via INTRAVENOUS
  Filled 2021-09-25: qty 100

## 2021-09-25 MED ORDER — CHLORHEXIDINE GLUCONATE 0.12% ORAL RINSE (MEDLINE KIT)
15.0000 mL | Freq: Two times a day (BID) | OROMUCOSAL | Status: DC
  Administered 2021-09-26 – 2021-09-27 (×5): 15 mL via OROMUCOSAL

## 2021-09-25 MED ORDER — SODIUM CHLORIDE 0.9 % IV SOLN
250.0000 mL | INTRAVENOUS | Status: DC
  Administered 2021-09-26: 250 mL via INTRAVENOUS

## 2021-09-25 NOTE — Evaluation (Signed)
Physical Therapy Evaluation Patient Details Name: [name] MRN: [id number] DOB: Apr 02, 1946 Today's Date: 09/25/2021  History of Present Illness  Patient is a 76 year old male who was diagnosed with UTI on 5/17 at the [place]. patient was noted to have O2 in 80s on RA in ED. patient was found to have acute hypoxic respiratory failure, acute on chronic HFpEF, a fib with RVR, acute encephalopathy, UTI and acute urinary retention.TIW:PYKDXI fibrillation, interstitial lung disease, and small cell lung cancer  Clinical Impression  Patient admitted for above medical issues. Patient on RA , resting in bed.Patient  mobilized and ambulated x 25' using RW with min assistance. Patient requires frequent cues for safety  and direction for activity.  PTA , patient independent ambulator until recently required use of RW. Patient presents with decreased endurance, decreased safety awareness. Patient's wife in room and assisting with questions. Pt admitted with above diagnosis.  Pt currently with functional limitations due to the deficits listed below (see PT Problem List). Pt will benefit from skilled PT to increase their independence and safety with mobility to allow discharge to the venue listed below.    SPO2 94 % while ambulating  on RA.sign         Recommendations for follow up therapy are one component of a multi-disciplinary discharge planning process, led by the attending physician.  Recommendations may be updated based on patient status, additional functional criteria and insurance authorization.  Follow Up Recommendations Home health PT    Assistance Recommended at Discharge Frequent or constant Supervision/Assistance  Patient can return home with the following  A little help with walking and/or transfers;A little help with bathing/dressing/bathroom;Help with stairs or ramp for entrance;Assistance with cooking/housework;Assist for transportation    Equipment Recommendations None recommended by  PT  Recommendations for Other Services       Functional Status Assessment Patient has had a recent decline in their functional status and demonstrates the ability to make significant improvements in function in a reasonable and predictable amount of time.     Precautions / Restrictions Precautions Precautions: Fall Precaution Comments: monitor O2 and HR      Mobility  Bed Mobility   Bed Mobility: Supine to Sit     Supine to sit: Min assist, HOB elevated     General bed mobility comments: with increased time and line management    Transfers Overall transfer level: Needs assistance Equipment used: Rolling walker (2 wheels) Transfers: Sit to/from Stand Sit to Stand: Min assist           General transfer comment: steady assistance    Ambulation/Gait Ambulation/Gait assistance: Min Web designer (Feet): 25 Feet Assistive device: Rolling walker (2 wheels) Gait Pattern/deviations: Step-through pattern Gait velocity: decr     General Gait Details: multimodal ciues for safety, turns and where to go in room  Stairs            Wheelchair Mobility    Modified Rankin (Stroke Patients Only)       Balance Overall balance assessment: Needs assistance Sitting-balance support: No upper extremity supported, Feet supported Sitting balance-Leahy Scale: Fair     Standing balance support: Bilateral upper extremity supported, During functional activity, Reliant on assistive device for balance Standing balance-Leahy Scale: Poor                               Pertinent Vitals/Pain Pain Assessment Pain Assessment: No/denies pain    Home  Living Family/patient expects to be discharged to:: Private residence Living Arrangements: Spouse/significant other Available Help at Discharge: Family;Available 24 hours/day Type of Home: House Home Access: Level entry       Home Layout: One level Home Equipment: Conservation officer, nature (2 wheels)      Prior  Function Prior Level of Function : Needs assist  Cognitive Assist : Mobility (cognitive) Mobility (Cognitive): Set up cues   Physical Assist : Mobility (physical) Mobility (physical): Transfers   Mobility Comments: has been using RW prior to ADmissionm       Hand Dominance   Dominant Hand: Right    Extremity/Trunk Assessment   Upper Extremity Assessment Upper Extremity Assessment: Defer to OT evaluation RUE Deficits / Details: patients ROM was [facility] but strength was 4-/5 for tasks. patient was able to complete [employer] to open meal items.    Lower Extremity Assessment Lower Extremity Assessment: Overall WFL for tasks assessed    Cervical / Trunk Assessment Cervical / Trunk Assessment: Normal  Communication   Communication: No difficulties  Cognition Arousal/Alertness: Awake/alert Behavior During Therapy: WFL for tasks assessed/performed Overall Cognitive Status: Impaired/Different from baseline Area of Impairment: Orientation, Awareness                 Orientation Level: Situation         Awareness: Emergent   General Comments: wife reports changes in cognition, processing recently        General Comments      Exercises     Assessment/Plan    PT Assessment Patient needs continued PT services  PT Problem List Decreased strength;Decreased balance;Decreased cognition;Decreased mobility;Decreased activity tolerance;Decreased safety awareness       PT Treatment Interventions DME instruction;Therapeutic activities;Cognitive remediation;Gait training;Therapeutic exercise;Patient/family education;Functional mobility training    PT Goals (Current goals can be found in the Care Plan section)  Acute Rehab PT Goals Patient Stated Goal: agreed to ambulate PT Goal Formulation: With patient/family Time For Goal Achievement: 10/09/21 Potential to Achieve Goals: Fair    Frequency Min 3X/week     Co-evaluation PT/OT/SLP Co-Evaluation/Treatment: Yes Reason for  Co-Treatment: For patient/therapist safety PT goals addressed during session: Mobility/safety with mobility OT goals addressed during session: ADL's and self-care       AM-PAC PT "6 Clicks" Mobility  Outcome Measure Help needed turning from your back to your side while in a flat bed without using bedrails?: A Little Help needed moving from lying on your back to sitting on the side of a flat bed without using bedrails?: A Little Help needed moving to and from a bed to a chair (including a wheelchair)?: A Little Help needed standing up from a chair using your arms (e.g., wheelchair or bedside chair)?: A Little Help needed to walk in hospital room?: A Lot Help needed climbing 3-5 steps with a railing? : A Lot 6 Click Score: 16    End of Session Equipment Utilized During Treatment: Gait belt Activity Tolerance: Patient tolerated treatment well Patient left: in chair;with call bell/phone within reach;with chair alarm set;with family/visitor present Nurse Communication: Mobility status PT Visit Diagnosis: Muscle weakness (generalized) (M62.81);Difficulty in walking, not elsewhere classified (R26.2)    Time: 6948-5462 PT Time Calculation (min) (ACUTE ONLY): 22 min   Charges:   PT Evaluation $PT Eval Low Complexity: [place] [facility] Acute Rehabilitation Services Pager [phone] Office [phone]   Claretha Cooper 09/25/2021, 2:19 PM

## 2021-09-25 NOTE — Progress Notes (Signed)
Due to patient condition and high risk of needing blood transfusion, Dr. Ander Slade discussed possible transfusion need. Blood consent signed by wife in event transfusion necessary. Signed form in chart.

## 2021-09-25 NOTE — Consult Note (Signed)
Referring Provider: [facility] Primary Care Physician:  [facility], [facility] Primary Gastroenterologist:  Althia Forts ([facility])  Reason for Consultation:  Hematochezia  HPI: [name] is a 76 y.o. male with medical history significant for atrial fibrillation on Eliquis, interstitial lung disease, and small cell lung cancer, who presented to the emergency department 5/21 for evaluation of fatigue, confusion, shortness of breath, and low oxygen saturations.   Patient's wife reports starting this morning around 9:30 AM patient had large volume bright red blood per rectum.  Per nurse report it was about 800 mL.  Patient denies any previous rectal bleeding.  During time of bleeding patient had no drop in blood pressure or increase in heart rate.  Denies abdominal pain, rectal pain, melena, nausea, vomiting, constipation, diarrhea, hematemesis.  He is on Eliquis for A-fib.  Last dose this morning. Reports last colonoscopy 2 or 3 years ago at the [place].  Notes all previous colonoscopies have been normal without polyps.  Wife will attempt to retrieve colonoscopy report from [place].  She quit smoking 10 months ago.  20-pack-year history.  Denies alcohol use. Past Medical History:  Diagnosis Date   Arthritis    Cancer [facility])    Coronary artery calcification seen on CAT scan    Dysrhythmia    History of kidney stones    PAF (paroxysmal atrial fibrillation) ([place]) 08/02/2014   PTSD (post-traumatic stress disorder)    controlled    Past Surgical History:  Procedure Laterality Date   BACK SURGERY     2000   BRONCHIAL BIOPSY  08/08/2020   Procedure: BRONCHIAL BIOPSIES;  Surgeon: Collene Gobble, MD;  Location: [place];  Service: Cardiopulmonary;;   BRONCHIAL BRUSHINGS  08/08/2020   Procedure: BRONCHIAL BRUSHINGS;  Surgeon: Collene Gobble, MD;  Location: [place];  Service: Cardiopulmonary;;   BRONCHIAL NEEDLE ASPIRATION BIOPSY  08/08/2020   Procedure: BRONCHIAL NEEDLE ASPIRATION BIOPSIES;   Surgeon: Collene Gobble, MD;  Location: [place] [facility];  Service: Cardiopulmonary;;   CARPAL TUNNEL RELEASE Right    FIDUCIAL MARKER PLACEMENT  08/08/2020   Procedure: FIDUCIAL MARKER PLACEMENT;  Surgeon: Collene Gobble, MD;  Location: [place] [facility];  Service: Cardiopulmonary;;   JOINT REPLACEMENT      left 2005   SHOULDER ARTHROSCOPY W/ ROTATOR CUFF REPAIR  2012   right   TOTAL KNEE ARTHROPLASTY  08/28/2011   Procedure: TOTAL KNEE ARTHROPLASTY;  Surgeon: Garald Balding, MD;  Location: [place];  Service: Orthopedics;  Laterality: Right;   VIDEO BRONCHOSCOPY WITH ENDOBRONCHIAL NAVIGATION  08/08/2020   Procedure: VIDEO BRONCHOSCOPY WITH ENDOBRONCHIAL NAVIGATION;  Surgeon: Collene Gobble, MD;  Location: [facility];  Service: Cardiopulmonary;;    Prior to Admission medications   Medication Sig Start Date End Date Taking? Authorizing Provider  apixaban (ELIQUIS) 5 MG TABS tablet Take 5 mg by mouth 2 (two) times daily.   Yes [provider]  Brompheniramine-Pseudoeph (BROMALINE PO) Take 2 capsules by mouth daily.   Yes [provider]  GLUTATHIONE PO Take 5 mLs by mouth daily.   Yes [provider]  Nintedanib (OFEV) 150 MG CAPS Take 1 capsule (150 mg total) by mouth 2 (two) times daily. Patient taking differently: Take 150 mg by mouth daily. 05/26/21  Yes Mannam, Praveen, MD  OVER THE COUNTER MEDICATION Take 2 capsules by mouth daily. Probiotic + Prebiotic + Postbiotic Combination. Ageless Biome by [employer].   Yes [provider]  OVER THE COUNTER MEDICATION Apply 5 drops topically 2 (two) times  daily. CBD+ Blackseed Oil Combination   Yes [provider]  OVER THE COUNTER MEDICATION Take 2 capsules by mouth daily. Fuceidan   Yes [provider]  phosphorus (K PHOS NEUTRAL) [id number] MG tablet Take 2 tablets by mouth in the morning and at bedtime. 05/03/21  Yes [provider]  vitamin C (ASCORBIC ACID) 500 MG tablet Take 500 mg by  mouth daily.   Yes [provider]  Zinc 50 MG TABS Take 50 mg by mouth daily.   Yes [provider]    Scheduled Meds:  Chlorhexidine Gluconate Cloth  6 each Topical Q0600   diltiazem  180 mg Oral Daily   furosemide  40 mg Intravenous Q12H   mouth rinse  15 mL Mouth Rinse BID   Nintedanib  150 mg Oral BID   phosphorus  500 mg Oral BID   sodium chloride flush  3 mL Intravenous Q12H   Continuous Infusions:  cefTRIAXone (ROCEPHIN)  IV Stopped (09/25/21 0113)   magnesium sulfate bolus IVPB     potassium PHOSPHATE IVPB (in mmol) 85 mL/hr at 09/25/21 0600   potassium PHOSPHATE IVPB (in mmol)     PRN Meds:.acetaminophen **OR** acetaminophen, guaiFENesin, ondansetron **OR** ondansetron (ZOFRAN) IV, senna-docusate, traZODone  Allergies as of 09/23/2021   (No Known Allergies)    Family History  Problem Relation Age of Onset   Lung cancer Mother    Kidney cancer Father    Heart attack Maternal Grandfather    Alcoholism Brother    HIV Brother    Anesthesia problems Neg Hx    Hypotension Neg Hx    Malignant hyperthermia Neg Hx    Pseudochol deficiency Neg Hx     Social History   Socioeconomic History   Marital status: Married    Spouse name: Not on file   Number of children: Not on file   Years of education: Not on file   Highest education level: Not on file  Occupational History   Not on file  Tobacco Use   Smoking status: Former    Packs/day: 0.50    Years: 40.00    Pack years: 20.00    Types: Cigarettes    Start date: 67    Quit date: 11/04/2020    Years since quitting: 0.8   Smokeless tobacco: Never   Tobacco comments:    Stopped smoking after cancer diagnosis.  Vaping Use   Vaping Use: Never used  Substance and Sexual Activity   Alcohol use: No   Drug use: No   Sexual activity: Yes    Comment: Viagra at times  Other Topics Concern   Not on file  Social History Narrative   Not on file   Social Determinants of Health   Financial  Resource Strain: Not on file  Food Insecurity: Not on file  Transportation Needs: Not on file  Physical Activity: Not on file  Stress: Not on file  Social Connections: Not on file  Intimate Partner Violence: Not on file    Review of Systems: Review of Systems  Constitutional:  Positive for malaise/fatigue. Negative for chills and fever.  HENT:  Negative for hearing loss and tinnitus.   Eyes:  Negative for blurred vision and double vision.  Respiratory:  Negative for cough and hemoptysis.   Cardiovascular:  Negative for chest pain and palpitations.  Gastrointestinal:  Positive for blood in stool. Negative for abdominal pain, constipation, diarrhea, heartburn, melena, nausea and vomiting.  Genitourinary:  Negative for dysuria and urgency.  Musculoskeletal:  Negative for myalgias and neck pain.  Skin:  Negative for itching and rash.  Neurological:  Negative for dizziness and headaches.  Endo/Heme/Allergies:  Negative for environmental allergies. Does not bruise/bleed easily.  Psychiatric/Behavioral:  Negative for depression and substance abuse.     Physical Exam:Physical Exam Constitutional:      General: He is not in acute distress.    Appearance: He is normal weight.  HENT:     Head: Normocephalic and atraumatic.     Right Ear: External ear normal.     Left Ear: External ear normal.     Nose: Nose normal.     Mouth/Throat:     Mouth: Mucous membranes are moist.  Eyes:     Pupils: Pupils are equal, round, and reactive to light.  Cardiovascular:     Rate and Rhythm: Normal rate. Rhythm irregular.     Pulses: Normal pulses.     Heart sounds: Normal heart sounds.  Pulmonary:     Effort: Pulmonary effort is normal.     Breath sounds: Normal breath sounds.  Abdominal:     General: Abdomen is flat. Bowel sounds are normal. There is no distension.     Palpations: Abdomen is soft. There is no mass.     Tenderness: There is no abdominal tenderness. There is no guarding or rebound.      Hernia: No hernia is present.  Musculoskeletal:        General: Normal range of motion.     Cervical back: Normal range of motion and neck supple.  Skin:    General: Skin is warm and dry.  Neurological:     General: No focal deficit present.     Mental Status: He is alert and oriented to person, place, and time. Mental status is at baseline.  Psychiatric:        Mood and Affect: Mood normal.        Behavior: Behavior normal.    Vital signs: Vitals:   09/25/21 0900 09/25/21 1000  BP:  129/77  Pulse: (!) 59 91  Resp: (!) 22 (!) 23  Temp:    SpO2: 92% 93%   Last BM Date : 09/23/21    GI:  Lab Results: Recent Labs    09/23/21 2305 09/24/21 0524 09/25/21 0304  WBC 10.2 8.9 8.8  HGB 14.2 13.1 13.7  HCT 43.9 41.4 42.6  PLT 184 168 165   BMET Recent Labs    09/23/21 2305 09/24/21 0524 09/25/21 0304  NA 129* 133* 132*  K 3.8 3.2* 3.4*  CL 96* 98 96*  CO2 25 24 25   GLUCOSE 140* 133* 122*  BUN 17 16 15   CREATININE 0.53* 0.38* 0.50*  CALCIUM 8.2* 8.2* 8.2*   LFT No results for input(s): PROT, ALBUMIN, AST, ALT, ALKPHOS, BILITOT, BILIDIR, IBILI in the last 72 hours. PT/INR No results for input(s): LABPROT, INR in the last 72 hours.   Studies/Results: DG Chest 2 View  Result Date: 09/23/2021 CLINICAL DATA:  Shortness of breath. Low oxygen saturation. History of lung cancer and pulmonary fibrosis. EXAM: CHEST - 2 VIEW COMPARISON:  Radiograph 08/18/2020, CT 07/14/2020 FINDINGS: Chronic cardiomegaly. Fiducial markers at the right lung base. There are fine interstitial opacities are increased from prior exam, particularly in the right upper lobe. Peripheral subpleural opacity consistent with interstitial lung disease. Possible small right pleural effusion. No pneumothorax. IMPRESSION: Increased fine interstitial opacities, particularly in the right upper lobe, may be pulmonary edema, and progression in pulmonary fibrosis  or atypical infection. Possible small right  pleural effusion. Electronically Signed   By: Keith Rake M.D.   On: 09/23/2021 23:03   CT Angio Chest PE W and/or Wo Contrast  Result Date: 09/24/2021 CLINICAL DATA:  Lung/mediastinal abscess Shortness of breath.  History of lung cancer and pulmonary fibrosis. EXAM: CT ANGIOGRAPHY CHEST WITH CONTRAST TECHNIQUE: Multidetector CT imaging of the chest was performed using the standard protocol during bolus administration of intravenous contrast. Multiplanar CT image reconstructions and MIPs were obtained to evaluate the vascular anatomy. RADIATION DOSE REDUCTION: This exam was performed according to the departmental dose-optimization program which includes automated exposure control, adjustment of the mA and/or kV according to patient size and/or use of iterative reconstruction technique. CONTRAST:  32mL OMNIPAQUE IOHEXOL 350 MG/ML SOLN COMPARISON:  Radiograph yesterday. Chest CT 06/20/2021 at [facility], images available but no report. FINDINGS: Cardiovascular: There are no filling defects within the pulmonary arteries to suggest pulmonary embolus. No aortic dissection or acute aortic findings. There is mild cardiomegaly. Mild contrast refluxing into the IVC. Trace pericardial effusion. Mediastinum/Nodes: Nonspecific right hilar adenopathy including a 19 mm infrahilar node series 5, image 61. There additional prominent and mildly enlarged right hilar lymph nodes. Scattered small mediastinal lymph nodes are not enlarged by size criteria. No esophageal wall thickening. Lungs/Pleura: Moderate right and small left pleural effusion, new from prior. There is peripheral subpleural reticulation consistent with underlying interstitial lung disease, chronic. New from prior exam diffuse ground-glass opacity throughout the right upper and lower lobes. There is also mild ground-glass opacity in the perifissural and anterior left upper lobe. There are multiple small nodules in the right lower lobe which appear to  be new from prior, for example series 6, image 69 in 63. Peripheral right lower lobe nodule with fiducial markers has increased in size currently 3.2 x 3.2 cm, previously 2.6 x 1.4 cm. Right middle lobe 8 mm nodule series 6, image 66, new. There is retained mucus within the right aspect of the trachea. Upper Abdomen: Contrast refluxing into the IVC. Nonobstructing left renal calculi. Musculoskeletal: There is marked body wall edema. 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 These are grossly stable. No vertebral body compression deformity or evidence of pathologic fracture. Review of the MIP images confirms the above findings. IMPRESSION: 1. No pulmonary embolus. 2. Moderate right and small left pleural effusions, new from prior exam. Ground-glass opacities throughout the right upper and lower lobes and to a lesser extent left upper lobe, favored to represent pulmonary edema in this setting. Possibility of atypical infection is also considered, lymphangitic spread of tumor is felt less likely. There is generalized body wall edema. 3. Right lower lobe nodule with fiducial markers has increased in size from prior exam. There are additional new pulmonary nodules in the right lower and right middle lobes, concerning for metastatic disease. Recommend continued oncologic follow-up. 4. Nonspecific right hilar adenopathy. 5. 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 Electronically Signed   By: Keith Rake M.D.   On: 09/24/2021 00:53   DG Chest Port 1 View  Result Date: 09/25/2021 CLINICAL DATA:  Acute respiratory failure with hypoxia. EXAM: PORTABLE CHEST 1 VIEW COMPARISON:  Chest radiographs 09/23/2021 and chest CTA 09/24/2021 FINDINGS: The cardiomediastinal silhouette is unchanged. There is a persistent small right pleural effusion. Fiducial markers are again noted in the right lower  lobe. Interstitial and hazy airspace opacities bilaterally are stable to slightly improved. No  pneumothorax is identified. No acute osseous abnormality is seen. IMPRESSION: 1. Stable to slightly improved bilateral lung opacities which may reflect edema or infection. 2. Persistent small right pleural effusion. Electronically Signed   By: Logan Bores M.D.   On: 09/25/2021 08:15   ECHOCARDIOGRAM COMPLETE  Result Date: 09/24/2021    ECHOCARDIOGRAM REPORT   Patient Name:   [name] Date of Exam: 09/24/2021 Medical Rec #:  [id number]        Height:       74.0 in Accession #:    [id number]       Weight:       182.1 lb Date of Birth:  Apr 10, 1946        BSA:          2.089 m Patient Age:    58 years         BP:           147/70 mmHg Patient Gender: M                HR:           85 bpm. Exam Location:  Inpatient Procedure: 2D Echo, Color Doppler and Cardiac Doppler Indications:    Congestive heart failure  History:        Patient has prior history of Echocardiogram examinations, most                 recent 08/21/2016. Arrythmias:Atrial Fibrillation.  Sonographer:    Joette Catching RCS Referring Phys: [phone] [place]  1. Left ventricular ejection fraction, by estimation, is 60 to 65%. The left ventricle has normal function. The left ventricle has no regional wall motion abnormalities. There is moderate asymmetric left ventricular hypertrophy of the septal segment. Left ventricular diastolic function could not be evaluated.  2. Right ventricular systolic function is mildly reduced. The right ventricular size is normal. Mildly increased right ventricular wall thickness. There is mildly elevated pulmonary artery systolic pressure.  3. Left atrial size was moderately dilated.  4. Right atrial size was moderately dilated.  5. The mitral valve is normal in structure. Mild mitral valve regurgitation.  6. Tricuspid valve regurgitation is mild to moderate.  7. The aortic valve is tricuspid. Aortic valve  regurgitation is not visualized. No aortic stenosis is present.  8. The inferior vena cava is normal in size with greater than 50% respiratory variability, suggesting right atrial pressure of 3 mmHg. Comparison(s): Consider evaluation for infiltrative cardiomyopathy (e.g. amyloidosis). FINDINGS  Left Ventricle: Left ventricular ejection fraction, by estimation, is 60 to 65%. The left ventricle has normal function. The left ventricle has no regional wall motion abnormalities. The left ventricular internal cavity size was normal in size. There is  moderate asymmetric left ventricular hypertrophy of the septal segment. Left ventricular diastolic function could not be evaluated due to atrial fibrillation. Left ventricular diastolic function could not be evaluated. Right Ventricle: The right ventricular size is normal. Mildly increased right ventricular wall thickness. Right ventricular systolic function is mildly reduced. There is mildly elevated pulmonary artery systolic pressure. The tricuspid regurgitant velocity is 3.02 m/s, and with an assumed right atrial pressure of 3 mmHg, the estimated right ventricular systolic pressure is 02.5 mmHg. Left Atrium: Left atrial size was moderately dilated. Right Atrium: Right atrial size was moderately dilated. Pericardium: There is no evidence of pericardial effusion. Mitral Valve: The mitral valve is normal in structure. Mild mitral valve regurgitation, with centrally-directed jet. Tricuspid Valve: The tricuspid valve  is normal in structure. Tricuspid valve regurgitation is mild to moderate. Aortic Valve: The aortic valve is tricuspid. Aortic valve regurgitation is not visualized. No aortic stenosis is present. Aortic valve mean gradient measures 5.0 mmHg. Aortic valve peak gradient measures 7.8 mmHg. Aortic valve area, by VTI measures 3.57 cm. Pulmonic Valve: The pulmonic valve was normal in structure. Pulmonic valve regurgitation is not visualized. Aorta: The aortic root and  ascending aorta are structurally normal, with no evidence of dilitation. Venous: The inferior vena cava is normal in size with greater than 50% respiratory variability, suggesting right atrial pressure of 3 mmHg. IAS/Shunts: No atrial level shunt detected by color flow Doppler.  LEFT VENTRICLE PLAX 2D LVIDd:         4.00 cm     Diastology LVIDs:         2.80 cm     LV e' medial:    5.55 cm/s LV PW:         1.60 cm     LV E/e' medial:  18.9 LV IVS:        2.10 cm     LV e' lateral:   7.94 cm/s LVOT diam:     2.40 cm     LV E/e' lateral: 13.2 LV SV:         84 LV SV Index:   40 LVOT Area:     4.52 cm  LV Volumes (MOD) LV vol d, MOD A2C: 55.5 ml LV vol d, MOD A4C: 47.4 ml LV vol s, MOD A2C: 23.3 ml LV vol s, MOD A4C: 23.0 ml LV SV MOD A2C:     32.2 ml LV SV MOD A4C:     47.4 ml LV SV MOD BP:      29.3 ml RIGHT VENTRICLE RV Basal diam:  3.80 cm RV Mid diam:    1.90 cm TAPSE (M-mode): 1.6 cm LEFT ATRIUM             Index        RIGHT ATRIUM           Index LA diam:        4.00 cm 1.92 cm/m   RA Area:     28.30 cm LA Vol (A2C):   94.5 ml 45.25 ml/m  RA Volume:   105.00 ml 50.27 ml/m LA Vol (A4C):   80.3 ml 38.45 ml/m LA Biplane Vol: 90.2 ml 43.19 ml/m  AORTIC VALVE                     PULMONIC VALVE AV Area (Vmax):    3.85 cm      PV Vmax:       1.05 m/s AV Area (Vmean):   3.86 cm      PV Peak grad:  4.4 mmHg AV Area (VTI):     3.57 cm AV Vmax:           140.00 cm/s AV Vmean:          109.000 cm/s AV VTI:            0.236 m AV Peak Grad:      7.8 mmHg AV Mean Grad:      5.0 mmHg LVOT Vmax:         119.00 cm/s LVOT Vmean:        93.000 cm/s LVOT VTI:          0.186 m LVOT/AV VTI ratio: 0.79  AORTA Ao Root diam: 3.50  cm Ao Asc diam:  3.50 cm MITRAL VALVE                TRICUSPID VALVE MV Area (PHT): 10.84 cm    TR Peak grad:   36.5 mmHg MV Decel Time: 70 msec      TR Vmax:        302.00 cm/s MV E velocity: 105.00 cm/s                             SHUNTS                             Systemic VTI:  0.19 m                              Systemic Diam: 2.40 cm Dani Gobble Croitoru MD Electronically signed by Sanda Klein MD Signature Date/Time: 09/24/2021/1:01:06 PM    Final     Impression: Hematochezia  Hgb stable at 13.7 at 3 AM this morning prior to rectal bleeding.  Possible diverticular bleeding given rapid onset of bright red blood.  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  Patient at higher risk for GI bleed with chronic anticoagulation with Eliquis.  Eliquis has been hold.  Last dose this morning.  At this time patient has no further episodes of rectal bleeding.  We will continue to monitor.  No melena.  BUN normal.  Unlikely upper GI bleed.  Plan: Continue supportive care Patient continues large volume hematochezia consider CTA for further evaluation of bleeding source. Continue to monitor hemoglobin we will transfuse if less than 7.  Continue to hold Eliquis. [facility] will follow.  LOS: 1 day   Charlott Rakes  PA-C 09/25/2021, 11:03 AM  Contact #  [phone]

## 2021-09-25 NOTE — Progress Notes (Addendum)
eLink Physician-Brief Progress Note Patient Name: [name] DOB: [birth year] MRN: [id number]   Date of Service  09/25/2021  HPI/Events of Note  Large episode of hematochezia with hypotension to 80s Known AVM. GI following  eICU Interventions  Transfuse emergent PRBC x 2 Peripheral levophed ordered   10:57 PM ABG reviewed. Increase RR 24  09/26/21 @ 6:00 AM: Post-transfusion CBC 13. Remains on levophed 6. ?sedation related     Leemon Ayala Rodman Pickle 09/25/2021, 9:58 PM

## 2021-09-25 NOTE — Evaluation (Signed)
Occupational Therapy Evaluation Patient Details Name: [name] MRN: [id number] DOB: 1946-03-20 Today's Date: 09/25/2021   History of Present Illness Patient is a 76 year old male who was diagnosed with UTI on 5/17 at the [place]. patient was noted to have O2 in 80s on RA in ED. patient was found to have acute hypoxic respiratory failure, acute on chronic HFpEF, a fib with RVR, acute encephalopathy, UTI and acute urinary retention.LOV:FIEPPI fibrillation, interstitial lung disease, and small cell lung cancer   Clinical Impression   Patient is a 76 year old male who was admitted for above. Patient was living at home with wife and using no AD for ADLs prior level. Patient  was noted to have decreased functional activity tolerance, decreased endurance, decreased standing balance and decreased safety awareness impacting participation in ADLs. Patient would continue to benefit from skilled OT services at this time while admitted and after d/c to address noted deficits in order to improve overall safety and independence in ADLs.       Recommendations for follow up therapy are one component of a multi-disciplinary discharge planning process, led by the attending physician.  Recommendations may be updated based on patient status, additional functional criteria and insurance authorization.   Follow Up Recommendations  Home health OT    Assistance Recommended at Discharge Frequent or constant Supervision/Assistance  Patient can return home with the following A little help with walking and/or transfers;A little help with bathing/dressing/bathroom;Assistance with cooking/housework;Direct supervision/assist for financial management;Assist for transportation;Help with stairs or ramp for entrance;Direct supervision/assist for medications management    Functional Status Assessment  Patient has had a recent decline in their functional status and demonstrates the ability to make significant improvements in  function in a reasonable and predictable amount of time.  Equipment Recommendations       Recommendations for Other Services       Precautions / Restrictions Precautions Precaution Comments: monitor O2 and HR Restrictions Weight Bearing Restrictions: No      Mobility Bed Mobility Overal bed mobility: Needs Assistance Bed Mobility: Supine to Sit     Supine to sit: Min assist, HOB elevated     General bed mobility comments: with increased time and line management    Transfers                          Balance Overall balance assessment: Needs assistance Sitting-balance support: No upper extremity supported, Feet supported Sitting balance-Leahy Scale: Fair     Standing balance support: Bilateral upper extremity supported, During functional activity, Reliant on assistive device for balance Standing balance-Leahy Scale: Poor                             ADL either performed or assessed with clinical judgement   ADL Overall ADL's : Needs assistance/impaired Eating/Feeding: Supervision/ safety;Sitting Eating/Feeding Details (indicate cue type and reason): in recliner Grooming: Set up;Sitting   Upper Body Bathing: Minimal assistance;Sitting   Lower Body Bathing: Moderate assistance;Sit to/from stand   Upper Body Dressing : Minimal assistance;Sitting   Lower Body Dressing: Moderate assistance;Sit to/from stand   Toilet Transfer: Minimal assistance;+2 for safety/equipment Toilet Transfer Details (indicate cue type and reason): with RW from bed to recliner in room with increased time Toileting- Clothing Manipulation and Hygiene: Maximal assistance;Sit to/from stand               Vision Patient Visual Report: No change  from baseline       Perception     Praxis      Pertinent Vitals/Pain Pain Assessment Pain Assessment: No/denies pain     Hand Dominance Right   Extremity/Trunk Assessment Upper Extremity Assessment Upper Extremity  Assessment: RUE deficits/detail;LUE deficits/detail RUE Deficits / Details: patients ROM was [facility] but strength was 4-/5 for tasks. patient was able to complete [facility] to open meal items.   Lower Extremity Assessment Lower Extremity Assessment: Defer to PT evaluation   Cervical / Trunk Assessment Cervical / Trunk Assessment: Normal   Communication Communication Communication: No difficulties   Cognition Arousal/Alertness: Awake/alert                                           General Comments       Exercises     Shoulder Instructions      Home Living Family/patient expects to be discharged to:: Private residence Living Arrangements: Spouse/significant other Available Help at Discharge: Family;Available 24 hours/day Type of Home: House Home Access: Level entry     Home Layout: One level     Bathroom Shower/Tub: Walk-in shower                    Prior Functioning/Environment Prior Level of Function : Independent/Modified Independent                        OT Problem List: Decreased strength;Decreased activity tolerance;Impaired balance (sitting and/or standing);Decreased safety awareness;Cardiopulmonary status limiting activity;Decreased knowledge of precautions;Decreased knowledge of use of DME or AE      OT Treatment/Interventions: Self-care/ADL training;Therapeutic exercise;Neuromuscular education;DME and/or AE instruction;Energy conservation;Therapeutic activities;Balance training;Patient/family education    OT Goals(Current goals can be found in the care plan section) Acute Rehab OT Goals Patient Stated Goal: to get better OT Goal Formulation: With patient/family Time For Goal Achievement: 10/09/21 Potential to Achieve Goals: Fair  OT Frequency: Min 2X/week    Co-evaluation PT/OT/SLP Co-Evaluation/Treatment: Yes Reason for Co-Treatment: For patient/therapist safety;To address functional/ADL transfers PT goals addressed during  session: Mobility/safety with mobility OT goals addressed during session: ADL's and self-care      AM-PAC OT "6 Clicks" Daily Activity     Outcome Measure Help from another person eating meals?: None Help from another person taking care of personal grooming?: A Little Help from another person toileting, which includes using toliet, bedpan, or urinal?: A Lot Help from another person bathing (including washing, rinsing, drying)?: A Lot Help from another person to put on and taking off regular upper body clothing?: A Little Help from another person to put on and taking off regular lower body clothing?: A Lot 6 Click Score: 16   End of Session Equipment Utilized During Treatment: Gait belt;Rolling walker (2 wheels) Nurse Communication: Mobility status  Activity Tolerance: Patient tolerated treatment well Patient left: in chair;with call bell/phone within reach;with chair alarm set  OT Visit Diagnosis: Unsteadiness on feet (R26.81);Other abnormalities of gait and mobility (R26.89);Muscle weakness (generalized) (M62.81)                Time: 3329-5188 OT Time Calculation (min): 20 min Charges:  OT General Charges $OT Visit: 1 Visit OT Evaluation $OT Eval Moderate Complexity: 1 Mod  Jackelyn Poling OTR/L, MS Acute Rehabilitation Department Office# [phone] Pager# [phone]   Marcellina Millin 09/25/2021, 10:55 AM

## 2021-09-25 NOTE — Consult Note (Signed)
NAME:  [name], MRN:  [id number], DOB:  1945-08-10, LOS: 1 ADMISSION DATE:  09/23/2021, CONSULTATION DATE: 09/25/2021 REFERRING MD: Dr. Reesa Chew, CHIEF COMPLAINT: Respiratory failure  History of Present Illness:  Patient with a history of interstitial lung disease on Ofev, history of atrial fibrillation on Eliquis has been GI bleed History of small cell lung cancer -On chemotherapy -4 sessions of chemo at [facility]  Fatigue and shortness of breath of a few days duration Recent antibiotic therapy for UTI  History of acute on chronic heart failure, preserved ejection fraction-was being diuresed He was having A-fib with RVR, started on Cardizem drip, switch to Cardizem p.o.  Noted to be having maroon stool today Went for CT angio abdomen Became encephalopathic, acute on chronic respiratory failure  Diagnosis of lung cancer was made in 11/18/2020 at [facility] with an EBUS showing metastatic small cell lung cancer   Pertinent  Medical History   Past Medical History:  Diagnosis Date   Arthritis    Cancer [facility])    Coronary artery calcification seen on CAT scan    Dysrhythmia    History of kidney stones    PAF (paroxysmal atrial fibrillation) ([place]) 08/02/2014   PTSD (post-traumatic stress disorder)    controlled   Active smoker Significant Hospital Events: Including procedures, antibiotic start and stop dates in addition to other pertinent events    CT chestIMPRESSION: 1. No pulmonary embolus. 2. Moderate right and small left pleural effusions, new from prior exam. Ground-glass opacities throughout the right upper and lower lobes and to a lesser extent left upper lobe, favored to represent pulmonary edema in this setting. Possibility of atypical infection is also considered, lymphangitic spread of tumor is felt less likely. There is generalized body wall edema. 3. Right lower lobe nodule with fiducial markers has increased in size from prior exam. There are additional new  pulmonary nodules in the right lower and right middle lobes, concerning for metastatic disease. Recommend continued oncologic follow-up. 4. Nonspecific right hilar adenopathy. 5. 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    CT Angio abdomen IMPRESSION: VASCULAR   Aortic atherosclerosis. No significant vascular disease. No site of gastrointestinal bleeding is identified.   NON-VASCULAR   1. Mass in the right lower lung is probably neoplastic. Moderate right pleural effusion with basilar atelectasis. Interstitial and airspace infiltrates may indicate edema or multifocal pneumonia. 2. Enlarging multiple lesions in the liver likely representing metastatic disease. 3. Diffuse bone metastasis. 4. Small left inguinal hernia containing fat. 5. Cholelithiasis. 6. Nonobstructing renal stones bilaterally.  Recent CT abdomen and pelvis 06/20/2021 from care everywhere did reveal interval decrease in right lower lobe lung mass, multiple hepatic lesions, multiple sclerotic osseous deposits favoring metastasis Interim History / Subjective:  Became obtunded, increased O2 requirement, increased tachycardia  Objective   Blood pressure 99/71, pulse (!) 135, temperature 97.9 F (36.6 C), temperature source Axillary, resp. rate 20, height 6\' 2"  (1.88 m), weight 77.2 kg, SpO2 100 %.    Vent Mode: PRVC FiO2 (%):  [80 %] 80 % Set Rate:  [20 bmp] 20 bmp Vt Set:  [490 mL] 490 mL PEEP:  [5 cmH20] 5 cmH20 Plateau Pressure:  [21 cmH20] 21 cmH20   Intake/Output Summary (Last 24 hours) at 09/25/2021 1958 Last data filed at 09/25/2021 1842 Gross per 24 hour  Intake 1572.82 ml  Output 4375 ml  Net -2802.18 ml   Filed Weights   09/24/21 0410 09/25/21 0500 09/25/21 0600  Weight: 82.6 kg 77.2 kg 77.2 kg    Examination: General: Elderly, comfortable on vent HENT: Moist oral mucosa, size 7.5 endotracheal tube in place Lungs: Is  bilaterally Cardiovascular: S1-S2 appreciated Abdomen: Soft, bowel sounds appreciated Extremities: No clubbing, no edema Neuro: Sedated GU: Fair output  Pulmonary function test 07/15/2020 with mild restrictive lung disease, mild reduction in diffusing capacity  Echocardiogram 09/24/2021 with mild pulmonary hypertension, diastolic dysfunction  Resolved Hospital Problem list     Assessment & Plan:  Acute on chronic heart failure Preserved ejection fraction -Continue diuresis -Pleural effusion may be secondary to decompensated heart failure    Acute hypoxemic respiratory failure -Secondary to decompensated heart failure -Secondary to blood loss anemia -Secondary to underlying interstitial lung disease  Lower GI bleed -Known to have AVM -Last colonoscopy was more than 5 years ago -GI on board -Anticoagulation on hold  -GI already saw patient for hematochezia -Possible endoscopy in a.m. if clinically stable  Atrial fibrillation with rapid ventricular response -On Cardizem  Acute encephalopathy Became obtunded this afternoon -Multifactorial  Urinary tract infection -Was on Macrobid -Will complete Rocephin in a.m. for 7 days  Small cell lung cancer -On carboplatin and etoposide -Being followed with surveillance imaging as it did have significant side effects from chemo -There is increase in the right lower lobe nodule new right middle lobe and right lower lobe nodules Last chemo dose was 06/22/2021 and according to spouse-his cancer was controlled -Enlarging right lower lobe mass lesion, associated pleural effusion, multiple hepatic metastases  Interstitial lung disease -Was started on Ofev  Mild pulmonary hypertension likely secondary to interstitial lung disease  Best Practice (right click and "Reselect all SmartList Selections" daily)   Diet/type: NPO DVT prophylaxis: not indicated GI prophylaxis: PPI Lines: N/A Foley:  N/A Code Status:  full code Last date  of multidisciplinary goals of care discussion [pending, spoke with patient's spouse and sister at bedside following intubation]  Labs   CBC: Recent Labs  Lab 09/23/21 2305 09/24/21 0524 09/25/21 0304 09/25/21 1645 09/25/21 1818  WBC 10.2 8.9 8.8 10.1 9.2  HGB 14.2 13.1 13.7 11.7* 10.2*  HCT 43.9 41.4 42.6 36.5* 32.7*  MCV 79.7* 79.8* 79.8* 80.8 80.5  PLT 184 168 165 184 700    Basic Metabolic Panel: Recent Labs  Lab 09/23/21 2305 09/24/21 0524 09/25/21 0304 09/25/21 1818  NA 129* 133* 132* 129*  K 3.8 3.2* 3.4* 4.5  CL 96* 98 96* 95*  CO2 25 24 25 26   GLUCOSE 140* 133* 122* 207*  BUN 17 16 15 19   CREATININE 0.53* 0.38* 0.50* 0.55*  CALCIUM 8.2* 8.2* 8.2* 7.3*  MG  --   --  1.8  --   PHOS  --   --  <1.0*  --    GFR: Estimated Creatinine Clearance: 87.1 mL/min (A) (by C-G formula based on SCr of 0.55 mg/dL (L)). Recent Labs  Lab 09/24/21 0524 09/25/21 0304 09/25/21 1645 09/25/21 1818  WBC 8.9 8.8 10.1 9.2    Liver Function Tests: No results for input(s): AST, ALT, ALKPHOS, BILITOT, PROT, ALBUMIN in the last 168 hours. No results for input(s): LIPASE, AMYLASE in the last 168 hours. No results for input(s): AMMONIA in the last 168 hours.  ABG    Component Value Date/Time   PHART 7.38 09/25/2021 1850   PCO2ART 58 (H) 09/25/2021 1850   PO2ART 108 09/25/2021 1850   HCO3 34.1 (H) 09/25/2021 1850   O2SAT 98.2 09/25/2021 1850     Coagulation Profile: Recent  Labs  Lab 09/25/21 1818  INR 1.8*    Cardiac Enzymes: No results for input(s): CKTOTAL, CKMB, CKMBINDEX, TROPONINI in the last 168 hours.  HbA1C: No results found for: HGBA1C  CBG: No results for input(s): GLUCAP in the last 168 hours.  Review of Systems:   Unable to provide history  Past Medical History:  He,  has a past medical history of Arthritis, Cancer ([place]), Coronary artery calcification seen on CAT scan, Dysrhythmia, History of kidney stones, PAF (paroxysmal atrial fibrillation) ([place])  (08/02/2014), and PTSD (post-traumatic stress disorder).   Surgical History:   Past Surgical History:  Procedure Laterality Date   BACK SURGERY     2000   BRONCHIAL BIOPSY  08/08/2020   Procedure: BRONCHIAL BIOPSIES;  Surgeon: Collene Gobble, MD;  Location: [place];  Service: Cardiopulmonary;;   BRONCHIAL BRUSHINGS  08/08/2020   Procedure: BRONCHIAL BRUSHINGS;  Surgeon: Collene Gobble, MD;  Location: [place];  Service: Cardiopulmonary;;   BRONCHIAL NEEDLE ASPIRATION BIOPSY  08/08/2020   Procedure: BRONCHIAL NEEDLE ASPIRATION BIOPSIES;  Surgeon: Collene Gobble, MD;  Location: [place] ENDOSCOPY;  Service: Cardiopulmonary;;   CARPAL TUNNEL RELEASE Right    FIDUCIAL MARKER PLACEMENT  08/08/2020   Procedure: FIDUCIAL MARKER PLACEMENT;  Surgeon: Collene Gobble, MD;  [facility] ENDOSCOPY;  Service: Cardiopulmonary;;   JOINT REPLACEMENT      left 2005   SHOULDER ARTHROSCOPY W/ ROTATOR CUFF REPAIR  2012   right   TOTAL KNEE ARTHROPLASTY  08/28/2011   Procedure: TOTAL KNEE ARTHROPLASTY;  Surgeon: Garald Balding, MD;  Location: [place];  Service: Orthopedics;  Laterality: Right;   VIDEO BRONCHOSCOPY WITH ENDOBRONCHIAL NAVIGATION  08/08/2020   Procedure: VIDEO BRONCHOSCOPY WITH ENDOBRONCHIAL NAVIGATION;  Surgeon: Collene Gobble, MD;  Location: [facility] ENDOSCOPY;  Service: Cardiopulmonary;;     Social History:   reports that he quit smoking about 10 months ago. His smoking use included cigarettes. He started smoking about 41 years ago. He has a 20.00 pack-year smoking history. He has never used smokeless tobacco. He reports that he does not drink alcohol and does not use drugs.   Family History:  His family history includes Alcoholism in his brother; HIV in his brother; Heart attack in his maternal grandfather; Kidney cancer in his father; Lung cancer in his mother. There is no history of Anesthesia problems, Hypotension, Malignant hyperthermia, or Pseudochol deficiency.   Allergies No Known  Allergies   Home Medications  Prior to Admission medications   Medication Sig Start Date End Date Taking? Authorizing Provider  apixaban (ELIQUIS) 5 MG TABS tablet Take 5 mg by mouth 2 (two) times daily.   Yes [provider]  Brompheniramine-Pseudoeph (BROMALINE PO) Take 2 capsules by mouth daily.   Yes [provider]  GLUTATHIONE PO Take 5 mLs by mouth daily.   Yes [provider]  Nintedanib (OFEV) 150 MG CAPS Take 1 capsule (150 mg total) by mouth 2 (two) times daily. Patient taking differently: Take 150 mg by mouth daily. 05/26/21  Yes Mannam, Praveen, MD  OVER THE COUNTER MEDICATION Take 2 capsules by mouth daily. Probiotic + Prebiotic + Postbiotic Combination. Ageless Biome by [employer].   Yes [provider]  OVER THE COUNTER MEDICATION Apply 5 drops topically 2 (two) times daily. CBD+ Blackseed Oil Combination   Yes [provider]  OVER THE COUNTER MEDICATION Take 2 capsules by mouth daily. Fuceidan   Yes [provider]  phosphorus (K PHOS NEUTRAL) [phone] MG tablet Take  2 tablets by mouth in the morning and at bedtime. 05/03/21  Yes [provider]  vitamin C (ASCORBIC ACID) 500 MG tablet Take 500 mg by mouth daily.   Yes [provider]  Zinc 50 MG TABS Take 50 mg by mouth daily.   Yes [provider]   The patient is critically ill with multiple organ systems failure and requires high complexity decision making for assessment and support, frequent evaluation and titration of therapies, application of advanced monitoring technologies and extensive interpretation of multiple databases. Critical Care Time devoted to patient care services described in this note independent of APP/resident time (if applicable)  is 45 minutes.   Sherrilyn Rist MD [facility] Personal pager: See Amion If unanswered, please page CCM On-call: [phone]

## 2021-09-25 NOTE — Progress Notes (Signed)
An USGPIV (ultrasound guided PIV) has been placed for short-term vasopressor infusion. A correctly placed ivWatch must be used when administering Vasopressors. Should this treatment be needed beyond 72 hours, central line access should be obtained.  It will be the responsibility of the bedside nurse to follow best practice to prevent extravasations.   ?

## 2021-09-25 NOTE — Progress Notes (Signed)
PROGRESS NOTE    [name]  DGL:[id number] DOB: 1946-04-15 DOA: 09/23/2021 PCP: [facility]   Brief Narrative:  76 year old with history of A-fib on Eliquis, interstitial lung disease, small cell lung cancer came to the ED with fatigue, shortness of breath, confusion and hypoxia.  Patient was recently seen at the [place] on 5/17 and was diagnosed with UTI and started on Macrobid.  Patient continues to have confusion, hypoxia and fatigue.  Upon admission CT showed concerns of right lower and middle lobe metastatic disease, pleural effusions and possible pneumonia.  He was also noted to be in atrial fibrillation with RVR requiring Cardizem drip.  He has slowly been transitioned to oral Cardizem.   Assessment & Plan:  Principal Problem:   Acute respiratory failure with hypoxia (HCC) Active Problems:   ILD (interstitial lung disease) ([place])   Small cell lung cancer (HCC)   Atrial fibrillation with RVR (HCC)   Hyponatremia   Acute on chronic heart failure with preserved ejection fraction (HFpEF) (HCC)   Acute UTI   Acute encephalopathy   Acute urinary retention   Malignant neoplasm of lung (HCC)   Pressure injury of skin    Acute hypoxic respiratory failure; acute on chronic HFpEF, class IIII Bilateral pleural effusion nitial signs of hypoxia.  This is combination of progression of metastatic disease, possible underlying pneumonia and pleural effusion.  Wean off oxygen as appropriate.  Echocardiogram in November 2022 showed preserved ejection fraction. We will transition to Lasix daily Repeat echo-EF 60%, moderate LVH, mildly reduced RV function, dilated cardiomyopathy, mild to moderate TR May require thoracentesis if diuretics alone are not helping his symptoms.  Lower GI bleed - Multifactorial-AVM, diverticular, hemorrhoids.  Doubt upper GI bleed, BUN normal - His last colonoscopy was at the [place] more than 5 years ago.  Hemoglobin currently stable but due to large volume,  consulted GI-Dr. Watt Climes from [place].  We will hold Eliquis.   Atrial fibrillation with RVR, improved -We will transition him to oral Cardizem.  Eliquis currently on hold   Acute encephalopathy, resolved Multifactorial.  Recently diagnosed with outpatient UTI on Macrobid but currently on IV Rocephin.  If necessary will obtain MRI brain   UTI; POA -Was on outpatient Macrobid, currently on IV Rocephin.  Follow-up culture data.   Acute urinary retention  -Likely causing UTI.  Required multiple in and out catheterization, now Foley catheter in place   Small cell lung cancer  - Managed by Dr. Mindi Junker at [facility] where he received carbo/etop, had significant side effects, and now followed with surveillance CT imaging  - CT in ED concerning for interval increase in size of RLL nodule and new RML and RLL nodules     ILD  - Continue Ofev    Hyponatremia/hypokalemia, hypophosphatemia - Aggressive repletion     Large coccygeal and buttock ulcer noted bilaterally.  Present prior to admission.  Wound care consulted.   DVT prophylaxis: Eliquis, now on hold due to lower GI bleed Code Status: Full Code Family Communication: Family is at bedside Status is: Inpatient Remains inpatient appropriate because: Maintain hospital stay as patient is now having lower GI bleed   Subjective: Patient had a large bloody bowel movement about 800 cc of blood this morning per the nursing staff with some solid stool.  Denies any complaints otherwise.   Examination:  Constitutional: Not in acute distress Respiratory: Clear to auscultation bilaterally Cardiovascular: Irregularly irregular, no rubs Abdomen: Nontender nondistended good bowel sounds Musculoskeletal: No edema  noted Skin: No rashes seen Neurologic: CN 2-12 grossly intact.  And nonfocal Psychiatric: Normal judgment and insight. Alert and oriented x 3. Normal mood.   Objective: Vitals:   09/25/21 0400 09/25/21 0500 09/25/21 0600  09/25/21 0637  BP: 118/80 (!) 142/93 (!) 139/93   Pulse: 88 99 84   Resp: 18 (!) 21 (!) 22   Temp: 98.3 F (36.8 C)   98.3 F (36.8 C)  TempSrc: Oral   Oral  SpO2: 96% 97% 98%   Weight:  77.2 kg 77.2 kg   Height:        Intake/Output Summary (Last 24 hours) at 09/25/2021 0715 Last data filed at 09/25/2021 0600 Gross per 24 hour  Intake 758.4 ml  Output 5525 ml  Net -4766.6 ml   Filed Weights   09/24/21 0410 09/25/21 0500 09/25/21 0600  Weight: 82.6 kg 77.2 kg 77.2 kg     Data Reviewed:   CBC: Recent Labs  Lab 09/23/21 2305 09/24/21 0524 09/25/21 0304  WBC 10.2 8.9 8.8  HGB 14.2 13.1 13.7  HCT 43.9 41.4 42.6  MCV 79.7* 79.8* 79.8*  PLT 184 168 638   Basic Metabolic Panel: Recent Labs  Lab 09/23/21 2305 09/24/21 0524 09/25/21 0304  NA 129* 133* 132*  K 3.8 3.2* 3.4*  CL 96* 98 96*  CO2 25 24 25   GLUCOSE 140* 133* 122*  BUN 17 16 15   CREATININE 0.53* 0.38* 0.50*  CALCIUM 8.2* 8.2* 8.2*  MG  --   --  1.8  PHOS  --   --  <1.0*   GFR: Estimated Creatinine Clearance: 87.1 mL/min (A) (by C-G formula based on SCr of 0.5 mg/dL (L)). Liver Function Tests: No results for input(s): AST, ALT, ALKPHOS, BILITOT, PROT, ALBUMIN in the last 168 hours. No results for input(s): LIPASE, AMYLASE in the last 168 hours. No results for input(s): AMMONIA in the last 168 hours. Coagulation Profile: No results for input(s): INR, PROTIME in the last 168 hours. Cardiac Enzymes: No results for input(s): CKTOTAL, CKMB, CKMBINDEX, TROPONINI in the last 168 hours. BNP (last 3 results) No results for input(s): PROBNP in the last 8760 hours. HbA1C: No results for input(s): HGBA1C in the last 72 hours. CBG: No results for input(s): GLUCAP in the last 168 hours. Lipid Profile: No results for input(s): CHOL, HDL, LDLCALC, TRIG, CHOLHDL, LDLDIRECT in the last 72 hours. Thyroid Function Tests: No results for input(s): TSH, T4TOTAL, FREET4, T3FREE, THYROIDAB in the last 72  hours. Anemia Panel: No results for input(s): VITAMINB12, FOLATE, FERRITIN, TIBC, IRON, RETICCTPCT in the last 72 hours. Sepsis Labs: No results for input(s): PROCALCITON, LATICACIDVEN in the last 168 hours.  Recent Results (from the past 240 hour(s))  Resp Panel by RT-PCR (Flu A&B, Covid) Nasopharyngeal Swab     Status: None   Collection Time: 09/23/21 11:18 PM   Specimen: Nasopharyngeal Swab; Nasopharyngeal(NP) swabs in vial transport medium  Result Value Ref Range Status   SARS Coronavirus 2 by RT PCR NEGATIVE NEGATIVE Final    Comment: (NOTE) SARS-CoV-2 target nucleic acids are NOT DETECTED.  The SARS-CoV-2 RNA is generally detectable in upper respiratory specimens during the acute phase of infection. The lowest concentration of SARS-CoV-2 viral copies this assay can detect is 138 copies/mL. A negative result does not preclude SARS-Cov-2 infection and should not be used as the sole basis for treatment or other patient management decisions. A negative result may occur with  improper specimen collection/handling, submission of specimen other than nasopharyngeal swab,  presence of viral mutation(s) within the areas targeted by this assay, and inadequate number of viral copies(<138 copies/mL). A negative result must be combined with clinical observations, patient history, and epidemiological information. The expected result is Negative.  Fact Sheet for Patients:  EntrepreneurPulse.com.au  Fact Sheet for Healthcare Providers:  IncredibleEmployment.be  This test is no t yet approved or cleared by the [place] FDA and  has been authorized for detection and/or diagnosis of SARS-CoV-2 by FDA under an Emergency Use Authorization (EUA). This EUA will remain  in effect (meaning this test can be used) for the duration of the COVID-19 declaration under Section 564(b)(1) of the Act, 21 U.S.C.section 360bbb-3(b)(1), unless the authorization is  terminated  or revoked sooner.       Influenza A by PCR NEGATIVE NEGATIVE Final   Influenza B by PCR NEGATIVE NEGATIVE Final    Comment: (NOTE) The Xpert Xpress SARS-CoV-2/FLU/RSV plus assay is intended as an aid in the diagnosis of influenza from Nasopharyngeal swab specimens and should not be used as a sole basis for treatment. Nasal washings and aspirates are unacceptable for Xpert Xpress SARS-CoV-2/FLU/RSV testing.  Fact Sheet for Patients: EntrepreneurPulse.com.au  Fact Sheet for Healthcare Providers: IncredibleEmployment.be  This test is not yet approved or cleared by the [place] FDA and has been authorized for detection and/or diagnosis of SARS-CoV-2 by FDA under an Emergency Use Authorization (EUA). This EUA will remain in effect (meaning this test can be used) for the duration of the COVID-19 declaration under Section 564(b)(1) of the Act, 21 U.S.C. section 360bbb-3(b)(1), unless the authorization is terminated or revoked.  Performed at [facility], [address] [place], [place] [address]   MRSA Next Gen by PCR, Nasal     Status: None   Collection Time: 09/24/21  3:26 AM   Specimen: Nasal Mucosa; Nasal Swab  Result Value Ref Range Status   MRSA by PCR Next Gen NOT DETECTED NOT DETECTED Final    Comment: (NOTE) The GeneXpert MRSA Assay (FDA approved for NASAL specimens only), is one component of a comprehensive MRSA colonization surveillance program. It is not intended to diagnose MRSA infection nor to guide or monitor treatment for MRSA infections. Test performance is not FDA approved in patients less than 49 years old. Performed at [facility], [place] [address], [place], [place] [address]          Radiology Studies: DG Chest 2 View  Result Date: 09/23/2021 CLINICAL DATA:  Shortness of breath. Low oxygen saturation. History of lung cancer and pulmonary fibrosis. EXAM: CHEST - 2  VIEW COMPARISON:  Radiograph 08/18/2020, CT 07/14/2020 FINDINGS: Chronic cardiomegaly. Fiducial markers at the right lung base. There are fine interstitial opacities are increased from prior exam, particularly in the right upper lobe. Peripheral subpleural opacity consistent with interstitial lung disease. Possible small right pleural effusion. No pneumothorax. IMPRESSION: Increased fine interstitial opacities, particularly in the right upper lobe, may be pulmonary edema, and progression in pulmonary fibrosis or atypical infection. Possible small right pleural effusion. Electronically Signed   By: Keith Rake M.D.   On: 09/23/2021 23:03   CT Angio Chest PE W and/or Wo Contrast  Result Date: 09/24/2021 CLINICAL DATA:  Lung/mediastinal abscess Shortness of breath.  History of lung cancer and pulmonary fibrosis. EXAM: CT ANGIOGRAPHY CHEST WITH CONTRAST TECHNIQUE: Multidetector CT imaging of the chest was performed using the standard protocol during bolus administration of intravenous contrast. Multiplanar CT image reconstructions and MIPs were obtained to evaluate  the vascular anatomy. RADIATION DOSE REDUCTION: This exam was performed according to the departmental dose-optimization program which includes automated exposure control, adjustment of the mA and/or kV according to patient size and/or use of iterative reconstruction technique. CONTRAST:  36mL OMNIPAQUE IOHEXOL 350 MG/ML SOLN COMPARISON:  Radiograph yesterday. Chest CT 06/20/2021 at [facility], images available but no report. FINDINGS: Cardiovascular: There are no filling defects within the pulmonary arteries to suggest pulmonary embolus. No aortic dissection or acute aortic findings. There is mild cardiomegaly. Mild contrast refluxing into the IVC. Trace pericardial effusion. Mediastinum/Nodes: Nonspecific right hilar adenopathy including a 19 mm infrahilar node series 5, image 61. There additional prominent and mildly enlarged right hilar  lymph nodes. Scattered small mediastinal lymph nodes are not enlarged by size criteria. No esophageal wall thickening. Lungs/Pleura: Moderate right and small left pleural effusion, new from prior. There is peripheral subpleural reticulation consistent with underlying interstitial lung disease, chronic. New from prior exam diffuse ground-glass opacity throughout the right upper and lower lobes. There is also mild ground-glass opacity in the perifissural and anterior left upper lobe. There are multiple small nodules in the right lower lobe which appear to be new from prior, for example series 6, image 69 in 63. Peripheral right lower lobe nodule with fiducial markers has increased in size currently 3.2 x 3.2 cm, previously 2.6 x 1.4 cm. Right middle lobe 8 mm nodule series 6, image 66, new. There is retained mucus within the right aspect of the trachea. Upper Abdomen: Contrast refluxing into the IVC. Nonobstructing left renal calculi. Musculoskeletal: There is marked body wall edema. 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 These are grossly stable. No vertebral body compression deformity or evidence of pathologic fracture. Review of the MIP images confirms the above findings. IMPRESSION: 1. No pulmonary embolus. 2. Moderate right and small left pleural effusions, new from prior exam. Ground-glass opacities throughout the right upper and lower lobes and to a lesser extent left upper lobe, favored to represent pulmonary edema in this setting. Possibility of atypical infection is also considered, lymphangitic spread of tumor is felt less likely. There is generalized body wall edema. 3. Right lower lobe nodule with fiducial markers has increased in size from prior exam. There are additional new pulmonary nodules in the right lower and right middle lobes, concerning for metastatic disease. Recommend continued oncologic follow-up. 4. Nonspecific right hilar  adenopathy. 5. 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 Electronically Signed   By: Keith Rake M.D.   On: 09/24/2021 00:53   ECHOCARDIOGRAM COMPLETE  Result Date: 09/24/2021    ECHOCARDIOGRAM REPORT   Patient Name:   [name] Date of Exam: 09/24/2021 Medical Rec #:  [id number]        Height:       74.0 in Accession #:    [id number]       Weight:       182.1 lb Date of Birth:  [birth year]        BSA:          2.089 m Patient Age:    40 years         BP:           147/70 mmHg Patient Gender: M                HR:           85 bpm. Exam Location:  Inpatient Procedure: 2D  Echo, Color Doppler and Cardiac Doppler Indications:    Congestive heart failure  History:        Patient has prior history of Echocardiogram examinations, most                 recent 08/21/2016. Arrythmias:Atrial Fibrillation.  Sonographer:    Joette Catching RCS Referring Phys: [id number] [facility]  1. Left ventricular ejection fraction, by estimation, is 60 to 65%. The left ventricle has normal function. The left ventricle has no regional wall motion abnormalities. There is moderate asymmetric left ventricular hypertrophy of the septal segment. Left ventricular diastolic function could not be evaluated.  2. Right ventricular systolic function is mildly reduced. The right ventricular size is normal. Mildly increased right ventricular wall thickness. There is mildly elevated pulmonary artery systolic pressure.  3. Left atrial size was moderately dilated.  4. Right atrial size was moderately dilated.  5. The mitral valve is normal in structure. Mild mitral valve regurgitation.  6. Tricuspid valve regurgitation is mild to moderate.  7. The aortic valve is tricuspid. Aortic valve regurgitation is not visualized. No aortic stenosis is present.  8. The inferior vena cava is normal in size with greater than 50% respiratory variability, suggesting right atrial  pressure of 3 mmHg. Comparison(s): Consider evaluation for infiltrative cardiomyopathy (e.g. amyloidosis). FINDINGS  Left Ventricle: Left ventricular ejection fraction, by estimation, is 60 to 65%. The left ventricle has normal function. The left ventricle has no regional wall motion abnormalities. The left ventricular internal cavity size was normal in size. There is  moderate asymmetric left ventricular hypertrophy of the septal segment. Left ventricular diastolic function could not be evaluated due to atrial fibrillation. Left ventricular diastolic function could not be evaluated. Right Ventricle: The right ventricular size is normal. Mildly increased right ventricular wall thickness. Right ventricular systolic function is mildly reduced. There is mildly elevated pulmonary artery systolic pressure. The tricuspid regurgitant velocity is 3.02 m/s, and with an assumed right atrial pressure of 3 mmHg, the estimated right ventricular systolic pressure is 50.3 mmHg. Left Atrium: Left atrial size was moderately dilated. Right Atrium: Right atrial size was moderately dilated. Pericardium: There is no evidence of pericardial effusion. Mitral Valve: The mitral valve is normal in structure. Mild mitral valve regurgitation, with centrally-directed jet. Tricuspid Valve: The tricuspid valve is normal in structure. Tricuspid valve regurgitation is mild to moderate. Aortic Valve: The aortic valve is tricuspid. Aortic valve regurgitation is not visualized. No aortic stenosis is present. Aortic valve mean gradient measures 5.0 mmHg. Aortic valve peak gradient measures 7.8 mmHg. Aortic valve area, by VTI measures 3.57 cm. Pulmonic Valve: The pulmonic valve was normal in structure. Pulmonic valve regurgitation is not visualized. Aorta: The aortic root and ascending aorta are structurally normal, with no evidence of dilitation. Venous: The inferior vena cava is normal in size with greater than 50% respiratory variability, suggesting  right atrial pressure of 3 mmHg. IAS/Shunts: No atrial level shunt detected by color flow Doppler.  LEFT VENTRICLE PLAX 2D LVIDd:         4.00 cm     Diastology LVIDs:         2.80 cm     LV e' medial:    5.55 cm/s LV PW:         1.60 cm     LV E/e' medial:  18.9 LV IVS:        2.10 cm     LV e' lateral:   7.94 cm/s  LVOT diam:     2.40 cm     LV E/e' lateral: 13.2 LV SV:         84 LV SV Index:   40 LVOT Area:     4.52 cm  LV Volumes (MOD) LV vol d, MOD A2C: 55.5 ml LV vol d, MOD A4C: 47.4 ml LV vol s, MOD A2C: 23.3 ml LV vol s, MOD A4C: 23.0 ml LV SV MOD A2C:     32.2 ml LV SV MOD A4C:     47.4 ml LV SV MOD BP:      29.3 ml RIGHT VENTRICLE RV Basal diam:  3.80 cm RV Mid diam:    1.90 cm TAPSE (M-mode): 1.6 cm LEFT ATRIUM             Index        RIGHT ATRIUM           Index LA diam:        4.00 cm 1.92 cm/m   RA Area:     28.30 cm LA Vol (A2C):   94.5 ml 45.25 ml/m  RA Volume:   105.00 ml 50.27 ml/m LA Vol (A4C):   80.3 ml 38.45 ml/m LA Biplane Vol: 90.2 ml 43.19 ml/m  AORTIC VALVE                     PULMONIC VALVE AV Area (Vmax):    3.85 cm      PV Vmax:       1.05 m/s AV Area (Vmean):   3.86 cm      PV Peak grad:  4.4 mmHg AV Area (VTI):     3.57 cm AV Vmax:           140.00 cm/s AV Vmean:          109.000 cm/s AV VTI:            0.236 m AV Peak Grad:      7.8 mmHg AV Mean Grad:      5.0 mmHg LVOT Vmax:         119.00 cm/s LVOT Vmean:        93.000 cm/s LVOT VTI:          0.186 m LVOT/AV VTI ratio: 0.79  AORTA Ao Root diam: 3.50 cm Ao Asc diam:  3.50 cm MITRAL VALVE                TRICUSPID VALVE MV Area (PHT): 10.84 cm    TR Peak grad:   36.5 mmHg MV Decel Time: 70 msec      TR Vmax:        302.00 cm/s MV E velocity: 105.00 cm/s                             SHUNTS                             Systemic VTI:  0.19 m                             Systemic Diam: 2.40 cm Dani Gobble Croitoru MD Electronically signed by Sanda Klein MD Signature Date/Time: 09/24/2021/1:01:06 PM    Final         Scheduled  Meds:  apixaban  5 mg Oral BID   Chlorhexidine Gluconate Cloth  6 each Topical Q0600   diltiazem  10 mg Intravenous Once   diltiazem  30 mg Oral Q6H   furosemide  40 mg Intravenous Q12H   mouth rinse  15 mL Mouth Rinse BID   Nintedanib  150 mg Oral BID   phosphorus  500 mg Oral BID   potassium chloride  20 mEq Oral Once   sodium chloride flush  3 mL Intravenous Q12H   Continuous Infusions:  cefTRIAXone (ROCEPHIN)  IV Stopped (09/25/21 0113)   diltiazem (CARDIZEM) infusion Stopped (09/24/21 1245)   magnesium sulfate bolus IVPB     potassium PHOSPHATE IVPB (in mmol) 85 mL/hr at 09/25/21 0600   potassium PHOSPHATE IVPB (in mmol)       LOS: 1 day   Time spent= 35 mins    Zacharia Sowles Arsenio Loader, MD [facility]  If 7PM-7AM, please contact night-coverage  09/25/2021, 7:15 AM

## 2021-09-25 NOTE — Progress Notes (Signed)
I was notified by RN that patient again started having large amount of bloody bowel movement around 5 PM.  GI had just seen him and ordered a CTA abdomen pelvis to rule out any active bleed. When I arrived at bedside patient has had about 3-4 episodes of large bloody bowel movement, heart rate was in 110s with stable blood pressure.  He was more obtunded and clear change in mental status from earlier. Stat CT abdomen pelvis was obtained which did not show any active bleeding, some pleural effusion and progression of liver mets.  Initially due to concerns of low blood pressure and tachycardia he was started on IV fluids but due to desaturations and abnormal breath sounds IV fluids was stopped.  ABG showed pH of 7.3 with PCO2 of 58. Case was discussed with Dr. Therisa Doyne from GI, patient received last dose of Eliquis yesterday evening therefore was given Kcentra. Case was also discussed with IR Dr Philmore Pali recommended monitoring him for now as there is no evidence of active bleeding.  Given his abnormal mentation and critical condition with active bleeding, critical care team was consulted.  Patient was intubated for airway protection and abnormal mentation.  Ammonia, TSH, lactate has been ordered.  Patient may benefit from CT head once hemodynamically stabilized.  Multiple family members at bedside and they have been updated.  Appreciate input from GI, interventional radiology and critical care providers.  Also appreciate care from the nursing staff. Patient has been signed out to the nighttime [facility] NP as well.   Patient will be under critical care team.  Gerlean Ren MD [facility]

## 2021-09-25 NOTE — Procedures (Signed)
Intubation Procedure Note  [name]  [id number]  [birth year]  Date:09/25/21  Time:7:55 PM   Provider Performing:Alayia Meggison A Thanvi Blincoe    Procedure: Intubation (04591)  Indication(s) Respiratory Failure  Consent Unable to obtain consent due to emergent nature of procedure.   Anesthesia Etomidate, Versed, Fentanyl, and Rocuronium 10 of etomidate, 2 oh Versed, 100 of fentanyl, 40 of rocuronium  Time Out Verified patient identification, verified procedure, site/side was marked, verified correct patient position, special equipment/implants available, medications/allergies/relevant history reviewed, required imaging and test results available.   Sterile Technique Usual hand hygeine, masks, and gloves were used   Procedure Description Patient positioned in bed supine.  Sedation given as noted above.  Patient was intubated with endotracheal tube using Glidescope.  View was Grade 1 full glottis .  Number of attempts was 1.  Colorimetric CO2 detector was consistent with tracheal placement.   Complications/Tolerance None; patient tolerated the procedure well. Chest X-ray is ordered to verify placement.  Hypotension post intubation, required Neo-Synephrine 800 mcg in total   EBL none   Specimen(s) None

## 2021-09-25 NOTE — Progress Notes (Signed)
[place] Progress Note Patient Name: [name] DOB: 05-Apr-1946 MRN: [id number]   Date of Service  09/25/2021  HPI/Events of Note  LA 2.4. No pressor requirement right now Recently assessed by night team with plan for diuresis for working diagnosis of heart failure  eICU Interventions  No changes to plan     Intervention Category Minor Interventions: Electrolytes abnormality - evaluation and management Evaluation Type: New Patient Evaluation  Betheny Suchecki Rodman Pickle 09/25/2021, 8:54 PM

## 2021-09-26 ENCOUNTER — Inpatient Hospital Stay (HOSPITAL_COMMUNITY): Payer: No Typology Code available for payment source

## 2021-09-26 DIAGNOSIS — I5033 Acute on chronic diastolic (congestive) heart failure: Secondary | ICD-10-CM | POA: Diagnosis not present

## 2021-09-26 DIAGNOSIS — Z515 Encounter for palliative care: Secondary | ICD-10-CM

## 2021-09-26 DIAGNOSIS — C3490 Malignant neoplasm of unspecified part of unspecified bronchus or lung: Secondary | ICD-10-CM | POA: Diagnosis not present

## 2021-09-26 DIAGNOSIS — J810 Acute pulmonary edema: Secondary | ICD-10-CM

## 2021-09-26 DIAGNOSIS — J9601 Acute respiratory failure with hypoxia: Secondary | ICD-10-CM | POA: Diagnosis not present

## 2021-09-26 DIAGNOSIS — G9340 Encephalopathy, unspecified: Secondary | ICD-10-CM | POA: Diagnosis not present

## 2021-09-26 LAB — BLOOD GAS, ARTERIAL
Acid-Base Excess: 7 mmol/L — ABNORMAL HIGH (ref 0.0–2.0)
Bicarbonate: 30.2 mmol/L — ABNORMAL HIGH (ref 20.0–28.0)
FIO2: 40 %
Mode: POSITIVE
O2 Saturation: 99.3 %
PEEP: 5 cmH2O
Patient temperature: 37
Pressure support: 5 cmH2O
pCO2 arterial: 37 mmHg (ref 32–48)
pH, Arterial: 7.52 — ABNORMAL HIGH (ref 7.35–7.45)
pO2, Arterial: 113 mmHg — ABNORMAL HIGH (ref 83–108)

## 2021-09-26 LAB — BPAM RBC
Blood Product Expiration Date: 202306072359
Blood Product Expiration Date: 202306162359
ISSUE DATE / TIME: 202305222158
ISSUE DATE / TIME: 202305222158
Unit Type and Rh: 9500
Unit Type and Rh: 9500

## 2021-09-26 LAB — CBC
HCT: 39.6 % (ref 39.0–52.0)
Hemoglobin: 13.1 g/dL (ref 13.0–17.0)
MCH: 27.1 pg (ref 26.0–34.0)
MCHC: 33.1 g/dL (ref 30.0–36.0)
MCV: 82 fL (ref 80.0–100.0)
Platelets: 181 10*3/uL (ref 150–400)
RBC: 4.83 MIL/uL (ref 4.22–5.81)
RDW: 18.8 % — ABNORMAL HIGH (ref 11.5–15.5)
WBC: 11.5 10*3/uL — ABNORMAL HIGH (ref 4.0–10.5)
nRBC: 0.7 % — ABNORMAL HIGH (ref 0.0–0.2)

## 2021-09-26 LAB — COMPREHENSIVE METABOLIC PANEL
ALT: 45 U/L — ABNORMAL HIGH (ref 0–44)
AST: 78 U/L — ABNORMAL HIGH (ref 15–41)
Albumin: 2.5 g/dL — ABNORMAL LOW (ref 3.5–5.0)
Alkaline Phosphatase: 255 U/L — ABNORMAL HIGH (ref 38–126)
Anion gap: 9 (ref 5–15)
BUN: 23 mg/dL (ref 8–23)
CO2: 26 mmol/L (ref 22–32)
Calcium: 7.8 mg/dL — ABNORMAL LOW (ref 8.9–10.3)
Chloride: 98 mmol/L (ref 98–111)
Creatinine, Ser: 0.5 mg/dL — ABNORMAL LOW (ref 0.61–1.24)
GFR, Estimated: >60 mL/min (ref >60)
Glucose, Bld: 123 mg/dL — ABNORMAL HIGH (ref 70–99)
Potassium: 4.6 mmol/L (ref 3.5–5.1)
Sodium: 133 mmol/L — ABNORMAL LOW (ref 135–145)
Total Bilirubin: 2.1 mg/dL — ABNORMAL HIGH (ref 0.3–1.2)
Total Protein: 5.4 g/dL — ABNORMAL LOW (ref 6.5–8.1)

## 2021-09-26 LAB — PROCALCITONIN: Procalcitonin: 12.37 ng/mL

## 2021-09-26 LAB — GLUCOSE, CAPILLARY
Glucose-Capillary: 120 mg/dL — ABNORMAL HIGH (ref 70–99)
Glucose-Capillary: 117 mg/dL — ABNORMAL HIGH (ref 70–99)
Glucose-Capillary: 126 mg/dL — ABNORMAL HIGH (ref 70–99)
Glucose-Capillary: 123 mg/dL — ABNORMAL HIGH (ref 70–99)
Glucose-Capillary: 107 mg/dL — ABNORMAL HIGH (ref 70–99)

## 2021-09-26 LAB — TYPE AND SCREEN
ABO/RH(D): O NEG
Antibody Screen: NEGATIVE
Unit division: 0
Unit division: 0

## 2021-09-26 LAB — PHOSPHORUS: Phosphorus: 2.2 mg/dL — ABNORMAL LOW (ref 2.5–4.6)

## 2021-09-26 LAB — BASIC METABOLIC PANEL
Anion gap: 9 (ref 5–15)
BUN: 24 mg/dL — ABNORMAL HIGH (ref 8–23)
CO2: 27 mmol/L (ref 22–32)
Calcium: 8.2 mg/dL — ABNORMAL LOW (ref 8.9–10.3)
Chloride: 97 mmol/L — ABNORMAL LOW (ref 98–111)
Creatinine, Ser: 0.57 mg/dL — ABNORMAL LOW (ref 0.61–1.24)
GFR, Estimated: >60 mL/min (ref >60)
Glucose, Bld: 114 mg/dL — ABNORMAL HIGH (ref 70–99)
Potassium: 4 mmol/L (ref 3.5–5.1)
Sodium: 133 mmol/L — ABNORMAL LOW (ref 135–145)

## 2021-09-26 LAB — LACTIC ACID, PLASMA: Lactic Acid, Venous: 2.4 mmol/L (ref 0.5–1.9)

## 2021-09-26 LAB — STREP PNEUMONIAE URINARY ANTIGEN: Strep Pneumo Urinary Antigen: NEGATIVE

## 2021-09-26 LAB — MAGNESIUM: Magnesium: 2.3 mg/dL (ref 1.7–2.4)

## 2021-09-26 LAB — TRIGLYCERIDES: Triglycerides: 90 mg/dL (ref <150)

## 2021-09-26 LAB — HEMOGLOBIN A1C
Hgb A1c MFr Bld: 6 % — ABNORMAL HIGH (ref 4.8–5.6)
Mean Plasma Glucose: 125.5 mg/dL

## 2021-09-26 MED ORDER — CHLORHEXIDINE GLUCONATE 0.12 % MT SOLN
OROMUCOSAL | Status: AC
Start: 2021-09-26 — End: 2021-09-26
  Filled 2021-09-26: qty 15

## 2021-09-26 MED ORDER — DILTIAZEM HCL 60 MG PO TABS
60.0000 mg | ORAL_TABLET | Freq: Three times a day (TID) | ORAL | Status: DC
  Administered 2021-09-26 – 2021-09-27 (×3): 60 mg
  Filled 2021-09-26 (×3): qty 1

## 2021-09-26 MED ORDER — INSULIN ASPART 100 UNIT/ML IJ SOLN
0.0000 [IU] | INTRAMUSCULAR | Status: DC
  Administered 2021-09-26: 3 [IU] via SUBCUTANEOUS
  Administered 2021-09-26 – 2021-09-27 (×3): 2 [IU] via SUBCUTANEOUS

## 2021-09-26 MED ORDER — FUROSEMIDE 10 MG/ML IJ SOLN
60.0000 mg | Freq: Every day | INTRAMUSCULAR | Status: DC
  Administered 2021-09-26 – 2021-09-27 (×2): 60 mg via INTRAVENOUS
  Filled 2021-09-26 (×2): qty 6

## 2021-09-26 MED ORDER — CALCIUM GLUCONATE-NACL 1-0.675 GM/50ML-% IV SOLN
1.0000 g | Freq: Once | INTRAVENOUS | Status: AC
  Administered 2021-09-26: 1000 mg via INTRAVENOUS
  Filled 2021-09-26: qty 50

## 2021-09-26 MED ORDER — SODIUM CHLORIDE 0.9 % IV SOLN
1.0000 g | INTRAVENOUS | Status: DC
  Administered 2021-09-26: 1 g via INTRAVENOUS
  Filled 2021-09-26: qty 10

## 2021-09-26 MED ORDER — SODIUM CHLORIDE 0.9 % IV SOLN
500.0000 mg | INTRAVENOUS | Status: DC
  Administered 2021-09-26 – 2021-09-27 (×2): 500 mg via INTRAVENOUS
  Filled 2021-09-26 (×2): qty 5

## 2021-09-26 NOTE — Progress Notes (Signed)
RT NOTE:  Pt was transported to and from CT with no complications noted.

## 2021-09-26 NOTE — Plan of Care (Signed)
  Problem: Activity: Goal: Capacity to carry out activities will improve Outcome: Not Progressing   Problem: Cardiac: Goal: Ability to achieve and maintain adequate cardiopulmonary perfusion will improve Outcome: Not Progressing   Problem: Education: Goal: Knowledge of disease or condition will improve Outcome: Not Progressing   Problem: Education: Goal: Understanding of medication regimen will improve Outcome: Not Progressing   Problem: Education: Goal: Individualized Educational Video(s) Outcome: Not Progressing   Problem: Activity: Goal: Ability to tolerate increased activity will improve Outcome: Not Progressing   Problem: Cardiac: Goal: Ability to achieve and maintain adequate cardiopulmonary perfusion will improve Outcome: Not Progressing   Problem: Health Behavior/Discharge Planning: Goal: Ability to safely manage health-related needs after discharge will improve Outcome: Not Progressing   Problem: Education: Goal: Knowledge of General Education information will improve Description: Including pain rating scale, medication(s)/side effects and non-pharmacologic comfort measures Outcome: Not Progressing   Problem: Health Behavior/Discharge Planning: Goal: Ability to manage health-related needs will improve Outcome: Not Progressing   Problem: Clinical Measurements: Goal: Ability to maintain clinical measurements within normal limits will improve Outcome: Not Progressing

## 2021-09-26 NOTE — Plan of Care (Signed)
  Interdisciplinary Goals of Care Family Meeting   Date carried out:: 09/26/2021  Location of the meeting: Bedside  Member's involved: Bedside Registered Nurse, Family Member or next of kin, and Other: family friend "like a daughter" Len Blalock Power of Attorney or acting medical decision maker:  Wife Nicky Kras    Discussion: We discussed goals of care for [employer] .   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 Discussed this significant progression of pt's cancer and very poor prognostic indicator.   [name] voiced understanding and stated that Mr. Ballard had previously voiced a desire not to resume chemo again.  [name] is curious what treatments would be available.   They both want to continue current level of care in hopes that he can have more quality time.  Will consult palliative care, family in agreement.     Code status: Full Code  Disposition: Continue current acute care   Time spent for the meeting: 20 minutes  Otilio Carpen Jenisa Monty 09/26/2021, 3:00 PM

## 2021-09-26 NOTE — Progress Notes (Signed)
OT Cancellation Note  Patient Details Name: [name] MRN: [id number] DOB: 1945/09/10   Cancelled Treatment:    Reason Eval/Treat Not Completed: Medical issues which prohibited therapy Patient is currently intubated at this time. OT to continue to follow and check back when patient is extubated.   Jackelyn Poling OTR/L, [facility] Office# [phone] Pager# [phone]  09/26/2021, 6:41 AM

## 2021-09-26 NOTE — Consult Note (Signed)
Consultation Note Date: 09/26/2021   Patient Name: [name]  DOB: 29-May-1945  MRN: [id number]  Age / Sex: 76 y.o., male  PCP: [facility] Referring Physician: Freddi Starr, MD  Reason for Consultation: Establishing goals of care  HPI/Patient Profile: 76 y.o. male  with past medical history of lung cancer (followed by Dr. Mindi Junker, [facility], 4 sessions of carbo/etoposide - last 06/22/21), pulmonary fibrosis, A-fib (Eliquis), interstitial lung disease, coronary artery calcification, history of kidney stones, PTSD, right TKA, and former smoker admitted on 09/23/2021 with shortness of breath, confusion, GIB, and fatigue.  Pt found to be in A-fib with RVR.   5/23 transferred to ICU with worsening mental status and respiratory status, intubated, required peripheral pressors  5/24 remains encephalopathic, pressor requirement down-trending.  PMT consulted to discuss [place].   Clinical Assessment and Goals of Care: I have reviewed medical records including EPIC notes, labs and imaging, assessed the patient and then met with patient's wife [name] at bedside to discuss diagnosis, prognosis, GOC, EOL wishes, disposition and options. She called her daughter [name] Investment banker, corporate) on speaker phone to participate in discussion.   I introduced Palliative Medicine as specialized medical care for people living with serious illness. It focuses on providing relief from the symptoms and stress of a serious illness. The goal is to improve quality of life for both the patient and the family.  We discussed a brief life review of the patient.  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  He and [name] have been married for 21 years.  [name] describes him as a very strong and caring man.  She says he relies on her for her  strength.  As far as functional and nutritional status patient was independent with all ADLs PTA.   We discussed patient's current illness and what it means in the larger context of patient's on-going co-morbidities.  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  [name] and [place] appreciate the widespread nature of his cancer.  They both accept that his situation is "grim".   I attempted to elicit values and goals of care important to the patient. Wife states she wants to focus on quality of life and keeping him free from pain.   She is NOT ready to shift to comfort, but understands that if patient is unable to ween from vent then she would discuss comfort measures.   Daughter asked if patient is able to ween from vent would he be able to go home. We discussed that we can hold hope of meeting this goal but it would necessitate pt being stable enough off of vent and pressors to be transported - highly unlikely at this juncture.  Code status was considered and discussed. Reviewed full code vs DNR. Wife verbalizes she would not want to "put him through all of that". Daughter on phone was in agreement. However, wife states  she does not want to make changes to code status now without first speaking with other family members.   Numerous times throughout our discussion I encouraged her to let nursing or medical team know if she chooses to make patient a DNR - she does not have to wait for PMT.   Therapeutic silence and active listening provided for wife to share thoughts and emotions regarding her husband's current health status.  She is appropriately tearful.  Discussed with patient/family the importance of continued conversation with family and the medical providers regarding overall plan of care and treatment options, ensuring decisions are within the context of the patient's values and GOCs.    Questions and concerns were addressed. The family was encouraged to call with  questions or concerns.  PMT will continue to follow the patient throughout his hospitalization.  Primary Decision Maker NEXT OF KIN  Code Status/Advance Care Planning: Full code  Prognosis:   < 6 weeks  Discharge Planning: To Be Determined  Primary Diagnoses: Present on Admission:  Acute respiratory failure with hypoxia (HCC)  Atrial fibrillation with RVR (HCC)  ILD (interstitial lung disease) ([place])  Small cell lung cancer (HCC)  Hyponatremia  Acute on chronic heart failure with preserved ejection fraction (HFpEF) ([place])  Acute UTI  Acute encephalopathy  Acute urinary retention   Physical Exam Vitals reviewed.  Constitutional:      Appearance: He is ill-appearing.  HENT:     Head: Normocephalic and atraumatic.  Cardiovascular:     Rate and Rhythm: Tachycardia present.  Pulmonary:     Comments: MV Abdominal:     Palpations: Abdomen is soft.  Musculoskeletal:     Comments: sedated    Palliative Assessment/Data: 20%     I discussed this patient's plan of care with patient, patient's wife, patient's daughter [name].  Thank you for this consult. Palliative medicine will continue to follow and assist holistically.   Time Total: 75 minutes Greater than 50%  of this time was spent counseling and coordinating care related to the above assessment and plan.  Signed by: Jordan Hawks, DNP, FNP-BC Palliative Medicine    Please contact Palliative Medicine Team phone at [phone] for questions and concerns.  For individual provider: See Shea Evans

## 2021-09-26 NOTE — Progress Notes (Signed)
Family and visitors reminded multiple times throughout shift that only two visitors are permitted in room and other visitors may not congregate outside of the patient's room. Reiterated by Agricultural consultant at [employer]; four family members in room at handoff. Visitation policy reiterated by night shift RN and night shift charge RN notified

## 2021-09-26 NOTE — Progress Notes (Signed)
Initial Nutrition Assessment  INTERVENTION:   TF recommendations: -Initiate Vital 1.5 @ 20 ml/hr, advance by 10 ml every 6 hours to goal rate of 50 ml/hr -90 ml Prosource TF BID -Provides at goal: 1960 kcals, 125g protein and 916 ml H20.  Recommend to aid in wound healing: - 500 mg Vitamin C BID - 220 mg Zinc sulfate daily x 14 days  -1 packet Juven BID, each packet provides 95 calories, 2.5 grams of protein (collagen), and 9.8 grams of carbohydrate (3 grams sugar); also contains 7 grams of L-arginine and L-glutamine, 300 mg vitamin C, 15 mg vitamin E, 1.2 mcg vitamin B-12, 9.5 mg zinc, 200 mg calcium, and 1.5 g  Calcium Beta-hydroxy-Beta-methylbutyrate to support wound healing    NUTRITION DIAGNOSIS:   Increased nutrient needs related to cancer and cancer related treatments as evidenced by estimated needs.  GOAL:   Patient will meet greater than or equal to 90% of their needs  MONITOR:   Vent status, Labs, Weight trends, I & O's, Skin  REASON FOR ASSESSMENT:   Ventilator    ASSESSMENT:   76 year old with history of A-fib on Eliquis, interstitial lung disease, small cell lung cancer came to the ED with fatigue, shortness of breath, confusion and hypoxia. Admitted for acute respiratory failure, requiring intubation on 5/22.  5/20: admitted 5/22: intubated  OGT placed 5/22. Per imaging, tube tip is located in the stomach toward the gastric fundus.  Patient in room with multiple staff members and other visitors standout around room. Pt being taken to have CT scan.   Per chart review, pt with metastatic small cell lung cancer, undergoing chemotherapy [facility]. Has multiple scattered wounds in sacral, buttocks areas.  Will leave TF recommendations above if pt is not able to extubated within 24 hours.   Per weight records, pt has lost 31 lbs since 4/4 (15% wt loss x 1.5 months, significant for time frame).  Patient is currently intubated on ventilator support MV: 10.7  L/min Temp (24hrs), Avg:97.8 F (36.6 C), Min:97.3 F (36.3 C), Max:98.9 F (37.2 C)  Propofol: 4.63 ml/hr -provides ~122 fat kcals  Medications: Lasix, K-Phos, Miralax, calcium gluconate, Levophed  Labs reviewed: CBGs: 117-180 Low Na Low Phos    NUTRITION - FOCUSED PHYSICAL EXAM:  Will attempt at follow-up. Pt heading to CT  Diet Order:   Diet Order             Diet NPO time specified  Diet effective now                   EDUCATION NEEDS:   Not appropriate for education at this time  Skin:  Skin Assessment: Skin Integrity Issues: Skin Integrity Issues:: Stage III, DTI, Stage II, Other (Comment) DTI: bilateral buttocks Stage II: right buttocks Stage III: bilateral coccyx Other: MASD groin  Last BM:  5/22 -type 7  Height:   Ht Readings from Last 1 Encounters:  09/26/21 6\' 2"  (1.88 m)    Weight:   Wt Readings from Last 1 Encounters:  09/26/21 76.5 kg    BMI:  Body mass index is 21.65 kg/m.  Estimated Nutritional Needs:   Kcal:  1245  Protein:  115-130g  Fluid:  1.9L/day   Clayton Bibles, MS, RD, LDN Inpatient Clinical Dietitian Contact information available via Amion

## 2021-09-26 NOTE — Progress Notes (Addendum)
NAME:  [name], MRN:  [id number], DOB:  11/24/1945, LOS: 2 ADMISSION DATE:  09/23/2021, CONSULTATION DATE: 09/25/2021 REFERRING MD: Dr. Reesa Chew, CHIEF COMPLAINT: Respiratory failure  History of Present Illness:  Patient with a history of interstitial lung disease on Ofev, history of atrial fibrillation on Eliquis has been GI bleed History of small cell lung cancer -On chemotherapy -4 sessions of chemo (carbo/etoposide) at [facility], last in February 2023  Fatigue and shortness of breath of a few days duration Recent antibiotic therapy for UTI  History of acute on chronic heart failure, preserved ejection fraction-was being diuresed He was having A-fib with RVR, started on Cardizem drip, switch to Cardizem p.o.  Noted to be having maroon stool today Went for CT angio abdomen Became encephalopathic, acute on chronic respiratory failure  Diagnosis of lung cancer was made in 11/18/2020 at [facility] with an EBUS showing metastatic small cell lung cancer   Pertinent  Medical History   Past Medical History:  Diagnosis Date   Arthritis    Cancer [facility])    Coronary artery calcification seen on CAT scan    Dysrhythmia    History of kidney stones    PAF (paroxysmal atrial fibrillation) ([place]) 08/02/2014   PTSD (post-traumatic stress disorder)    controlled   Active smoker Significant Hospital Events: Including procedures, antibiotic start and stop dates in addition to other pertinent events   5/21 presented to ED with fatigue and confusion with worsening dyspnea, GIB 5/23 transferred to ICU with worsening mental status and respiratory status, intubated, required peripheral pressors 5/24 remains encephalopathic, pressor requirement down-trending    CT chestIMPRESSION: 1. No pulmonary embolus. 2. Moderate right and small left pleural effusions, new from prior exam. Ground-glass opacities throughout the right upper and lower lobes and to a lesser extent left upper lobe, favored to  represent pulmonary edema in this setting. Possibility of atypical infection is also considered, lymphangitic spread of tumor is felt less likely. There is generalized body wall edema. 3. Right lower lobe nodule with fiducial markers has increased in size from prior exam. There are additional new pulmonary nodules in the right lower and right middle lobes, concerning for metastatic disease. Recommend continued oncologic follow-up. 4. Nonspecific right hilar adenopathy. 5. 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    CT Angio abdomen IMPRESSION: VASCULAR   Aortic atherosclerosis. No significant vascular disease. No site of gastrointestinal bleeding is identified.   NON-VASCULAR   1. Mass in the right lower lung is probably neoplastic. Moderate right pleural effusion with basilar atelectasis. Interstitial and airspace infiltrates may indicate edema or multifocal pneumonia. 2. Enlarging multiple lesions in the liver likely representing metastatic disease. 3. Diffuse bone metastasis. 4. Small left inguinal hernia containing fat. 5. Cholelithiasis. 6. Nonobstructing renal stones bilaterally.  Recent CT abdomen and pelvis 06/20/2021 from care everywhere did reveal interval decrease in right lower lobe lung mass, multiple hepatic lesions, multiple sclerotic osseous deposits favoring metastasis Interim History / Subjective:   Will briefly open his eyes, but not following commands Large episode of hematochezia with hypotension to 80's, transfused PRBCx2, Started on Levophed overnight, decreased from 72mcg to 81mcg this morning.    Objective   Blood pressure 117/70, pulse (!) 51, temperature 97.8 F (36.6 C), temperature source Axillary, resp. rate 20, height 6\' 2"  (1.88 m), weight 76.5 kg, SpO2 99 %.    Vent Mode: PSV;CPAP FiO2 (%):  [40 %-80 %] 40 % Set Rate:  [20  bmp-24 bmp] 24 bmp Vt Set:  [490 mL] 490  mL PEEP:  [5 cmH20] 5 cmH20 Pressure Support:  [5 cmH20] 5 cmH20 Plateau Pressure:  [15 cmH20-21 cmH20] 17 cmH20   Intake/Output Summary (Last 24 hours) at 09/26/2021 0809 Last data filed at 09/26/2021 3474 Gross per 24 hour  Intake 2234.62 ml  Output 2245 ml  Net -10.38 ml    Filed Weights   09/25/21 0500 09/25/21 0600 09/26/21 0409  Weight: 77.2 kg 77.2 kg 76.5 kg    General:  critically ill-appearing M intubated and sedated HEENT: MM pink/moist, pupils equal, sclera anicteric Neuro: examined on low dose propofol and fentanyl, briefly opens eyes, not otherwise responsive CV: s1s2 rrr, no m/r/g PULM:  rhonchi bilaterally, tolerative PSV 5/5,  GI: soft, non-distended Extremities: warm/dry, no edema  Skin: no rashes or lesions   Labs reviewed Hgb 13.1 WBC 11.5 Mag 2.3 Phos 2.2 Ca 7.8 Na 133  Pulmonary function test 07/15/2020 with mild restrictive lung disease, mild reduction in diffusing capacity  Echocardiogram 09/24/2021 with mild pulmonary hypertension, diastolic dysfunction  Resolved Hospital Problem list     Assessment & Plan:     Acute hypoxemic respiratory failure suspect secondary to acute on chronic HFpEF vs atypical PNA with small effusions Baseline ILD and mild pulm HTN In the setting of acute GIB On Ofev, sees Dr. Vaughan Browner CT chest with GGO's bilaterally, favored to represent edema vs atypical infection Initial BNP 287, echo with preserved EF, diastolic function not evaluated 2/2 afib -intubated, minimal vent settings this AM  -Lasix 60mg  today, monitor response -add Azith for atypical coverage -sputum culture, urine strep and legionella --Maintain full vent support with SAT/SBT as tolerated -titrate Vent setting to maintain SpO2 greater than or equal to 90%. -HOB elevated 30 degrees. -Plateau pressures less than 30 cm H20.  -Follow chest x-ray, ABG prn.   -Bronchial hygiene and RT/bronchodilator protocol.   Shock, hemorrhagic vs septic  Lactic  Acidosis Lactic rising to 2.8 overnight -repeat lactic, check procalcitonin -continue ceftriaxone and azithromycin -blood cultures x2 -large volume hematochezia overnight, Hgb stable    Lower GI bleed Acute blood loss anemia Has known AVM, on Eliquis, was seen by GI and thought to likely have diverticular bleed. Prior colonoscopies through the [place] [facility] GIB without clear source 5/22 -Eliquis held, GI following -received Kcentra last night, had episode of bleeding with hypotension, received 2 units PRBC's, Hgb 13 this AM -NPO, PPI  Acute Encephalopathy Unresponsive prior to intubation, suspect acute metabolic in the setting of hypoxia and worsening shock, mildly hypercarbic, pH normal -improving today, following commands -head CT while stable -repeat ABG -Ammonia mildly elevated 48  Atrial fibrillation with rapid ventricular response -On oral Cardizem, continue as BP improving -anti-coagulation held    Urinary tract infection -Was on Macrobid,  complete Rocephin for 7days -UC no growth  Small cell lung cancer On carboplatin and etoposide, last dose Feb 2023 -Being followed with surveillance imaging as it did have significant side effects from chemo -There is increase in the right lower lobe nodule new right middle lobe and right  -Discussed with family, had an oncology appt today, will need to follow up as soon as stable     Best Practice (right click and "Reselect all SmartList Selections" daily)   Diet/type: NPO DVT prophylaxis: SCD GI prophylaxis: PPI Lines: N/A Foley:  Yes, and it is still needed Code Status:  full code Last date of multidisciplinary goals of care discussion [updated wife and son at  the bedside 5/23]  Labs   CBC: Recent Labs  Lab 09/24/21 0524 09/25/21 0304 09/25/21 1645 09/25/21 1818 09/26/21 0454  WBC 8.9 8.8 10.1 9.2 11.5*  HGB 13.1 13.7 11.7* 10.2* 13.1  HCT 41.4 42.6 36.5* 32.7* 39.6  MCV 79.8* 79.8* 80.8 80.5 82.0  PLT 168 165  184 181 181     Basic Metabolic Panel: Recent Labs  Lab 09/23/21 2305 09/24/21 0524 09/25/21 0304 09/25/21 1818 09/25/21 1957 09/26/21 0454  NA 129* 133* 132* 129*  --  133*  K 3.8 3.2* 3.4* 4.5 4.8 4.6  CL 96* 98 96* 95*  --  98  CO2 25 24 25 26   --  26  GLUCOSE 140* 133* 122* 207*  --  123*  BUN 17 16 15 19   --  23  CREATININE 0.53* 0.38* 0.50* 0.55*  --  0.50*  CALCIUM 8.2* 8.2* 8.2* 7.3*  --  7.8*  MG  --   --  1.8  --   --  2.3  PHOS  --   --  <1.0*  --  2.9 2.2*    GFR: Estimated Creatinine Clearance: 86.3 mL/min (A) (by C-G formula based on SCr of 0.5 mg/dL (L)). Recent Labs  Lab 09/25/21 0304 09/25/21 1645 09/25/21 1818 09/25/21 1957 09/25/21 2211 09/26/21 0454  WBC 8.8 10.1 9.2  --   --  11.5*  LATICACIDVEN  --   --   --  2.4* 2.8*  --      Liver Function Tests: Recent Labs  Lab 09/26/21 0454  AST 78*  ALT 45*  ALKPHOS 255*  BILITOT 2.1*  PROT 5.4*  ALBUMIN 2.5*   No results for input(s): LIPASE, AMYLASE in the last 168 hours. Recent Labs  Lab 09/25/21 1957  AMMONIA 48*    ABG    Component Value Date/Time   PHART 7.31 (L) 09/25/2021 2045   PCO2ART 64 (H) 09/25/2021 2045   PO2ART 156 (H) 09/25/2021 2045   HCO3 32.5 (H) 09/25/2021 2045   O2SAT 98.7 09/25/2021 2045      Coagulation Profile: Recent Labs  Lab 09/25/21 1818  INR 1.8*     Cardiac Enzymes: No results for input(s): CKTOTAL, CKMB, CKMBINDEX, TROPONINI in the last 168 hours.  HbA1C: Hgb A1c MFr Bld  Date/Time Value Ref Range Status  09/26/2021 04:54 AM 6.0 (H) 4.8 - 5.6 % Final    Comment:    (NOTE) Pre diabetes:          5.7%-6.4%  Diabetes:              >6.4%  Glycemic control for   <7.0% adults with diabetes     CBG: Recent Labs  Lab 09/25/21 2022 09/25/21 2330 09/26/21 0342  GLUCAP 199* 180* 120*    Review of Systems:   Unable to provide history  Past Medical History:  He,  has a past medical history of Arthritis, Cancer ([employer]), Coronary  artery calcification seen on CAT scan, Dysrhythmia, History of kidney stones, PAF (paroxysmal atrial fibrillation) ([place]) (08/02/2014), and PTSD (post-traumatic stress disorder).   Surgical History:   Past Surgical History:  Procedure Laterality Date   BACK SURGERY     2000   BRONCHIAL BIOPSY  08/08/2020   Procedure: BRONCHIAL BIOPSIES;  Surgeon: Collene Gobble, MD;  Location: [place];  Service: Cardiopulmonary;;   BRONCHIAL BRUSHINGS  08/08/2020   Procedure: BRONCHIAL BRUSHINGS;  Surgeon: Collene Gobble, MD;  Location[facility] ENDOSCOPY;  Service: Cardiopulmonary;;   BRONCHIAL NEEDLE  ASPIRATION BIOPSY  08/08/2020   Procedure: BRONCHIAL NEEDLE ASPIRATION BIOPSIES;  Surgeon: Collene Gobble, MD;  Location: [place] ENDOSCOPY;  Service: Cardiopulmonary;;   CARPAL TUNNEL RELEASE Right    FIDUCIAL MARKER PLACEMENT  08/08/2020   Procedure: FIDUCIAL MARKER PLACEMENT;  Surgeon: Collene Gobble, MD;  Location: [facility] ENDOSCOPY;  Service: Cardiopulmonary;;   JOINT REPLACEMENT      left 2005   SHOULDER ARTHROSCOPY W/ ROTATOR CUFF REPAIR  2012   right   TOTAL KNEE ARTHROPLASTY  08/28/2011   Procedure: TOTAL KNEE ARTHROPLASTY;  Surgeon: Garald Balding, MD;  Location: [place];  Service: Orthopedics;  Laterality: Right;   VIDEO BRONCHOSCOPY WITH ENDOBRONCHIAL NAVIGATION  08/08/2020   Procedure: VIDEO BRONCHOSCOPY WITH ENDOBRONCHIAL NAVIGATION;  Surgeon: Collene Gobble, MD;  Location: [facility] ENDOSCOPY;  Service: Cardiopulmonary;;     Social History:   reports that he quit smoking about 10 months ago. His smoking use included cigarettes. He started smoking about 41 years ago. He has a 20.00 pack-year smoking history. He has never used smokeless tobacco. He reports that he does not drink alcohol and does not use drugs.   Family History:  His family history includes Alcoholism in his brother; HIV in his brother; Heart attack in his maternal grandfather; Kidney cancer in his father; Lung cancer in his mother. There is no  history of Anesthesia problems, Hypotension, Malignant hyperthermia, or Pseudochol deficiency.   Allergies No Known Allergies   Home Medications  Prior to Admission medications   Medication Sig Start Date End Date Taking? Authorizing Provider  apixaban (ELIQUIS) 5 MG TABS tablet Take 5 mg by mouth 2 (two) times daily.   Yes [provider]  Brompheniramine-Pseudoeph (BROMALINE PO) Take 2 capsules by mouth daily.   Yes [provider]  GLUTATHIONE PO Take 5 mLs by mouth daily.   Yes [provider]  Nintedanib (OFEV) 150 MG CAPS Take 1 capsule (150 mg total) by mouth 2 (two) times daily. Patient taking differently: Take 150 mg by mouth daily. 05/26/21  Yes Mannam, Praveen, MD  OVER THE COUNTER MEDICATION Take 2 capsules by mouth daily. Probiotic + Prebiotic + Postbiotic Combination. Ageless Biome by [employer].   Yes [provider]  OVER THE COUNTER MEDICATION Apply 5 drops topically 2 (two) times daily. CBD+ Blackseed Oil Combination   Yes [provider]  OVER THE COUNTER MEDICATION Take 2 capsules by mouth daily. Fuceidan   Yes [provider]  phosphorus (K PHOS NEUTRAL) 155-852-130 MG tablet Take 2 tablets by mouth in the morning and at bedtime. 05/03/21  Yes [provider]  vitamin C (ASCORBIC ACID) 500 MG tablet Take 500 mg by mouth daily.   Yes [provider]  Zinc 50 MG TABS Take 50 mg by mouth daily.   Yes [provider]   CRITICAL CARE Performed by: Otilio Carpen Gleason   Total critical care time: 40 minutes  Critical care time was exclusive of separately billable procedures and treating other patients.  Critical care was necessary to treat or prevent imminent or life-threatening deterioration.  Critical care was time spent personally by me on the following activities: development of treatment plan with patient and/or surrogate as well as nursing, discussions with consultants, evaluation of patient's  response to treatment, examination of patient, obtaining history from patient or surrogate, ordering and performing treatments and interventions, ordering and review of laboratory studies, ordering and review of radiographic studies, pulse oximetry and re-evaluation of patient's condition.   Mickel Baas  R Gleason, PA-C  Pulmonary & Critical care See Amion for pager If no response to pager , please call [phone] until 7pm After 7:00 pm call Elink  [phone]

## 2021-09-26 NOTE — Progress Notes (Signed)
[facility] Gastroenterology Progress Note  [name] 76 y.o. 19-Oct-1945   Subjective: Patient had another episode of significant rectal bleeding last night.  Patient required ventilation and Levophed.  Eliquis reversal agent was given.  Patient was given 2 units packed red blood cells.  Patient hemoglobin stable  at 13 after transfusions.  No episodes of bright red bleeding this morning.  Family noted maroon stools.  ROS : Review of Systems  Unable to perform ROS: Intubated     Objective: Vital signs in last 24 hours: Vitals:   09/26/21 1145 09/26/21 1200  BP: 106/74 118/76  Pulse:  (!) 50  Resp:  (!) 24  Temp:  (!) 97.4 F (36.3 C)  SpO2:  100%    Physical Exam:  General:  Alert, no distress, appears stated age  Head:  Normocephalic, without obvious abnormality, atraumatic  Eyes:  Anicteric sclera, EOM's intact  Heart:  Regular rate and rhythm, S1, S2 normal  Abdomen:   Soft, non-tender, bowel sounds active all four quadrants,  no masses,   Extremities: Extremities normal, atraumatic, no  edema  Pulses: 2+ and symmetric    Lab Results: Recent Labs    09/25/21 0304 09/25/21 1818 09/25/21 1957 09/26/21 0454  NA 132* 129*  --  133*  K 3.4* 4.5 4.8 4.6  CL 96* 95*  --  98  CO2 25 26  --  26  GLUCOSE 122* 207*  --  123*  BUN 15 19  --  23  CREATININE 0.50* 0.55*  --  0.50*  CALCIUM 8.2* 7.3*  --  7.8*  MG 1.8  --   --  2.3  PHOS <1.0*  --  2.9 2.2*   Recent Labs    09/26/21 0454  AST 78*  ALT 45*  ALKPHOS 255*  BILITOT 2.1*  PROT 5.4*  ALBUMIN 2.5*   Recent Labs    09/25/21 1818 09/26/21 0454  WBC 9.2 11.5*  HGB 10.2* 13.1  HCT 32.7* 39.6  MCV 80.5 82.0  PLT 181 181   Recent Labs    09/25/21 1818  LABPROT 20.9*  INR 1.8*      Assessment Hematochezia   Hgb stable at 13.7 at 5/22 prior to rectal bleeding.  Hemoglobin now 13.1 after 2 units packed red blood cells and bleeding overnight.  Possible diverticular bleeding given rapid onset of  bright red blood.  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  Patient at higher risk for GI bleed with chronic anticoagulation with Eliquis.  Eliquis has been held and reversal agent given.     At this time patient has no further episodes of rectal bleeding since last night. We will continue to monitor.   No melena.  BUN normal.  Unlikely upper GI bleed.  CT angio 09/25/2021 No significant vascular disease. No site of gastrointestinal bleeding is identified.     Plan: Continue supportive care Continue to monitor hemoglobin, transfuse if less than 7.  Continue to hold Eliquis. [facility] will follow.  Arvella Nigh Orson Rho PA-C 09/26/2021, 12:43 PM  Contact #  [phone]

## 2021-09-27 ENCOUNTER — Encounter (HOSPITAL_COMMUNITY): Payer: Self-pay

## 2021-09-27 DIAGNOSIS — J9601 Acute respiratory failure with hypoxia: Secondary | ICD-10-CM | POA: Diagnosis not present

## 2021-09-27 DIAGNOSIS — F4310 Post-traumatic stress disorder, unspecified: Secondary | ICD-10-CM

## 2021-09-27 DIAGNOSIS — Z515 Encounter for palliative care: Secondary | ICD-10-CM

## 2021-09-27 DIAGNOSIS — Z66 Do not resuscitate: Secondary | ICD-10-CM | POA: Diagnosis not present

## 2021-09-27 DIAGNOSIS — I5033 Acute on chronic diastolic (congestive) heart failure: Secondary | ICD-10-CM | POA: Diagnosis not present

## 2021-09-27 DIAGNOSIS — C7931 Secondary malignant neoplasm of brain: Secondary | ICD-10-CM

## 2021-09-27 DIAGNOSIS — G9340 Encephalopathy, unspecified: Secondary | ICD-10-CM | POA: Diagnosis not present

## 2021-09-27 DIAGNOSIS — J849 Interstitial pulmonary disease, unspecified: Secondary | ICD-10-CM | POA: Diagnosis not present

## 2021-09-27 LAB — BASIC METABOLIC PANEL
Anion gap: 9 (ref 5–15)
BUN: 28 mg/dL — ABNORMAL HIGH (ref 8–23)
CO2: 27 mmol/L (ref 22–32)
Calcium: 8.1 mg/dL — ABNORMAL LOW (ref 8.9–10.3)
Chloride: 100 mmol/L (ref 98–111)
Creatinine, Ser: 0.51 mg/dL — ABNORMAL LOW (ref 0.61–1.24)
GFR, Estimated: >60 mL/min (ref >60)
Glucose, Bld: 107 mg/dL — ABNORMAL HIGH (ref 70–99)
Potassium: 4.1 mmol/L (ref 3.5–5.1)
Sodium: 136 mmol/L (ref 135–145)

## 2021-09-27 LAB — PHOSPHORUS: Phosphorus: 1.8 mg/dL — ABNORMAL LOW (ref 2.5–4.6)

## 2021-09-27 LAB — PROCALCITONIN: Procalcitonin: 12.03 ng/mL

## 2021-09-27 LAB — CBC
HCT: 35.9 % — ABNORMAL LOW (ref 39.0–52.0)
Hemoglobin: 11.9 g/dL — ABNORMAL LOW (ref 13.0–17.0)
MCH: 27.2 pg (ref 26.0–34.0)
MCHC: 33.1 g/dL (ref 30.0–36.0)
MCV: 82.2 fL (ref 80.0–100.0)
Platelets: 133 10*3/uL — ABNORMAL LOW (ref 150–400)
RBC: 4.37 MIL/uL (ref 4.22–5.81)
RDW: 19.5 % — ABNORMAL HIGH (ref 11.5–15.5)
WBC: 10.1 10*3/uL (ref 4.0–10.5)
nRBC: 0.8 % — ABNORMAL HIGH (ref 0.0–0.2)

## 2021-09-27 LAB — MAGNESIUM: Magnesium: 2 mg/dL (ref 1.7–2.4)

## 2021-09-27 LAB — GLUCOSE, CAPILLARY
Glucose-Capillary: 107 mg/dL — ABNORMAL HIGH (ref 70–99)
Glucose-Capillary: 107 mg/dL — ABNORMAL HIGH (ref 70–99)
Glucose-Capillary: 118 mg/dL — ABNORMAL HIGH (ref 70–99)
Glucose-Capillary: 122 mg/dL — ABNORMAL HIGH (ref 70–99)

## 2021-09-27 MED ORDER — ORAL CARE MOUTH RINSE
15.0000 mL | Freq: Two times a day (BID) | OROMUCOSAL | Status: DC
  Administered 2021-09-28 (×2): 15 mL via OROMUCOSAL

## 2021-09-27 MED ORDER — BIOTENE DRY MOUTH MT LIQD: 15.0000 mL | OROMUCOSAL | Status: DC | PRN

## 2021-09-27 MED ORDER — DILTIAZEM HCL 60 MG PO TABS: 60.0000 mg | ORAL_TABLET | Freq: Three times a day (TID) | ORAL | Status: DC

## 2021-09-27 MED ORDER — GLYCOPYRROLATE 0.2 MG/ML IJ SOLN
0.2000 mg | INTRAMUSCULAR | Status: DC | PRN
  Administered 2021-09-27 – 2021-09-28 (×3): 0.2 mg via INTRAVENOUS
  Filled 2021-09-27 (×3): qty 1

## 2021-09-27 MED ORDER — POLYETHYLENE GLYCOL 3350 17 G PO PACK: 17.0000 g | PACK | Freq: Every day | ORAL | Status: DC

## 2021-09-27 MED ORDER — CHLORHEXIDINE GLUCONATE 0.12 % MT SOLN
15.0000 mL | Freq: Two times a day (BID) | OROMUCOSAL | Status: DC
  Administered 2021-09-28: 15 mL via OROMUCOSAL

## 2021-09-27 MED ORDER — SODIUM PHOSPHATES 45 MMOLE/15ML IV SOLN
20.0000 mmol | Freq: Once | INTRAVENOUS | Status: DC
  Filled 2021-09-27: qty 6.67

## 2021-09-27 MED ORDER — DOCUSATE SODIUM 100 MG PO CAPS: 100.0000 mg | ORAL_CAPSULE | Freq: Two times a day (BID) | ORAL | Status: DC

## 2021-09-27 MED ORDER — PANTOPRAZOLE SODIUM 40 MG PO TBEC: 40.0000 mg | DELAYED_RELEASE_TABLET | Freq: Every day | ORAL | Status: DC

## 2021-09-27 MED ORDER — GLYCOPYRROLATE 0.2 MG/ML IJ SOLN: 0.2000 mg | INTRAMUSCULAR | Status: DC | PRN

## 2021-09-27 MED ORDER — K PHOS MONO-SOD PHOS DI & MONO 155-852-130 MG PO TABS: 500.0000 mg | ORAL_TABLET | Freq: Two times a day (BID) | ORAL | Status: DC

## 2021-09-27 MED ORDER — POLYVINYL ALCOHOL 1.4 % OP SOLN: 1.0000 [drp] | Freq: Four times a day (QID) | OPHTHALMIC | Status: DC | PRN

## 2021-09-27 MED ORDER — GLYCOPYRROLATE 1 MG PO TABS: 1.0000 mg | ORAL_TABLET | ORAL | Status: DC | PRN

## 2021-09-27 MED ORDER — MORPHINE SULFATE (PF) 2 MG/ML IV SOLN
1.0000 mg | INTRAVENOUS | Status: DC | PRN
  Administered 2021-09-28: 1 mg via INTRAVENOUS
  Filled 2021-09-27: qty 1

## 2021-09-27 MED ORDER — HALOPERIDOL 1 MG PO TABS
0.5000 mg | ORAL_TABLET | ORAL | Status: DC | PRN
Start: 2021-09-27 — End: 2021-09-29

## 2021-09-27 MED ORDER — HALOPERIDOL LACTATE 2 MG/ML PO CONC: 0.5000 mg | ORAL | Status: DC | PRN

## 2021-09-27 MED ORDER — HALOPERIDOL LACTATE 5 MG/ML IJ SOLN: 0.5000 mg | INTRAMUSCULAR | Status: DC | PRN

## 2021-09-27 NOTE — Progress Notes (Signed)
[facility] Gastroenterology Progress Note  [name] 76 y.o. [birth year]   Subjective: Seen and examined laying in bed.  Currently intubated, alert somewhat following commands.  Plans to trial extubation today.  No more episodes of rectal bleeding.  ROS : Review of Systems  Unable to perform ROS: Intubated     Objective: Vital signs in last 24 hours: Vitals:   09/27/21 0800 09/27/21 0802  BP:  104/75  Pulse:  (!) 109  Resp:  20  Temp:    SpO2: 99%     Physical Exam:  General:  Alert, no distress, appears stated age, ill appearing, intubated  Head:  Normocephalic, without obvious abnormality, atraumatic  Eyes:  Anicteric sclera, EOM's intact  Heart:  Regular rate and rhythm, S1, S2 normal  Abdomen:   Soft, non-tender, bowel sounds active all four quadrants,  no masses,   Extremities: Extremities normal, atraumatic, no  edema  Pulses: 2+ and symmetric    Lab Results: Recent Labs    09/26/21 0454 09/26/21 1532 09/27/21 0243  NA 133* 133* 136  K 4.6 4.0 4.1  CL 98 97* 100  CO2 26 27 27   GLUCOSE 123* 114* 107*  BUN 23 24* 28*  CREATININE 0.50* 0.57* 0.51*  CALCIUM 7.8* 8.2* 8.1*  MG 2.3  --  2.0  PHOS 2.2*  --  1.8*   Recent Labs    09/26/21 0454  AST 78*  ALT 45*  ALKPHOS 255*  BILITOT 2.1*  PROT 5.4*  ALBUMIN 2.5*   Recent Labs    09/26/21 0454 09/27/21 0243  WBC 11.5* 10.1  HGB 13.1 11.9*  HCT 39.6 35.9*  MCV 82.0 82.2  PLT 181 133*   Recent Labs    09/25/21 1818  LABPROT 20.9*  INR 1.8*      Assessment Hematochezia   Hgb stable at 13.7 at 5/22 prior to rectal bleeding.  Hemoglobin now 11.9 after 2 units packed red blood cells on 5/23.  Possible diverticular bleeding given rapid onset of bright red blood.  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  Patient at higher risk for GI bleed with chronic anticoagulation with Eliquis.  Eliquis has  been held and reversal agent given.     At this time patient has no further episodes of rectal bleeding since last night. We will continue to monitor.   No melena.  BUN normal.  Unlikely upper GI bleed.   CT angio 09/25/2021 No significant vascular disease. No site of gastrointestinal bleeding is identified.  Palliative was consulted family did not choose comfort care at this time and will continue to see how patient progresses.   Plan: Continue supportive care Continue to monitor hemoglobin, transfuse if less than 7.  No plan for procedure at this time. [facility] will follow.  Arvella Nigh Reese Senk PA-C 09/27/2021, 9:05 AM  Contact #  [phone]

## 2021-09-27 NOTE — IPAL (Signed)
  Interdisciplinary Goals of Care Family Meeting   Date carried out:: 09/27/2021  Location of the meeting: Bedside  Member's involved: Physician, Nurse Practitioner, Bedside Registered Nurse, and Family Member or next of kin  Durable Power of Attorney or acting medical decision maker: wife     Discussion: We discussed goals of care for [employer] .  Further discussion at bedside. Now full DNR   Code status: Full DNR  Disposition: Home with Hospice   Time spent for the meeting: 23 min   Clementeen Graham 09/27/2021, 9:45 AM

## 2021-09-27 NOTE — Progress Notes (Signed)
Palliative Care Progress Note, Assessment & Plan   Patient Name: [name]       Date: 09/27/2021 DOB: 1946/04/28  Age: 76 y.o. MRN#: [id number] Attending Physician: Freddi Starr, MD Primary Care Physician: [facility] Admit Date: 09/23/2021  Reason for Consultation/Follow-up: Establishing goals of care  Subjective: Patient is sitting in bed in no apparent distress.  He denies any nausea presence and is not able to make his wishes known.  No response to verbal or physical stimuli. Patient's wife, son, DIL, sister, and friend are at bedside.   HPI: 76 y.o. male  with past medical history of lung cancer (followed by Dr. Mindi Junker, [facility], 4 sessions of carbo/etoposide - last 06/22/21), pulmonary fibrosis, A-fib (Eliquis), interstitial lung disease, coronary artery calcification, history of kidney stones, PTSD, right TKA, and former smoker admitted on 09/23/2021 with shortness of breath, confusion, GIB, and fatigue.  Pt found to be in A-fib with RVR.    5/22 transferred to ICU with worsening mental status and respiratory status, intubated, required peripheral pressors   5/23 remains encephalopathic, pressor requirement down-trending.   5/24, pt successfully extubated. Family discussed hospice with CCM.  Summary of counseling/coordination of care: After reviewing the patient's chart and assessing the patient at bedside, patient endorsed to discuss disposition.  She would like patient to go to the [facility] hosp[facility] facility.  When asked why it needed to be a [facility] hosp[facility] patient's wife shared that it would 100% be paid for by the [place]. Educated wife that hospice services are federally provided benefit -regardless of [employer].  Pt's wife verbalized she and family are not able to provide 24/7  care for him at home and would like him to be evaluated for inpatient hospice facilities. She and family asked appropriate questions regarding inpatient hospice, hospice philosophy, and how that transition will be for the patient.  Comfort care measures discussed in detail. I outlined that full comfort measures will be started here in the hospital and this plan of care focused solely on comfort will be continued at the hospice inpatient facility.   Reviewed the hospice eligibility and approval is dependent on hospice's evaluation of patient. I shared that TOC SW will offer choice of hospices with inpatient options to family and contact the appropriate hospices.  Discussed that comfort measures include the following: -No up titration of supplemental oxygen, BP support, or any other means to prolong life artifically -Discontinuation of any medications not solely focused on comfort -Discontinuation of all future labs/procedures -Unrestricted visitor access -Medications available as needed for pain, agitation, restlessness, nausea, vomiting, or anxiety  Questions and concerns were addressed.  PMT will continue to follow patient throughout his hospitalization.  Family was encouraged to call with any questions or concerns.  Code Status: DNR  Prognosis: < 2 weeks  Discharge Planning: Hospice facility  Recommendations/Plan: Evaluation by hospice for inpatient placement Pt seems appropriate for transfer out of ICU/stepdown if med surg bed is available  Care plan was discussed with patient, patient's wife, patient's son [name]  Physical Exam Vitals reviewed.  Constitutional:      Appearance: He is ill-appearing and toxic-appearing.  HENT:     Head: Normocephalic and atraumatic.  Eyes:     Comments: No tracking or purposeful occular movement  Cardiovascular:     Rate and Rhythm: Normal rate.  Pulmonary:     Effort: No accessory muscle usage.     Breath sounds: Examination of the  right-middle field reveals rhonchi. Examination of the left-middle field reveals rhonchi. Examination of the right-lower field reveals rhonchi. Examination of the left-lower field reveals rhonchi. Rhonchi present.     Comments: 2L [place] Abdominal:     Palpations: Abdomen is soft.  Musculoskeletal:     Comments: No purposeful movement, does not MAETC  Skin:    General: Skin is warm.  Neurological:     Comments: nonverbal            Palliative Assessment/Data: 20%    Total Time 50 minutes  Greater than 50%  of this time was spent counseling and coordinating care related to the above assessment and plan.  Thank you for allowing the Palliative Medicine Team to assist in the care of this patient.  [place] Ilsa Iha, FNP-BC Palliative Medicine Team Team Phone # [phone]

## 2021-09-27 NOTE — Progress Notes (Signed)
Nutrition Brief Note  Patient discussed in rounds this AM. Chart reviewed. Palliative Care met with family earlier this afternoon.  Patient now transitioning to comfort care.  No further nutrition interventions planned at this time. Please re-consult as needed.      Jarome Matin, MS, RD, LDN Registered Dietitian II Inpatient Clinical Nutrition RD pager # and on-call/weekend pager # available in [facility]

## 2021-09-27 NOTE — Progress Notes (Addendum)
NAME:  [name], MRN:  [id number], DOB:  Oct 09, 1945, LOS: 3 ADMISSION DATE:  09/23/2021, CONSULTATION DATE: 09/25/2021 REFERRING MD: Dr. Reesa Chew, CHIEF COMPLAINT: Respiratory failure  History of Present Illness:  Patient with a history of interstitial lung disease on Ofev, history of atrial fibrillation on Eliquis has been GI bleed History of small cell lung cancer -On chemotherapy -4 sessions of chemo (carbo/etoposide) at [facility], last in February 2023  Fatigue and shortness of breath of a few days duration Recent antibiotic therapy for UTI  History of acute on chronic heart failure, preserved ejection fraction-was being diuresed He was having A-fib with RVR, started on Cardizem drip, switch to Cardizem p.o.  Noted to be having maroon stool today Went for CT angio abdomen Became encephalopathic, acute on chronic respiratory failure  Diagnosis of lung cancer was made in 11/18/2020 at [facility] with an EBUS showing metastatic small cell lung cancer   Pertinent  Medical History   Past Medical History:  Diagnosis Date   Arthritis    Cancer [facility])    Coronary artery calcification seen on CAT scan    Dysrhythmia    History of kidney stones    PAF (paroxysmal atrial fibrillation) ([place]) 08/02/2014   PTSD (post-traumatic stress disorder)    controlled   Active smoker Significant Hospital Events: Including procedures, antibiotic start and stop dates in addition to other pertinent events   5/21 presented to ED with fatigue and confusion with worsening dyspnea, GIB. Echocardiogram 09/24/2021 with mild pulmonary hypertension, diastolic dysfunction 7/12: CT chest 1. No pulmonary embolus. 2. Moderate right and small left pleural effusions, new from prior exam. Ground-glass opacities throughout the right upper and lower lobes and to a lesser extent left upper lobe, favored to represent pulmonary edema in this setting. Possibility of atypical infection is also considered, lymphangitic spread  of tumor is felt less likely. There is generalized body wall edema. 3. Right lower lobe nodule with fiducial markers has increased in size from prior exam. There are additional new pulmonary nodules in the right lower and right middle lobes, concerning for metastatic disease. Recommend continued oncologic follow-up. 4. Nonspecific right hilar adenopathy. 5. 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 5/23 transferred to ICU with worsening mental status and respiratory status, intubated, required peripheral pressors. -received Kcentra had episode of bleeding with hypotension, received 2 units PRBC's, Hgb 13  5/23 remains encephalopathic, pressor requirement down-trending. GOC started. Still full code.   5/24 awake. Following commands. Stopping sedation. SBT Interim History / Subjective:  M temp 98.9, no events overnight    Objective   Blood pressure 104/75, pulse (Abnormal) 109, temperature 98.9 F (37.2 C), temperature source Axillary, resp. rate 20, height 6\' 2"  (1.88 m), weight 76.5 kg, SpO2 99 %.    Vent Mode: CPAP;PSV FiO2 (%):  [30 %-40 %] 30 % Set Rate:  [24 bmp] 24 bmp Vt Set:  [490 mL] 490 mL PEEP:  [5 cmH20] 5 cmH20 Pressure Support:  [5 cmH20] 5 cmH20 Plateau Pressure:  [16 cmH20-19 cmH20] 17 cmH20   Intake/Output Summary (Last 24 hours) at 09/27/2021 0944 Last data filed at 09/27/2021 0658 Gross per 24 hour  Intake 948.13 ml  Output 1500 ml  Net -551.87 ml   Filed Weights   09/25/21 0600 09/26/21 0409 09/27/21 0352  Weight: 77.2 kg 76.5 kg 76.5 kg   General 76 year old male currently on PSV HENT NCAT no JVD  Pulm coarse scattered rhonchi. VTs  800s on PSV 5 no accessory use Card irreg irreg afib on tele  Abd soft not tender Ext diffuse episodes of ecchymosis. Dependent edema Neuro more awake. Moving all ext. Will follow commands.  Gu clear   Resolved Hospital Problem list    Shock, hemorrhagic vs  septic  Lactic acidosis  Assessment & Plan:   Principal Problem:   Acute respiratory failure with hypoxia (HCC) Active Problems:   ILD (interstitial lung disease) (HCC)   Small cell lung cancer (HCC)   Atrial fibrillation with RVR (HCC)   Hyponatremia   Acute on chronic heart failure with preserved ejection fraction (HFpEF) ([place])   Acute UTI   Acute encephalopathy   Acute urinary retention   Malignant neoplasm of lung (HCC)   Pressure injury of skin   Acute pulmonary edema (HCC)   Metastasis to brain [facility])   Palliative care by specialist   PTSD (post-traumatic stress disorder)   DNR (do not resuscitate)    Acute hypoxemic respiratory failure suspect secondary to acute on chronic HFpEF vs atypical PNA with small effusions Baseline ILD and mild pulm HTN In the setting of acute GIB On Ofev, sees Dr. Vaughan Browner -looks good on SBT today  Plan Dc sedating gtts Extubate assuming Mental status allows Cont abx day 5/7 ceftriaxone and day 2 of 3 azith Daily assessment for lasix VAP bundle while on vent    Lower GI bleed w/ associated acute blood loss anemia Has known AVM, on Eliquis, was seen by GI and thought to likely have diverticular bleed. Prior colonoscopies through the [place] [facility] GIB without clear source 5/22 Plan Holding DOAC today Cont PPI Trend cbc Will need to decide of risk/benefit of further DOAC at this point risk seems to outweigh   Acute Encephalopathy: felt multifactorial from hypercarbia and shock & brain Mets Plan EEG PAD protocol 0    PTSD Plan At home was on CBD.  First line rx is SSRI. Will wait and see what his EEG shows. ? 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) On carboplatin and etoposide, last dose Feb 2023 Plan Palliative following   Atrial fibrillation with rapid ventricular response Plan Tele Rate control Holding ac    Urinary  tract infection -Was on Macrobid,  complete Rocephin for 7days -UC no growth Plan Complete 7d ceftriaxone   Best Practice (right click and "Reselect all SmartList Selections" daily)   Diet/type: NPO DVT prophylaxis: SCD GI prophylaxis: PPI Lines: N/A [place]:  Yes, and it is still needed Code Status:  full code Last date of multidisciplinary goals of care discussion [updated wife and son at the bedside 5/23  My cct 32 min   CRITICAL CARE Performed by: Clementeen Graham

## 2021-09-27 NOTE — Procedures (Incomplete)
Patient Name: [name]  MRN: [id number]  Epilepsy Attending: Lora Havens  Referring Physician/Provider: Erick Colace, NP Date: 09/27/2021 Duration:   Patient history: 76yo M with ams. EEG to evaluate for seizure  Level of alertness: Awake, drowsy, sleep, comatose, lethargic ***  AEDs during EEG study: None  Technical aspects: This EEG study was done with scalp electrodes positioned according to the 10-20 International system of electrode placement. Electrical activity was acquired at a sampling rate of 500Hz  and reviewed with a high frequency filter of 70Hz  and a low frequency filter of 1Hz . EEG data were recorded continuously and digitally stored.   Description: The posterior dominant rhythm consists of 9-10 Hz activity of moderate voltage (25-35 uV) seen predominantly in posterior head regions, symmetric and reactive to eye opening and eye closing. Drowsiness was characterized by attenuation of the posterior background rhythm. Sleep was characterized by vertex waves, sleep spindles (12 to 14 Hz), maximal frontocentral region.  There is an excessive amount of 15 to 18 Hz, 2-3 uV beta activity with irregular morphology distributed symmetrically and diffusely.   EEG showed continuous/intermittent generalized polymorphic sharply contoured 3 to 6 Hz theta-delta slowing.  EEG showed generalized periodic discharges with triphasic morphology at  Hz, more prominent when awake/stimulated.  Generalized Spike/Polyspikes/Sharp waves were noted in left/right frontal/temporal/parietal/occipital region.   Seizure was noted arising from left/right frontal/temporal/parietal/occipital region.  During seizure, patient was noted to.  Onset of seizure, total duration  Event button was pressed on at for .  Concomitant EEG before, during and after the event showed normal posterior dominant rhythm, did not show any EEG changes suggest seizure.  Hyperventilation did not show any EEG change.   Physiologic photic driving was not seen during photic stimulation.  Hyperventilation and photic stimulation were not performed.     ABNORMALITY -Sharp wave, generalized, left/right frontal/temporal/parietal/occipital region.  -Spike,generalized, left/right frontal/temporal/parietal/occipital region.  -Polyspikes, generalized, left/right frontal/temporal/parietal/occipital region.  - Lateralized periodic discharges with overriding fast activity ( LPD +) left/right, maximal frontal/temporal/parietal/occipital region - Periodic discharges with triphasic morphology, generalized ( GPDs) - Intermittent slow, generalized - Continuous slow, generalized - Excessive beta, generalized    IMPRESSION: This study is within normal limits.  This study is suggestive of mild/moderate/severe diffuse encephalopathy, nonspecific etiology but likely related to sedation, toxic-metabolic etiology, anoxic/hypoxic brain injury This study is suggestive of cortical dysfunction arising from left/right frontal/temporal/parietal/occipital region, nonspecific etiology, likely secondary to underlying structural abnormality This study showed evidence of potential epileptogenicity arising from This study showed seizures arising from No seizures or epileptiform discharges were seen throughout the recording. However, only wakefulness and drowsiness were recorded. If suspicion for interictal activity remains a concern, a prolonged study including sleep should be considered.  The excessive beta activity seen in the background is most likely due to the effect of benzodiazepine and is a benign EEG pattern.   Kanae Ignatowski Barbra Sarks '

## 2021-09-27 NOTE — Plan of Care (Signed)
  Problem: Coping: Goal: Level of anxiety will decrease Outcome: Progressing   Problem: Pain Managment: Goal: General experience of comfort will improve Outcome: Progressing   Problem: Nutrition: Goal: Adequate nutrition will be maintained Outcome: Not Progressing

## 2021-09-27 NOTE — Progress Notes (Signed)
Chaplain engaged in an initial visit with [name] and his family.  Chaplain offered reflective and intentional listening as they shared about his life and many accomplishments.  Mrs. Ara described [name] as a "servant."  He was involved and invested in various [employer].  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  [name] has received various accolades throughout his career as a [place] [employer], Engineer, structural, and Games developer.  Family also talked about the ways [name] enjoys laughter and is connected to his faith in 58.   Chaplain and family surrounded [name] in prayer.  Chaplain offered support as they need it.     09/27/21 1300  Clinical Encounter Type  Visited With Patient and family together  Visit Type Initial;Spiritual support  Referral From Family  Consult/Referral To Chaplain  Spiritual Encounters  Spiritual Needs Prayer

## 2021-09-27 NOTE — Progress Notes (Signed)
PT Cancellation Note  Patient Details Name: [name] MRN: [id number] DOB: 01/08/1946   Cancelled Treatment:    Reason Eval/Treat Not Completed: Medical issues which prohibited therapy Tresa Endo PT Acute Rehabilitation Services Pager [phone] Office [phone]   Claretha Cooper 09/27/2021, 7:46 AM

## 2021-09-27 NOTE — TOC Initial Note (Signed)
Transition of Care [facility]) - Initial/Assessment Note    Patient Details  Name: [name] MRN: [id number] Date of Birth: 1946-02-14  [facility]) CM/SW Contact:    Lennart Pall, LCSW Phone Number: 09/27/2021, 1:41 PM  Clinical Narrative:                 Met with pt's spouse and son to discuss plans for residential hospice per PMT family meeting.  Both confirm this plan and are requesting referral [facility].  Have placed referral to Webb Silversmith [phone]) who will connect with pt/ family to place on list for residential hospice bed.  No bed available today but will keep TOC posted on opening.  Will continue to follow along.  Expected Discharge Plan: Hospice Medical Facility Barriers to Discharge: Other (must enter comment) (await residential hospice bed)   Patient Goals and CMS Choice Patient states their goals for this hospitalization and ongoing recovery are:: residential hospice   Choice offered to / list presented to : Spouse  Expected Discharge Plan and Services Expected Discharge Plan: [place] In-house Referral: Clinical Social Work   Post Acute Care Choice: Residential Hospice Bed Living arrangements for the past 2 months: [place]                 DME Arranged: N/A DME Agency: NA                  Prior Living Arrangements/Services Living arrangements for the past 2 months: Single Family Home Lives with:: Spouse Patient language and need for interpreter reviewed:: Yes        Need for Family Participation in Patient Care: Yes (Comment) Care giver support system in place?: Yes (comment)   Criminal Activity/Legal Involvement Pertinent to Current Situation/Hospitalization: No - Comment as needed  Activities of Daily Living Home Assistive Devices/Equipment: Dentures (specify type), Eyeglasses (top and borrom) ADL Screening (condition at time of admission) Patient's cognitive ability  adequate to safely complete daily activities?: Yes Is the patient deaf or have difficulty hearing?: No Does the patient have difficulty seeing, even when wearing glasses/contacts?: No Does the patient have difficulty concentrating, remembering, or making decisions?: No Patient able to express need for assistance with ADLs?: Yes Does the patient have difficulty dressing or bathing?: No Independently performs ADLs?: Yes (appropriate for developmental age) Does the patient have difficulty walking or climbing stairs?: No Weakness of Legs: None Weakness of Arms/Hands: None  Permission Sought/Granted Permission sought to share information with : Family Supports    Share Information with NAME: Dejan Angert        Permission granted to share info w Contact Information: [phone]  Emotional Assessment Appearance:: Appears stated age Attitude/Demeanor/Rapport: Unable to Assess Affect (typically observed): Unable to Assess   Alcohol / Substance Use: Not Applicable Psych Involvement: No (comment)  Admission diagnosis:  Hyponatremia [E87.1] Acute pulmonary edema (HCC) [J81.0] Pleural effusion [J90] Hypoxia [R09.02] Acute respiratory failure with hypoxia (HCC) [J96.01] Elevated troponin I level [R77.8] Atrial fibrillation with RVR (HCC) [I48.91] Malignant neoplasm of lung, unspecified laterality, unspecified part of lung ([place]) [C34.90] Patient Active Problem List   Diagnosis Date Noted   Metastasis to brain ([place]) 09/27/2021   Palliative care by specialist 09/27/2021   PTSD (post-traumatic stress disorder)    DNR (do not resuscitate)    Acute pulmonary edema ([place])    Acute respiratory failure with hypoxia ([place]) 09/24/2021   Atrial fibrillation with RVR ([place]) 09/24/2021  Hyponatremia 09/24/2021   Acute on chronic heart failure with preserved ejection fraction (HFpEF) ([place]) 09/24/2021   Acute UTI 09/24/2021   Acute encephalopathy 09/24/2021   Acute urinary retention 09/24/2021    Pressure injury of skin 09/24/2021   Malignant neoplasm of lung ([place])    Small cell lung cancer ([place]) 03/10/2021   Right lower lobe pulmonary nodule 07/01/2020   Tobacco abuse 10/08/2018   IPF (idiopathic pulmonary fibrosis) ([place]) 04/10/2018   ILD (interstitial lung disease) ([place]) 09/16/2017   Coronary artery calcification seen on CAT scan    Leg pain 08/02/2016   SOB (shortness of breath) 08/02/2016   Persistent atrial fibrillation ([place]) 08/02/2014   Abnormal EKG 08/02/2014   Postoperative anemia due to acute blood loss 08/30/2011   Osteoarthritis of knee 08/28/2011   PCP:  Clinic, [place]:   [place], [place] - [address] [place] [address] Phone: [phone] Fax: [phone]     Social Determinants of Health (SDOH) Interventions    Readmission Risk Interventions     View : No data to display.

## 2021-09-27 NOTE — Procedures (Signed)
Extubation Procedure Note  Patient Details:   Name: [name] DOB: 07/18/1945 MRN: [id number]   Airway Documentation:    Vent end date: (not recorded) Vent end time: (not recorded)   Evaluation  O2 sats: stable throughout Complications: No apparent complications Patient did tolerate procedure well. Bilateral Breath Sounds: Diminished   Yes  Elsie Stain 09/27/2021, 10:08 AM

## 2021-09-28 DIAGNOSIS — G9340 Encephalopathy, unspecified: Secondary | ICD-10-CM | POA: Diagnosis not present

## 2021-09-28 DIAGNOSIS — Z515 Encounter for palliative care: Secondary | ICD-10-CM

## 2021-09-28 DIAGNOSIS — J9601 Acute respiratory failure with hypoxia: Secondary | ICD-10-CM | POA: Diagnosis not present

## 2021-09-28 DIAGNOSIS — I5033 Acute on chronic diastolic (congestive) heart failure: Secondary | ICD-10-CM | POA: Diagnosis not present

## 2021-09-28 DIAGNOSIS — Z66 Do not resuscitate: Secondary | ICD-10-CM | POA: Diagnosis not present

## 2021-09-28 DIAGNOSIS — C7931 Secondary malignant neoplasm of brain: Secondary | ICD-10-CM

## 2021-09-28 LAB — LEGIONELLA PNEUMOPHILA SEROGP 1 UR AG: L. pneumophila Serogp 1 Ur Ag: NEGATIVE

## 2021-09-28 MED ORDER — LORAZEPAM 2 MG/ML IJ SOLN
0.5000 mg | INTRAMUSCULAR | Status: DC | PRN
  Administered 2021-09-28 (×2): 0.5 mg via INTRAVENOUS

## 2021-09-28 MED ORDER — HYDROMORPHONE HCL 1 MG/ML IJ SOLN
0.5000 mg | INTRAMUSCULAR | Status: DC | PRN
  Administered 2021-09-28: 0.5 mg via INTRAVENOUS
  Filled 2021-09-28 (×2): qty 1

## 2021-09-28 MED ORDER — ACETAMINOPHEN 325 MG PO TABS: 650.0000 mg | ORAL_TABLET | Freq: Four times a day (QID) | ORAL | Status: AC | PRN

## 2021-09-28 MED ORDER — LORAZEPAM 2 MG/ML IJ SOLN
0.5000 mg | INTRAMUSCULAR | Status: DC | PRN
  Administered 2021-09-28: 0.5 mg via INTRAVENOUS
  Filled 2021-09-28 (×3): qty 1

## 2021-09-28 NOTE — Progress Notes (Signed)
   We received this referral from [place] and have reviewed the records. I have met with pt's wife and family to introduce hospice care and transition from hospital to the Hospice Home in [facility]. They are in agreement and do accept bed offer. Our MD has approved pt for services.  The paperwork was completed at bedside. And TOC has called for ambulance first available.   Thank you for this referral-- Webb Silversmith RN [phone]

## 2021-09-28 NOTE — TOC Transition Note (Addendum)
Transition of Care [facility]) - CM/SW Discharge Note   Patient Details  Name: [name] MRN: [id number] Date of Birth: December 14, 1945  Transition of Care [facility]) CM/SW Contact:  Servando Snare, LCSW Phone Number: 09/28/2021, 12:43 PM   Clinical Narrative:   Patient to DC to [place] via [facility] - called @ 2:05pm. Number to call report is [phone].  No further TOC needs     Final next level of care: [place] Barriers to Discharge: No Barriers Identified   Patient Goals and CMS Choice Patient states their goals for this hospitalization and ongoing recovery are:: residential hospice   Choice offered to / list presented to : NA  Discharge Placement                Patient to be transferred to facility by: PT[facility]      Discharge Plan and Services In-house Referral: Clinical Social Work   Post Acute Care Choice: Residential Hospice Bed          DME Arranged: N/A DME Agency: NA       HH Arranged: NA HH Agency: NA        Social Determinants of Health (SDOH) Interventions     Readmission Risk Interventions     View : No data to display.

## 2021-09-28 NOTE — Progress Notes (Signed)
[facility]  PROGRESS NOTE  [name] RXV:[id number] DOB: Mar 12, 1946 DOA: 09/23/2021 PCP: [facility]   Brief HPI:   76 year old male with past medical history of lung cancer, followed at [facility], 4 sessions of carbo/etoposide, last session 06/22/2021, pulmonary fibrosis, atrial fibrillation on anticoagulation with Eliquis, interstitial lung disease, coronary artery calcification, history of kidney stones, PTSD, right TKA, former smoker who was admitted on 09/23/2021 with shortness of breath, confusion, GI bleed, fatigue.  Patient was found to be in atrial fibrillation with RVR.  5/22-GI bleeding likely from diverticular bleed, CT abdomen pelvis was done however after CT patient became very altered ;transferred to ICU with worsening mental status and respiratory status, intubated, required peripheral pressors  5/23-remains encephalopathic, pressor requirement   5/24-patient was extubated, family discussed hospice option with CCM.  5/25-patient was made full comfort care only awaiting bed at residential hospice.    Subjective   Patient continues to be lethargic.   Assessment/Plan:     Acute hypoxemic respiratory failure -Secondary to acute on chronic HFpEF versus atypical pneumonia with small effusion -Patient has baseline ILD with mild pulmonary hypertension -Patient was extubated -He was treated with antibiotics and Lasix -Now transition to comfort measures  Lower GI bleed with acute blood loss anemia -Patient has a known history of AVMs, has been on Eliquis -GI was consulted; suspected to have diverticular bleed -CTA GI bleed was negative -DOAC was held -Patient has been made full comfort care only  Acute encephalopathy -Multifactorial from hypercarbia and shock with brain metastasis -Comfort measures only  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 -CT head on 09/26/2021 showed numerous brain  metastasis -Palliative care consulted; patient made full comfort care only -Awaiting bed at residential hospice  Goals of care -Palliative care following -End-of-life care -Awaiting bed at residential hospice    Medications     chlorhexidine  15 mL Mouth Rinse BID   Chlorhexidine Gluconate Cloth  6 each Topical Q0600   furosemide  40 mg Intravenous Once   mouth rinse  15 mL Mouth Rinse q12n4p   sodium chloride flush  3 mL Intravenous Q12H     Data Reviewed:   CBG:  Recent Labs  Lab 09/26/21 1938 09/27/21 0024 09/27/21 0411 09/27/21 0721 09/27/21 1139  GLUCAP 107* 107* 122* 107* 118*    SpO2: 98 % O2 Flow Rate (L/min): 2 L/min FiO2 (%): 30 %    Vitals:   09/27/21 1700 09/27/21 1800 09/27/21 1900 09/27/21 2000  BP:      Pulse: (!) 113 63 (!) 129 (!) 59  Resp: (!) 28 (!) 26 (!) 22 (!) 29  Temp:      TempSrc:      SpO2: 99% 93% 95% 98%  Weight:      Height:          Data Reviewed:  Basic Metabolic Panel: Recent Labs  Lab 09/25/21 0304 09/25/21 1818 09/25/21 1957 09/26/21 0454 09/26/21 1532 09/27/21 0243  NA 132* 129*  --  133* 133* 136  K 3.4* 4.5 4.8 4.6 4.0 4.1  CL 96* 95*  --  98 97* 100  CO2 25 26  --  26 27 27   GLUCOSE 122* 207*  --  123* 114* 107*  BUN 15 19  --  23 24* 28*  CREATININE 0.50* 0.55*  --  0.50* 0.57* 0.51*  CALCIUM 8.2* 7.3*  --  7.8* 8.2* 8.1*  MG 1.8  --   --  2.3  --  2.0  PHOS <1.0*  --  2.9 2.2*  --  1.8*    CBC: Recent Labs  Lab 09/25/21 0304 09/25/21 1645 09/25/21 1818 09/26/21 0454 09/27/21 0243  WBC 8.8 10.1 9.2 11.5* 10.1  HGB 13.7 11.7* 10.2* 13.1 11.9*  HCT 42.6 36.5* 32.7* 39.6 35.9*  MCV 79.8* 80.8 80.5 82.0 82.2  PLT 165 184 181 181 133*    LFT Recent Labs  Lab 09/26/21 0454  AST 78*  ALT 45*  ALKPHOS 255*  BILITOT 2.1*  PROT 5.4*  ALBUMIN 2.5*     Antibiotics: Anti-infectives (From admission, onward)    Start     Dose/Rate Route Frequency Ordered Stop   09/26/21 2200   cefTRIAXone (ROCEPHIN) 1 g in sodium chloride 0.9 % 100 mL IVPB  Status:  Discontinued        1 g 200 mL/hr over 30 Minutes Intravenous Every 24 hours 09/26/21 1000 09/27/21 1330   09/26/21 0930  azithromycin (ZITHROMAX) 500 mg in sodium chloride 0.9 % 250 mL IVPB  Status:  Discontinued        500 mg 250 mL/hr over 60 Minutes Intravenous Every 24 hours 09/26/21 0822 09/27/21 1330   09/25/21 0115  cefTRIAXone (ROCEPHIN) 1 g in sodium chloride 0.9 % 100 mL IVPB        1 g 200 mL/hr over 30 Minutes Intravenous Every 24 hours 09/24/21 0448 09/26/21 0139   09/24/21 0115  cefTRIAXone (ROCEPHIN) 1 g in sodium chloride 0.9 % 100 mL IVPB        1 g 200 mL/hr over 30 Minutes Intravenous  Once 09/24/21 0104 09/24/21 0154   09/24/21 0115  azithromycin (ZITHROMAX) 500 mg in sodium chloride 0.9 % 250 mL IVPB        500 mg 250 mL/hr over 60 Minutes Intravenous  Once 09/24/21 0104 09/24/21 0253        DVT prophylaxis: Comfort measures only  Code Status: DNR  Family Communication: Discussed with patient's wife at bedside   CONSULTS gastroenterology, palliative care   Objective    Physical Examination:   General: Appears lethargic Cardiovascular: S1-S2, regular, no murmur auscultated Respiratory: Bilateral rhonchi auscultated Abdomen: Abdomen soft, nontender, no organomegaly Extremities: No edema in the lower extremities Neurologic: Nonverbal, not following commands   Status is: Inpatient: End-of-life care    Pressure Injury 09/24/21 Coccyx Bilateral Stage 3 -  Full thickness tissue loss. Subcutaneous fat may be visible but bone, tendon or muscle are NOT exposed. multiple small wounds to coccyx; some possibly tunneling, ST I surrounding wounds, expanding to b (Active)  09/24/21 0400  Location: Coccyx  Location Orientation: Bilateral  Staging: Stage 3 -  Full thickness tissue loss. Subcutaneous fat may be visible but bone, tendon or muscle are NOT exposed.  Wound Description  (Comments): multiple small wounds to coccyx; some possibly tunneling, ST I surrounding wounds, expanding to bilateral buttocks  Present on Admission: Yes     Pressure Injury 09/24/21 Buttocks Bilateral Deep Tissue Pressure Injury - Purple or maroon localized area of discolored intact skin or blood-filled blister due to damage of underlying soft tissue from pressure and/or shear. DTI/purple areas noted to each (Active)  09/24/21 0400  Location: Buttocks  Location Orientation: Bilateral  Staging: Deep Tissue Pressure Injury - Purple or maroon localized area of discolored intact skin or blood-filled blister due to damage of underlying soft tissue from pressure and/or shear.  Wound Description (Comments): DTI/purple areas noted to each buttocks and one  on coccyx  Present on Admission: Yes     Pressure Injury 09/24/21 Buttocks Right Stage 2 -  Partial thickness loss of dermis presenting as a shallow open injury with a red, pink wound bed without slough. area to right buttocks open - possible combination of MASD, Pressure and shearing (Active)  09/24/21 0400  Location: Buttocks  Location Orientation: Right  Staging: Stage 2 -  Partial thickness loss of dermis presenting as a shallow open injury with a red, pink wound bed without slough.  Wound Description (Comments): area to right buttocks open - possible combination of MASD, Pressure and shearing  Present on Admission: Yes     Pressure Injury 09/24/21 Buttocks Bilateral Stage 1 -  Intact skin with non-blanchable redness of a localized area usually over a bony prominence. Large area noted containing x3 DTI's, multiple St II-St III, and a ST II shearing area (Active)  09/24/21 0400  Location: Buttocks  Location Orientation: Bilateral  Staging: Stage 1 -  Intact skin with non-blanchable redness of a localized area usually over a bony prominence.  Wound Description (Comments): Large area noted containing x3 DTI's, multiple St II-St III, and a ST II  shearing area  Present on Admission: Yes        [facility]   [facility] If 7PM-7AM, please contact night-coverage at [email or web address], Office  [phone]   09/28/2021, 11:11 AM  LOS: 4 days

## 2021-09-28 NOTE — Progress Notes (Signed)
                                                     Palliative Care Progress Note, Assessment & Plan   Patient Name: [name]       Date: 09/28/2021 DOB: 15-Oct-1945  Age: 76 y.o. MRN#: [id number] Attending Physician: Oswald Hillock, MD Primary Care Physician: [facility] Admit Date: 09/23/2021  Reason for Consultation/Follow-up: Establishing goals of care  Subjective: Patient is resting in bed, he is not awake or alert.  He appears to be in the dying process.  His wife is present at the bedside.  End-of-life signs and symptoms discussed with wife, see below.   HPI: 76 y.o. male  with past medical history of lung cancer (followed by Dr. Mindi Junker, [facility], 4 sessions of carbo/etoposide - last 06/22/21), pulmonary fibrosis, A-fib (Eliquis), interstitial lung disease, coronary artery calcification, history of kidney stones, PTSD, right TKA, and former smoker admitted on 09/23/2021 with shortness of breath, confusion, GIB, and fatigue.  Pt found to be in A-fib with RVR.    5/22 transferred to ICU with worsening mental status and respiratory status, intubated, required peripheral pressors   5/23 remains encephalopathic, pressor requirement down-trending.   5/24, pt successfully extubated. Family discussed hospice with CCM.  5-25: Patient remains on comfort measures only, awaiting bed at local residential hospice.  Continue to prioritize pain and on pain symptom management and providing support to patient and family as a unit.  Summary of counseling/coordination of care: End-of-life signs and symptoms discussed with wife who is present at the bedside.  Discussed about current pain and on pain symptom management.  Patient's wife expresses a concern that the patient is having myoclonic jerks at times.  Medication history reviewed.  He only received 1 mg of  IV morphine once earlier this morning.  We discussed about opioid rotation from morphine to IV hydromorphone.  We will also add a low-dose benzodiazepines IV Ativan to be available on an as-needed basis for comfort measures.  Awaiting hospice bed at a local hospice facility.   Questions and concerns were addressed.  PMT will continue to follow patient throughout his hospitalization.  Family was encouraged to call with any questions or concerns.  Code Status: DNR  Prognosis: < 2 weeks  Discharge Planning: Hospice facility  Recommendations/Plan: Transfer to residential hospice when bed is available  Care plan was discussed with interdisciplinary team and patient's wife            Elderly appearing gentleman resting in bed Eyes closed, not awake or alert Shallow regular breath sounds No congestion/death rattle noted No purposeful movements At present does not appear to display nonverbal gestures of distress or discomfort such as wincing, grimacing, furrowing of the brow.  Continue to monitor. Nonverbal   Palliative Assessment/Data: 20%    Total Time 50 minutes  Greater than 50%  of this time was spent counseling and coordinating care related to the above assessment and plan.  Thank you for allowing the Palliative Medicine Team to assist in the care of this patient.  Loistine Chance MD Palliative Medicine Team Team Phone # [phone]

## 2021-09-28 NOTE — Progress Notes (Signed)
Patient now on comfort care per palliative medicine.  Patient seen resting in bed.  Wife at bedside.  Discussed patient with wife she brought up fond memories.  No further recommendations from GI given comfort care plan.  Please call us back if needed.

## 2021-09-28 NOTE — Discharge Summary (Signed)
Physician Discharge Summary   Patient: [name] MRN: [id number] DOB: Dec 19, 1945  Admit date:     09/23/2021  Discharge date: 09/28/21  Discharge Physician: Oswald Hillock   PCP: [facility]   Recommendations at discharge:   Patient to be discharged to residential hospice  Discharge Diagnoses: Principal Problem:   Acute respiratory failure with hypoxia ([place]) Active Problems:   ILD (interstitial lung disease) ([place])   Small cell lung cancer ([place])   Atrial fibrillation with RVR ([place])   Hyponatremia   Acute on chronic heart failure with preserved ejection fraction (HFpEF) ([place])   Acute UTI   Acute encephalopathy   Acute urinary retention   Malignant neoplasm of lung (HCC)   Pressure injury of skin   Acute pulmonary edema (H[facility])   Metastasis to brain [facility])   Palliative care by specialist   PTSD (post-traumatic stress disorder)   DNR (do not resuscitate)  Resolved Problems:   * No resolved hospital problems. *  Hospital Course:  76 year old male with past medical history of lung cancer, followed at [facility], 4 sessions of carbo/etoposide, last session 06/22/2021, pulmonary fibrosis, atrial fibrillation on anticoagulation with Eliquis, interstitial lung disease, coronary artery calcification, history of kidney stones, PTSD, right TKA, former smoker who was admitted on 09/23/2021 with shortness of breath, confusion, GI bleed, fatigue.  Patient was found to be in atrial fibrillation with RVR.   5/22-GI bleeding likely from diverticular bleed, CT abdomen pelvis was done however after CT patient became very altered ;transferred to ICU with worsening mental status and respiratory status, intubated, required peripheral pressors   5/23-remains encephalopathic, pressor requirement    5/24-patient was extubated, family discussed hospice option with CCM.   5/25-patient was made full comfort care only and will be discharged to residential hospice.   Assessment and  Plan:  Acute hypoxemic respiratory failure -Secondary to acute on chronic HFpEF versus atypical pneumonia with small effusion -Patient has baseline ILD with mild pulmonary hypertension -Patient was extubated -He was treated with antibiotics and Lasix -Now transitioned to comfort measures   Lower GI bleed with acute blood loss anemia -Patient has a known history of AVMs, has been on Eliquis -GI was consulted; suspected to have diverticular bleed -CTA GI bleed was negative -DOAC was held -Patient has been made full comfort care only   Acute encephalopathy -Multifactorial from hypercarbia and shock with brain metastasis -Comfort measures only   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 -CT head on 09/26/2021 showed numerous brain metastasis -Palliative care consulted; patient made full comfort care only -Awaiting bed at residential hospice   Goals of care -Palliative care following -End-of-life care -Will be transferred to residential hospice        Consultants: Palliative care, critical care, gastroenterology Procedures performed: Intubation mechanical ventilation Disposition: Hospice care Diet recommendation:  Discharge Diet Orders (From admission, onward)     Start     Ordered   09/28/21 0000  Diet - low sodium heart healthy        09/28/21 1405           Regular diet DISCHARGE MEDICATION: Allergies as of 09/28/2021   No Known Allergies      Medication List     STOP taking these medications    apixaban 5 MG Tabs tablet Commonly known as: ELIQUIS   BROMALINE PO   GLUTATHIONE PO   Ofev 150 MG Caps Generic drug: Nintedanib   OVER THE COUNTER MEDICATION  OVER THE COUNTER MEDICATION   OVER THE COUNTER MEDICATION   phosphorus [id number] MG tablet Commonly known as: K PHOS NEUTRAL   vitamin C 500 MG tablet Commonly known as: ASCORBIC ACID   Zinc 50 MG Tabs       TAKE these medications    acetaminophen  325 MG tablet Commonly known as: TYLENOL Take 2 tablets (650 mg total) by mouth every 6 (six) hours as needed for mild pain (or Fever >/= 101).        Discharge Exam: Filed Weights   09/25/21 0600 09/26/21 0409 09/27/21 0352  Weight: 77.2 kg 76.5 kg 76.5 kg   Appears lethargic Heart-S1-S2 regular Lungs-clear to auscultation bilaterally  Condition at discharge: worsening  The results of significant diagnostics from this hospitalization (including imaging, microbiology, ancillary and laboratory) are listed below for reference.   Imaging Studies: DG Chest 2 View  Result Date: 09/23/2021 CLINICAL DATA:  Shortness of breath. Low oxygen saturation. History of lung cancer and pulmonary fibrosis. EXAM: CHEST - 2 VIEW COMPARISON:  Radiograph 08/18/2020, CT 07/14/2020 FINDINGS: Chronic cardiomegaly. Fiducial markers at the right lung base. There are fine interstitial opacities are increased from prior exam, particularly in the right upper lobe. Peripheral subpleural opacity consistent with interstitial lung disease. Possible small right pleural effusion. No pneumothorax. IMPRESSION: Increased fine interstitial opacities, particularly in the right upper lobe, may be pulmonary edema, and progression in pulmonary fibrosis or atypical infection. Possible small right pleural effusion. Electronically Signed   By: Keith Rake M.D.   On: 09/23/2021 23:03   DG Abd 1 View  Result Date: 09/25/2021 CLINICAL DATA:  OG tube EXAM: ABDOMEN - 1 VIEW COMPARISON:  CT 09/25/2021 FINDINGS: Esophageal tube tip overlies the gastric fundus and is directed cranial. Residual contrast in the renal collecting systems. Upper gas pattern is unobstructed IMPRESSION: Esophageal tube tip directed cranially in the region of the gastric fundus Electronically Signed   By: Donavan Foil M.D.   On: 09/25/2021 19:58   CT HEAD WO CONTRAST (5MM)  Result Date: 09/26/2021 CLINICAL DATA:  Mental status change, persistent or  worsening. EXAM: CT HEAD WITHOUT CONTRAST TECHNIQUE: Contiguous axial images were obtained from the base of the skull through the vertex without intravenous contrast. RADIATION DOSE REDUCTION: This exam was performed according to the departmental dose-optimization program which includes automated exposure control, adjustment of the mA and/or kV according to patient size and/or use of iterative reconstruction technique. COMPARISON:  Head CT 04/19/2021 (images available, report unavailable). FINDINGS: Brain: Mild generalized parenchymal atrophy. There are numerous lesions within the supratentorial and infratentorial brain compatible with intracranial metastases. The largest lesion is located within the inferomedial right parietal lobe (abutting the falx), measuring 14 mm. The lesions are subtly hyperdense, and may be hemorrhagic. No more than mild edema associated with these lesions. No demarcated cortical infarct. No extra-axial fluid collection. No midline shift. Vascular: No hyperdense vessel. Atherosclerotic calcifications. Skull: No definite focal suspicious calvarial lesion. No calvarial fracture. Sinuses/Orbits: No mass or acute finding within the imaged orbits. Mild mucosal thickening within the bilateral ethmoid sinuses. Other: 13 mm sclerotic metastasis within the right mandibular condyle. Impression #1 will be called to the ordering clinician or representative by the Radiologist Assistant, and communication documented in the PACS or [employer]. IMPRESSION: Numerous intracranial metastases within the supratentorial and infratentorial brain, measuring up to 14 mm. The lesions are subtly hyperdense, and may be hemorrhagic. The lesions have no more than mild associated edema. A brain MRI without and  with contrast is recommended for further evaluation. Mild generalized parenchymal atrophy. 13 mm sclerotic metastasis within the right mandibular condyle. Mild mucosal thickening within the bilateral ethmoid  sinuses. Electronically Signed   By: Kellie Simmering D.O.   On: 09/26/2021 12:06   CT Angio Chest PE W and/or Wo Contrast  Result Date: 09/24/2021 CLINICAL DATA:  Lung/mediastinal abscess Shortness of breath.  History of lung cancer and pulmonary fibrosis. EXAM: CT ANGIOGRAPHY CHEST WITH CONTRAST TECHNIQUE: Multidetector CT imaging of the chest was performed using the standard protocol during bolus administration of intravenous contrast. Multiplanar CT image reconstructions and MIPs were obtained to evaluate the vascular anatomy. RADIATION DOSE REDUCTION: This exam was performed according to the departmental dose-optimization program which includes automated exposure control, adjustment of the mA and/or kV according to patient size and/or use of iterative reconstruction technique. CONTRAST:  62mL OMNIPAQUE IOHEXOL 350 MG/ML SOLN COMPARISON:  Radiograph yesterday. Chest CT 06/20/2021 at [facility], images available but no report. FINDINGS: Cardiovascular: There are no filling defects within the pulmonary arteries to suggest pulmonary embolus. No aortic dissection or acute aortic findings. There is mild cardiomegaly. Mild contrast refluxing into the IVC. Trace pericardial effusion. Mediastinum/Nodes: Nonspecific right hilar adenopathy including a 19 mm infrahilar node series 5, image 61. There additional prominent and mildly enlarged right hilar lymph nodes. Scattered small mediastinal lymph nodes are not enlarged by size criteria. No esophageal wall thickening. Lungs/Pleura: Moderate right and small left pleural effusion, new from prior. There is peripheral subpleural reticulation consistent with underlying interstitial lung disease, chronic. New from prior exam diffuse ground-glass opacity throughout the right upper and lower lobes. There is also mild ground-glass opacity in the perifissural and anterior left upper lobe. There are multiple small nodules in the right lower lobe which appear to be new from  prior, for example series 6, image 69 in 63. Peripheral right lower lobe nodule with fiducial markers has increased in size currently 3.2 x 3.2 cm, previously 2.6 x 1.4 cm. Right middle lobe 8 mm nodule series 6, image 66, new. There is retained mucus within the right aspect of the trachea. Upper Abdomen: Contrast refluxing into the IVC. Nonobstructing left renal calculi. Musculoskeletal: There is marked body wall edema. 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 These are grossly stable. No vertebral body compression deformity or evidence of pathologic fracture. Review of the MIP images confirms the above findings. IMPRESSION: 1. No pulmonary embolus. 2. Moderate right and small left pleural effusions, new from prior exam. Ground-glass opacities throughout the right upper and lower lobes and to a lesser extent left upper lobe, favored to represent pulmonary edema in this setting. Possibility of atypical infection is also considered, lymphangitic spread of tumor is felt less likely. There is generalized body wall edema. 3. Right lower lobe nodule with fiducial markers has increased in size from prior exam. There are additional new pulmonary nodules in the right lower and right middle lobes, concerning for metastatic disease. Recommend continued oncologic follow-up. 4. Nonspecific right hilar adenopathy. 5. 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 Electronically Signed   By: Keith Rake M.D.   On: 09/24/2021 00:53   DG CHEST PORT 1 VIEW  Result Date: 09/25/2021 CLINICAL DATA:  OG and ET tubes EXAM: PORTABLE CHEST 1 VIEW COMPARISON:  09/25/2021, CT chest 09/24/2021 FINDINGS: Interval intubation, tip of the endotracheal tube is about 5.1 cm superior to the carina. Esophageal tube tip in the  left upper quadrant and overlies the proximal stomach. Fiducial markers right lower lobe with  faintly visible nodule. Small right-sided effusion. Cardiomegaly with mild diffuse interstitial opacity without significant change. IMPRESSION: 1. Endotracheal tube tip about 5.1 cm superior to carina 2. Esophageal tube tip overlies the gastric fundus in the left upper quadrant 3. Similar cardiomegaly and small right-sided effusion. Electronically Signed   By: Donavan Foil M.D.   On: 09/25/2021 19:57   DG Chest Port 1 View  Result Date: 09/25/2021 CLINICAL DATA:  Dyspnea. EXAM: PORTABLE CHEST 1 VIEW COMPARISON:  CT 09/23/2021 and chest radiograph 09/25/2021. FINDINGS: Stable cardiomediastinal contours. Small right pleural effusion is unchanged. Fiducial markers within the right lower lobe are again noted with adjacent lung nodule. Bilateral hazy lung opacities are not significantly changed when compared with 09/25/2021. No new findings. IMPRESSION: 1. No change in aeration to the lungs compared with previous exam. 2. Persistent right pleural effusion. Electronically Signed   By: Kerby Moors M.D.   On: 09/25/2021 18:55   DG Chest Port 1 View  Result Date: 09/25/2021 CLINICAL DATA:  Acute respiratory failure with hypoxia. EXAM: PORTABLE CHEST 1 VIEW COMPARISON:  Chest radiographs 09/23/2021 and chest CTA 09/24/2021 FINDINGS: The cardiomediastinal silhouette is unchanged. There is a persistent small right pleural effusion. Fiducial markers are again noted in the right lower lobe. Interstitial and hazy airspace opacities bilaterally are stable to slightly improved. No pneumothorax is identified. No acute osseous abnormality is seen. IMPRESSION: 1. Stable to slightly improved bilateral lung opacities which may reflect edema or infection. 2. Persistent small right pleural effusion. Electronically Signed   By: Logan Bores M.D.   On: 09/25/2021 08:15   ECHOCARDIOGRAM COMPLETE  Result Date: 09/24/2021    ECHOCARDIOGRAM REPORT   Patient Name:   TAVARUS POTEETE Date of Exam: 09/24/2021 Medical Rec #:   [id number]        Height:       74.0 in Accession #:    [id number]       Weight:       182.1 lb Date of Birth:  05-15-1945        BSA:          2.089 m Patient Age:    30 years         BP:           147/70 mmHg Patient Gender: M                HR:           85 bpm. Exam Location:  Inpatient Procedure: 2D Echo, Color Doppler and Cardiac Doppler Indications:    Congestive heart failure  History:        Patient has prior history of Echocardiogram examinations, most                 recent 08/21/2016. Arrythmias:Atrial Fibrillation.  Sonographer:    Joette Catching RCS Referring Phys: [phone] [place]  1. Left ventricular ejection fraction, by estimation, is 60 to 65%. The left ventricle has normal function. The left ventricle has no regional wall motion abnormalities. There is moderate asymmetric left ventricular hypertrophy of the septal segment. Left ventricular diastolic function could not be evaluated.  2. Right ventricular systolic function is mildly reduced. The right ventricular size is normal. Mildly increased right ventricular wall thickness. There is mildly elevated pulmonary artery systolic pressure.  3. Left atrial size was moderately dilated.  4. Right atrial size was moderately  dilated.  5. The mitral valve is normal in structure. Mild mitral valve regurgitation.  6. Tricuspid valve regurgitation is mild to moderate.  7. The aortic valve is tricuspid. Aortic valve regurgitation is not visualized. No aortic stenosis is present.  8. The inferior vena cava is normal in size with greater than 50% respiratory variability, suggesting right atrial pressure of 3 mmHg. Comparison(s): Consider evaluation for infiltrative cardiomyopathy (e.g. amyloidosis). FINDINGS  Left Ventricle: Left ventricular ejection fraction, by estimation, is 60 to 65%. The left ventricle has normal function. The left ventricle has no regional wall motion abnormalities. The left ventricular internal cavity size was normal in  size. There is  moderate asymmetric left ventricular hypertrophy of the septal segment. Left ventricular diastolic function could not be evaluated due to atrial fibrillation. Left ventricular diastolic function could not be evaluated. Right Ventricle: The right ventricular size is normal. Mildly increased right ventricular wall thickness. Right ventricular systolic function is mildly reduced. There is mildly elevated pulmonary artery systolic pressure. The tricuspid regurgitant velocity is 3.02 m/s, and with an assumed right atrial pressure of 3 mmHg, the estimated right ventricular systolic pressure is 53.6 mmHg. Left Atrium: Left atrial size was moderately dilated. Right Atrium: Right atrial size was moderately dilated. Pericardium: There is no evidence of pericardial effusion. Mitral Valve: The mitral valve is normal in structure. Mild mitral valve regurgitation, with centrally-directed jet. Tricuspid Valve: The tricuspid valve is normal in structure. Tricuspid valve regurgitation is mild to moderate. Aortic Valve: The aortic valve is tricuspid. Aortic valve regurgitation is not visualized. No aortic stenosis is present. Aortic valve mean gradient measures 5.0 mmHg. Aortic valve peak gradient measures 7.8 mmHg. Aortic valve area, by VTI measures 3.57 cm. Pulmonic Valve: The pulmonic valve was normal in structure. Pulmonic valve regurgitation is not visualized. Aorta: The aortic root and ascending aorta are structurally normal, with no evidence of dilitation. Venous: The inferior vena cava is normal in size with greater than 50% respiratory variability, suggesting right atrial pressure of 3 mmHg. IAS/Shunts: No atrial level shunt detected by color flow Doppler.  LEFT VENTRICLE PLAX 2D LVIDd:         4.00 cm     Diastology LVIDs:         2.80 cm     LV e' medial:    5.55 cm/s LV PW:         1.60 cm     LV E/e' medial:  18.9 LV IVS:        2.10 cm     LV e' lateral:   7.94 cm/s LVOT diam:     2.40 cm     LV E/e'  lateral: 13.2 LV SV:         84 LV SV Index:   40 LVOT Area:     4.52 cm  LV Volumes (MOD) LV vol d, MOD A2C: 55.5 ml LV vol d, MOD A4C: 47.4 ml LV vol s, MOD A2C: 23.3 ml LV vol s, MOD A4C: 23.0 ml LV SV MOD A2C:     32.2 ml LV SV MOD A4C:     47.4 ml LV SV MOD BP:      29.3 ml RIGHT VENTRICLE RV Basal diam:  3.80 cm RV Mid diam:    1.90 cm TAPSE (M-mode): 1.6 cm LEFT ATRIUM             Index        RIGHT ATRIUM  Index LA diam:        4.00 cm 1.92 cm/m   RA Area:     28.30 cm LA Vol (A2C):   94.5 ml 45.25 ml/m  RA Volume:   105.00 ml 50.27 ml/m LA Vol (A4C):   80.3 ml 38.45 ml/m LA Biplane Vol: 90.2 ml 43.19 ml/m  AORTIC VALVE                     PULMONIC VALVE AV Area (Vmax):    3.85 cm      PV Vmax:       1.05 m/s AV Area (Vmean):   3.86 cm      PV Peak grad:  4.4 mmHg AV Area (VTI):     3.57 cm AV Vmax:           140.00 cm/s AV Vmean:          109.000 cm/s AV VTI:            0.236 m AV Peak Grad:      7.8 mmHg AV Mean Grad:      5.0 mmHg LVOT Vmax:         119.00 cm/s LVOT Vmean:        93.000 cm/s LVOT VTI:          0.186 m LVOT/AV VTI ratio: 0.79  AORTA Ao Root diam: 3.50 cm Ao Asc diam:  3.50 cm MITRAL VALVE                TRICUSPID VALVE MV Area (PHT): 10.84 cm    TR Peak grad:   36.5 mmHg MV Decel Time: 70 msec      TR Vmax:        302.00 cm/s MV E velocity: 105.00 cm/s                             SHUNTS                             Systemic VTI:  0.19 m                             Systemic Diam: 2.40 cm Dani Gobble Croitoru MD Electronically signed by Sanda Klein MD Signature Date/Time: 09/24/2021/1:01:06 PM    Final    CT ANGIO GI BLEED  Result Date: 09/25/2021 CLINICAL DATA:  Lower GI bleed.  Severe blood loss rectally. EXAM: CTA ABDOMEN AND PELVIS WITHOUT AND WITH CONTRAST TECHNIQUE: Multidetector CT imaging of the abdomen and pelvis was performed using the standard protocol during bolus administration of intravenous contrast. Multiplanar reconstructed images and MIPs were obtained  and reviewed to evaluate the vascular anatomy. RADIATION DOSE REDUCTION: This exam was performed according to the departmental dose-optimization program which includes automated exposure control, adjustment of the mA and/or kV according to patient size and/or use of iterative reconstruction technique. CONTRAST:  165mL OMNIPAQUE IOHEXOL 350 MG/ML SOLN COMPARISON:  CT chest 09/23/2021. CT chest abdomen and pelvis 06/20/2021. FINDINGS: VASCULAR Aorta: Mild diffuse aortic calcification. No aneurysm or dissection. Celiac: Patent without evidence of aneurysm, dissection, vasculitis or significant stenosis. SMA: Patent without evidence of aneurysm, dissection, vasculitis or significant stenosis. Renals: Both renal arteries are patent without evidence of aneurysm, dissection, vasculitis, fibromuscular dysplasia or significant stenosis. IMA: Patent without evidence of aneurysm, dissection, vasculitis or significant stenosis.  Inflow: Patent without evidence of aneurysm, dissection, vasculitis or significant stenosis. Proximal Outflow: Bilateral common femoral and visualized portions of the superficial and profunda femoral arteries are patent without evidence of aneurysm, dissection, vasculitis or significant stenosis. Veins: No obvious venous abnormality within the limitations of this arterial phase study. Review of the MIP images confirms the above findings. NON-VASCULAR Lower chest: Moderate right pleural effusion. Cardiac enlargement with small pericardial effusion. Emphysematous changes with interstitial thickening and honeycomb changes in the lung bases. This could represent edema or chronic fibrosis. Rounded masslike opacity in the right lung base measuring 3.5 cm diameter. This is likely a neoplastic mass lesion. This lesion has enlarged since the previous study from 06/20/2021. Hepatobiliary: Multiple circumscribed low-attenuation nonenhancing lesions are demonstrated throughout the liver. These appear to be  increasing since prior studies and likely represent metastatic disease. Cholelithiasis without inflammatory change. Pancreas: Unremarkable. No pancreatic ductal dilatation or surrounding inflammatory changes. Spleen: Normal in size without focal abnormality. Adrenals/Urinary Tract: No adrenal gland nodules. Kidneys are symmetrical. Multiple bilateral intrarenal stones. No hydronephrosis or hydroureter. Bladder is decompressed with a Foley catheter. Stomach/Bowel: Stomach, small bowel, and colon are not abnormally distended. No discrete wall thickening is appreciated. No intraluminal contrast material is identified. A focus of gastrointestinal bleeding is not identified on this examination. Lymphatic: No significant lymphadenopathy. Reproductive: Prostate calcification without enlargement. Other: Small left inguinal hernia containing fat. No free air or free fluid in the abdomen. Diffuse soft tissue edema. Musculoskeletal: Multiple sclerotic bone lesions throughout the visualized skeleton consistent with diffuse skeletal metastasis. Postoperative changes in the lower lumbar spine. IMPRESSION: VASCULAR Aortic atherosclerosis. No significant vascular disease. No site of gastrointestinal bleeding is identified. NON-VASCULAR 1. Mass in the right lower lung is probably neoplastic. Moderate right pleural effusion with basilar atelectasis. Interstitial and airspace infiltrates may indicate edema or multifocal pneumonia. 2. Enlarging multiple lesions in the liver likely representing metastatic disease. 3. Diffuse bone metastasis. 4. Small left inguinal hernia containing fat. 5. Cholelithiasis. 6. Nonobstructing renal stones bilaterally. Electronically Signed   By: Lucienne Capers M.D.   On: 09/25/2021 18:36    Microbiology: Results for orders placed or performed during the hospital encounter of 09/23/21  Resp Panel by RT-PCR (Flu A&B, Covid) Nasopharyngeal Swab     Status: None   Collection Time: 09/23/21 11:18 PM    Specimen: Nasopharyngeal Swab; Nasopharyngeal(NP) swabs in vial transport medium  Result Value Ref Range Status   SARS Coronavirus 2 by RT PCR NEGATIVE NEGATIVE Final    Comment: (NOTE) SARS-CoV-2 target nucleic acids are NOT DETECTED.  The SARS-CoV-2 RNA is generally detectable in upper respiratory specimens during the acute phase of infection. The lowest concentration of SARS-CoV-2 viral copies this assay can detect is 138 copies/mL. A negative result does not preclude SARS-Cov-2 infection and should not be used as the sole basis for treatment or other patient management decisions. A negative result may occur with  improper specimen collection/handling, submission of specimen other than nasopharyngeal swab, presence of viral mutation(s) within the areas targeted by this assay, and inadequate number of viral copies(<138 copies/mL). A negative result must be combined with clinical observations, patient history, and epidemiological information. The expected result is Negative.  Fact Sheet for Patients:  EntrepreneurPulse.com.au  Fact Sheet for Healthcare Providers:  IncredibleEmployment.be  This test is no t yet approved or cleared by the [place] FDA and  has been authorized for detection and/or diagnosis of SARS-CoV-2 by FDA under an Emergency Use Authorization (EUA). This EUA will  remain  in effect (meaning this test can be used) for the duration of the COVID-19 declaration under Section 564(b)(1) of the Act, 21 U.S.C.section 360bbb-3(b)(1), unless the authorization is terminated  or revoked sooner.       Influenza A by PCR NEGATIVE NEGATIVE Final   Influenza B by PCR NEGATIVE NEGATIVE Final    Comment: (NOTE) The Xpert Xpress SARS-CoV-2/FLU/RSV plus assay is intended as an aid in the diagnosis of influenza from Nasopharyngeal swab specimens and should not be used as a sole basis for treatment. Nasal washings and aspirates are  unacceptable for Xpert Xpress SARS-CoV-2/FLU/RSV testing.  Fact Sheet for Patients: EntrepreneurPulse.com.au  Fact Sheet for Healthcare Providers: IncredibleEmployment.be  This test is not yet approved or cleared by the [place] FDA and has been authorized for detection and/or diagnosis of SARS-CoV-2 by FDA under an Emergency Use Authorization (EUA). This EUA will remain in effect (meaning this test can be used) for the duration of the COVID-19 declaration under Section 564(b)(1) of the Act, 21 U.S.C. section 360bbb-3(b)(1), unless the authorization is terminated or revoked.  Performed at [facility], [address] [place], [place] [address]   MRSA Next Gen by PCR, Nasal     Status: None   Collection Time: 09/24/21  3:26 AM   Specimen: Nasal Mucosa; Nasal Swab  Result Value Ref Range Status   MRSA by PCR Next Gen NOT DETECTED NOT DETECTED Final    Comment: (NOTE) The GeneXpert MRSA Assay (FDA approved for NASAL specimens only), is one component of a comprehensive MRSA colonization surveillance program. It is not intended to diagnose MRSA infection nor to guide or monitor treatment for MRSA infections. Test performance is not FDA approved in patients less than 28 years old. Performed at [facility], [place] [address], [place], [place] [address]   Urine Culture     Status: None   Collection Time: 09/24/21 11:25 AM   Specimen: In/Out Cath Urine  Result Value Ref Range Status   Specimen Description   Final    IN/OUT CATH URINE Performed at [facility] [address], [place], [place] [address]    Special Requests   Final    NONE Performed at [facility], [place] [address] [place], [place] [address]    Culture   Final    NO GROWTH Performed at [facility], [place] [address] [place], [place] [address]    Report Status 09/25/2021 FINAL  Final  Culture, blood  (Routine X 2) w Reflex to ID Panel     Status: None (Preliminary result)   Collection Time: 09/26/21  7:46 AM   Specimen: BLOOD LEFT FOREARM  Result Value Ref Range Status   Specimen Description   Final    BLOOD LEFT FOREARM Performed at [place] [address] [place], [place] [address]    Special Requests   Final    BOTTLES DRAWN AEROBIC AND ANAEROBIC Blood Culture adequate volume Performed at [place] [address] [place],  [address]    Culture   Final    NO GROWTH 2 DAYS Performed at [place] [address], [place],  [address]    Report Status PENDING  Incomplete  Culture, blood (Routine X 2) w Reflex to ID Panel     Status: None (Preliminary result)   Collection Time: 09/26/21  7:55 AM   Specimen: BLOOD RIGHT HAND  Result Value  Ref Range Status   Specimen Description   Final    BLOOD RIGHT HAND Performed at [place] [address] [place], [place] [address]    Special Requests   Final    BOTTLES DRAWN AEROBIC AND ANAEROBIC Blood Culture adequate volume Performed at [facility] [address], [place], [place] [address]    Culture   Final    NO GROWTH 2 DAYS Performed at [place] [facility] [address], [place], [place] [address]    Report Status PENDING  Incomplete  Culture, Respiratory w Gram Stain     Status: None (Preliminary result)   Collection Time: 09/26/21  8:50 AM   Specimen: Tracheal Aspirate  Result Value Ref Range Status   Specimen Description   Final    TRACHEAL ASPIRATE Performed at [facility] [address] [place], [place] [address]    Special Requests   Final    NONE Performed at [facility], [place] [address] [place], [place] [address]    Gram Stain   Final    ABUNDANT WBC PRESENT,BOTH PMN AND MONONUCLEAR RARE BUDDING YEAST SEEN    Culture   Final    FEW YEAST CULTURE  REINCUBATED FOR BETTER GROWTH Performed at [facility], [place] [address] [place], [place] [address]    Report Status PENDING  Incomplete    Labs: CBC: Recent Labs  Lab 09/25/21 0304 09/25/21 1645 09/25/21 1818 09/26/21 0454 09/27/21 0243  WBC 8.8 10.1 9.2 11.5* 10.1  HGB 13.7 11.7* 10.2* 13.1 11.9*  HCT 42.6 36.5* 32.7* 39.6 35.9*  MCV 79.8* 80.8 80.5 82.0 82.2  PLT 165 184 181 181 449*   Basic Metabolic Panel: Recent Labs  Lab 09/25/21 0304 09/25/21 1818 09/25/21 1957 09/26/21 0454 09/26/21 1532 09/27/21 0243  NA 132* 129*  --  133* 133* 136  K 3.4* 4.5 4.8 4.6 4.0 4.1  CL 96* 95*  --  98 97* 100  CO2 25 26  --  26 27 27   GLUCOSE 122* 207*  --  123* 114* 107*  BUN 15 19  --  23 24* 28*  CREATININE 0.50* 0.55*  --  0.50* 0.57* 0.51*  CALCIUM 8.2* 7.3*  --  7.8* 8.2* 8.1*  MG 1.8  --   --  2.3  --  2.0  PHOS <1.0*  --  2.9 2.2*  --  1.8*   Liver Function Tests: Recent Labs  Lab 09/26/21 0454  AST 78*  ALT 45*  ALKPHOS 255*  BILITOT 2.1*  PROT 5.4*  ALBUMIN 2.5*   CBG: Recent Labs  Lab 09/26/21 1938 09/27/21 0024 09/27/21 0411 09/27/21 0721 09/27/21 1139  GLUCAP 107* 107* 122* 107* 118*    Discharge time spent: less than 30 minutes.  Signed: Oswald Hillock, MD [facility] 09/28/2021

## 2021-09-29 LAB — CULTURE, RESPIRATORY W GRAM STAIN

## 2021-10-01 LAB — CULTURE, BLOOD (ROUTINE X 2)
Culture: NO GROWTH
Culture: NO GROWTH
Special Requests: ADEQUATE
Special Requests: ADEQUATE

## 2021-10-04 ENCOUNTER — Telehealth: Payer: Self-pay | Admitting: Pulmonary Disease

## 2021-10-04 NOTE — Telephone Encounter (Signed)
Fax received this morning from [place] at [facility].  Patient passed 10-04-2021 at 0547.

## 2021-10-05 DEATH — deceased
